# Patient Record
Sex: Male | Born: 1955 | Race: White | Hispanic: No | Marital: Married | Smoking: Former smoker
Health system: Southern US, Community
[De-identification: ages and names within clinical notes are randomized; demographics above are authoritative.]

## PROBLEM LIST (undated history)

## (undated) DIAGNOSIS — I429 Cardiomyopathy, unspecified: Secondary | ICD-10-CM

## (undated) DIAGNOSIS — I480 Paroxysmal atrial fibrillation: Secondary | ICD-10-CM

## (undated) DIAGNOSIS — E785 Hyperlipidemia, unspecified: Secondary | ICD-10-CM

## (undated) DIAGNOSIS — I714 Abdominal aortic aneurysm, without rupture, unspecified: Secondary | ICD-10-CM

## (undated) DIAGNOSIS — C3490 Malignant neoplasm of unspecified part of unspecified bronchus or lung: Secondary | ICD-10-CM

## (undated) DIAGNOSIS — I639 Cerebral infarction, unspecified: Secondary | ICD-10-CM

## (undated) DIAGNOSIS — J449 Chronic obstructive pulmonary disease, unspecified: Secondary | ICD-10-CM

## (undated) HISTORY — DX: Paroxysmal atrial fibrillation: I48.0

## (undated) HISTORY — PX: BRONCHOSCOPY: SUR163

## (undated) HISTORY — DX: Cardiomyopathy, unspecified: I42.9

## (undated) HISTORY — DX: Malignant neoplasm of unspecified part of unspecified bronchus or lung: C34.90

---

## 1977-12-15 HISTORY — PX: FINGER SURGERY: SHX640

## 2010-03-11 ENCOUNTER — Emergency Department (HOSPITAL_COMMUNITY): Admission: EM | Admit: 2010-03-11 | Discharge: 2010-03-11 | Payer: Self-pay | Admitting: Emergency Medicine

## 2011-03-10 LAB — URINALYSIS, ROUTINE W REFLEX MICROSCOPIC
Ketones, ur: NEGATIVE mg/dL
Protein, ur: NEGATIVE mg/dL
Specific Gravity, Urine: 1.005 (ref 1.005–1.030)
Urobilinogen, UA: 0.2 mg/dL (ref 0.0–1.0)

## 2012-08-12 NOTE — H&P (Signed)
  NTS SOAP Note  Vital Signs:  Vitals as of: 08/12/2012: Systolic 131: Diastolic 86: Heart Rate 73: Temp 98.80F: Height 72ft 5in: Weight 143Lbs 0 Ounces: BMI 24  BMI : 23.8 kg/m2  Subjective: This 62 Years 60 Months old Male presents for screening TCS.  Never has had a TCS.  No gi complaints.  No family h/o colon cancer.   Review of Symptoms:  Constitutional:unremarkable   Head:unremarkable    Eyes:unremarkable   Nose/Mouth/Throat:unremarkable Cardiovascular:  unremarkable   Respiratory:unremarkable   Gastrointestinal:  unremarkable   Genitourinary:unremarkable     Musculoskeletal:unremarkable   Skin:unremarkable Hematolgic/Lymphatic:unremarkable     Allergic/Immunologic:unremarkable     Past Medical History:    Reviewed   Past Medical History  Surgical History: unremarkable Medical Problems:  Asthma, CVA, high cholesterol Allergies: nkda Medications: plavix, lipitor, folic acid, spiriva   Social History:Reviewed  Social History  Preferred Language: English ([place]) Race:  White Ethnicity: Not Hispanic / Latino Age: 56 Years 7 Months Marital Status:  M Alcohol: occassional Recreational drug(s):  No   Smoking Status: Current every day smoker reviewed on 08/12/2012 Started Date: 12/15/1976 Packs per day: 1.00   Family History:  Reviewed   Family History  Is there a family history of:No family h/o colon cancer    Objective Information: General:  Well appearing, well nourished in no distress. Throat:  no erythema, exudates or lesions. Heart:  RRR, no murmur Lungs:    CTA bilaterally, no wheezes, rhonchi, rales.  Breathing unlabored. Abdomen:Soft, NT/ND, no HSM, no masses.   deferred to procedure  Assessment:Need for screening TCS  Diagnosis &amp; Procedure: DiagnosisCode: V76.51, ProcedureCode: [address],    Plan:Scheduled for TCS on 08/31/12.   Patient Education:Alternative  treatments to surgery were discussed with patient (and family).  Risks and benefits  of procedure were fully explained to the patient (and family) who gave informed consent. Patient/family questions were addressed.  Follow-up:Pending Surgery

## 2012-08-17 ENCOUNTER — Encounter (HOSPITAL_COMMUNITY): Payer: Self-pay | Admitting: Pharmacy Technician

## 2012-08-25 MED ORDER — LIDOCAINE HCL (PF) 1 % IJ SOLN
INTRAMUSCULAR | Status: AC
  Filled 2012-08-25: qty 5

## 2012-08-25 MED ORDER — PROPOFOL 10 MG/ML IV EMUL
INTRAVENOUS | Status: AC
  Filled 2012-08-25: qty 20

## 2012-08-31 ENCOUNTER — Encounter (HOSPITAL_COMMUNITY): Payer: Self-pay

## 2012-08-31 ENCOUNTER — Encounter (HOSPITAL_COMMUNITY)
Admission: RE | Disposition: A | Discharge status: Home / Self Care | Payer: Self-pay | Source: Ambulatory Visit | Attending: General Surgery

## 2012-08-31 ENCOUNTER — Ambulatory Visit (HOSPITAL_COMMUNITY)
Admission: RE | Admit: 2012-08-31 | Discharge: 2012-08-31 | Disposition: A | Discharge status: Home / Self Care | Payer: Medicare HMO | Source: Ambulatory Visit | Attending: General Surgery | Admitting: General Surgery

## 2012-08-31 DIAGNOSIS — D126 Benign neoplasm of colon, unspecified: Secondary | ICD-10-CM | POA: Insufficient documentation

## 2012-08-31 DIAGNOSIS — D129 Benign neoplasm of anus and anal canal: Secondary | ICD-10-CM | POA: Insufficient documentation

## 2012-08-31 DIAGNOSIS — D128 Benign neoplasm of rectum: Secondary | ICD-10-CM | POA: Insufficient documentation

## 2012-08-31 DIAGNOSIS — E7800 Pure hypercholesterolemia, unspecified: Secondary | ICD-10-CM | POA: Insufficient documentation

## 2012-08-31 DIAGNOSIS — K5730 Diverticulosis of large intestine without perforation or abscess without bleeding: Secondary | ICD-10-CM | POA: Insufficient documentation

## 2012-08-31 DIAGNOSIS — Z1211 Encounter for screening for malignant neoplasm of colon: Secondary | ICD-10-CM | POA: Insufficient documentation

## 2012-08-31 HISTORY — DX: Chronic obstructive pulmonary disease, unspecified: J44.9

## 2012-08-31 HISTORY — DX: Cerebral infarction, unspecified: I63.9

## 2012-08-31 HISTORY — PX: COLONOSCOPY: SHX5424

## 2012-08-31 SURGERY — COLONOSCOPY
Anesthesia: Moderate Sedation

## 2012-08-31 MED ORDER — MIDAZOLAM HCL 5 MG/5ML IJ SOLN
INTRAMUSCULAR | Status: AC
  Filled 2012-08-31: qty 10

## 2012-08-31 MED ORDER — MEPERIDINE HCL 50 MG/ML IJ SOLN
INTRAMUSCULAR | Status: AC
  Filled 2012-08-31: qty 2

## 2012-08-31 MED ORDER — STERILE WATER FOR IRRIGATION IR SOLN
Status: DC | PRN
  Administered 2012-08-31: 08:00:00

## 2012-08-31 MED ORDER — MIDAZOLAM HCL 5 MG/5ML IJ SOLN
INTRAMUSCULAR | Status: DC | PRN
  Administered 2012-08-31 (×2): 1 mg via INTRAVENOUS
  Administered 2012-08-31: 4 mg via INTRAVENOUS

## 2012-08-31 MED ORDER — MEPERIDINE HCL 25 MG/ML IJ SOLN
INTRAMUSCULAR | Status: DC | PRN
  Administered 2012-08-31: 50 mg via INTRAVENOUS

## 2012-08-31 NOTE — Op Note (Signed)
[facility] [address] [place], [address]   COLONOSCOPY PROCEDURE REPORT  PATIENT: Tyler Aguilar, Tyler Aguilar  MR#: [id number] BIRTHDATE: 05-19-1956 , 56  yrs. old GENDER: Male ENDOSCOPIST: Franky Macho, MD REFERRED [id number] PROCEDURE DATE:  08/31/2012 PROCEDURE:   Colonoscopy with snare polypectomy ASA CLASS:   Class II INDICATIONS:average risk patient for colon cancer. MEDICATIONS: Versed 6 mg IV and Demerol 50 mg IV  DESCRIPTION OF PROCEDURE:   After the risks benefits and alternatives of the procedure were thoroughly explained, informed consent was obtained.  A digital rectal exam revealed no abnormalities of the rectum.   The EC-[id number] ([id number])  endoscope was introduced through the anus and advanced to the cecum, which was identified by both the appendix and ileocecal valve. No adverse events experienced.   The quality of the prep was adequate, using MoviPrep  The instrument was then slowly withdrawn as the colon was fully examined.      COLON FINDINGS: Multiple medium sized polypoid shaped semi-pedunculated polyps were found at the cecum, in the sigmoid colon, and rectum.  Polypectomy was performed using hot snare.  All resections were complete and all polyp tissue was completely retrieved.   Diverticulosis  was found in the sigmoid colon.  The openings were medium sized.  Retroflexed views revealed no abnormalities except for rectal polyp. The time to cecum=4 minutes 0 seconds.  Withdrawal time=28 minutes 0 seconds.  The scope was withdrawn and the procedure completed. COMPLICATIONS: There were no complications.  ENDOSCOPIC IMPRESSION: 1.   Multiple medium sized semi-pedunculated polyps were found at the cecum, in the sigmoid colon, and rectum; Polypectomy was performed using hot snare 2.   Diverticulum in the sigmoid colon  RECOMMENDATIONS: 1.  await pathology results 2.  repeat Colonoscopy in 3 years.   eSigned:  Franky Macho, MD 08/31/2012  9:28 AM   cc:   PATIENT NAME:  Tyler Aguilar, Tyler Aguilar MR#: [id number]

## 2012-08-31 NOTE — Interval H&P Note (Signed)
History and Physical Interval Note:  08/31/2012 8:37 AM  Tyler Aguilar  has presented today for surgery, with the diagnosis of Special screening for malignant neoplasms, colon  The various methods of treatment have been discussed with the patient and family. After consideration of risks, benefits and other options for treatment, the patient has consented to  Procedure(s) (LRB) with comments: COLONOSCOPY (N/A) as a surgical intervention .  The patient's history has been reviewed, patient examined, no change in status, stable for surgery.  I have reviewed the patient's chart and labs.  Questions were answered to the patient's satisfaction.     [name]

## 2012-09-03 ENCOUNTER — Encounter (HOSPITAL_COMMUNITY): Payer: Self-pay | Admitting: General Surgery

## 2013-04-20 DIAGNOSIS — I639 Cerebral infarction, unspecified: Secondary | ICD-10-CM | POA: Insufficient documentation

## 2013-04-20 DIAGNOSIS — R252 Cramp and spasm: Secondary | ICD-10-CM | POA: Insufficient documentation

## 2014-04-28 DIAGNOSIS — I70209 Unspecified atherosclerosis of native arteries of extremities, unspecified extremity: Secondary | ICD-10-CM | POA: Insufficient documentation

## 2014-04-28 DIAGNOSIS — Z720 Tobacco use: Secondary | ICD-10-CM | POA: Insufficient documentation

## 2014-04-28 DIAGNOSIS — I714 Abdominal aortic aneurysm, without rupture, unspecified: Secondary | ICD-10-CM | POA: Insufficient documentation

## 2015-05-04 DIAGNOSIS — I6529 Occlusion and stenosis of unspecified carotid artery: Secondary | ICD-10-CM | POA: Insufficient documentation

## 2015-06-05 DIAGNOSIS — R251 Tremor, unspecified: Secondary | ICD-10-CM | POA: Insufficient documentation

## 2016-01-02 ENCOUNTER — Encounter (HOSPITAL_COMMUNITY): Payer: Self-pay | Admitting: *Deleted

## 2016-01-02 ENCOUNTER — Inpatient Hospital Stay (HOSPITAL_COMMUNITY)
Admission: EM | Admit: 2016-01-02 | Discharge: 2016-01-04 | DRG: 190 | Disposition: A | Discharge status: Home / Self Care | Payer: Medicare PPO | Attending: Internal Medicine | Admitting: Internal Medicine

## 2016-01-02 ENCOUNTER — Emergency Department (HOSPITAL_COMMUNITY): Payer: Medicare PPO

## 2016-01-02 DIAGNOSIS — I714 Abdominal aortic aneurysm, without rupture: Secondary | ICD-10-CM | POA: Diagnosis present

## 2016-01-02 DIAGNOSIS — R748 Abnormal levels of other serum enzymes: Secondary | ICD-10-CM | POA: Diagnosis present

## 2016-01-02 DIAGNOSIS — C3490 Malignant neoplasm of unspecified part of unspecified bronchus or lung: Secondary | ICD-10-CM | POA: Diagnosis present

## 2016-01-02 DIAGNOSIS — E785 Hyperlipidemia, unspecified: Secondary | ICD-10-CM | POA: Diagnosis present

## 2016-01-02 DIAGNOSIS — Z7902 Long term (current) use of antithrombotics/antiplatelets: Secondary | ICD-10-CM

## 2016-01-02 DIAGNOSIS — J441 Chronic obstructive pulmonary disease with (acute) exacerbation: Principal | ICD-10-CM | POA: Diagnosis present

## 2016-01-02 DIAGNOSIS — F419 Anxiety disorder, unspecified: Secondary | ICD-10-CM | POA: Diagnosis present

## 2016-01-02 DIAGNOSIS — R7989 Other specified abnormal findings of blood chemistry: Secondary | ICD-10-CM | POA: Diagnosis present

## 2016-01-02 DIAGNOSIS — J9601 Acute respiratory failure with hypoxia: Secondary | ICD-10-CM | POA: Diagnosis present

## 2016-01-02 DIAGNOSIS — J9859 Other diseases of mediastinum, not elsewhere classified: Secondary | ICD-10-CM | POA: Diagnosis present

## 2016-01-02 DIAGNOSIS — R590 Localized enlarged lymph nodes: Secondary | ICD-10-CM | POA: Diagnosis present

## 2016-01-02 DIAGNOSIS — R0602 Shortness of breath: Secondary | ICD-10-CM

## 2016-01-02 DIAGNOSIS — R778 Other specified abnormalities of plasma proteins: Secondary | ICD-10-CM | POA: Diagnosis present

## 2016-01-02 DIAGNOSIS — I739 Peripheral vascular disease, unspecified: Secondary | ICD-10-CM | POA: Diagnosis present

## 2016-01-02 DIAGNOSIS — F17200 Nicotine dependence, unspecified, uncomplicated: Secondary | ICD-10-CM | POA: Diagnosis present

## 2016-01-02 DIAGNOSIS — Z79899 Other long term (current) drug therapy: Secondary | ICD-10-CM

## 2016-01-02 DIAGNOSIS — Z8673 Personal history of transient ischemic attack (TIA), and cerebral infarction without residual deficits: Secondary | ICD-10-CM

## 2016-01-02 DIAGNOSIS — X58XXXA Exposure to other specified factors, initial encounter: Secondary | ICD-10-CM | POA: Diagnosis present

## 2016-01-02 DIAGNOSIS — I4510 Unspecified right bundle-branch block: Secondary | ICD-10-CM | POA: Diagnosis present

## 2016-01-02 DIAGNOSIS — T17990A Other foreign object in respiratory tract, part unspecified in causing asphyxiation, initial encounter: Secondary | ICD-10-CM | POA: Diagnosis present

## 2016-01-02 HISTORY — DX: Abdominal aortic aneurysm, without rupture: I71.4

## 2016-01-02 HISTORY — DX: Abdominal aortic aneurysm, without rupture, unspecified: I71.40

## 2016-01-02 HISTORY — DX: Hyperlipidemia, unspecified: E78.5

## 2016-01-02 LAB — CBC WITH DIFFERENTIAL/PLATELET
BASOS ABS: 0 10*3/uL (ref 0.0–0.1)
Basophils Relative: 0 %
EOS PCT: 3 %
Eosinophils Absolute: 0.3 10*3/uL (ref 0.0–0.7)
HEMATOCRIT: 40.3 % (ref 39.0–52.0)
Hemoglobin: 13.4 g/dL (ref 13.0–17.0)
LYMPHS PCT: 28 %
Lymphs Abs: 2.8 10*3/uL (ref 0.7–4.0)
MCH: 32.2 pg (ref 26.0–34.0)
MCHC: 33.3 g/dL (ref 30.0–36.0)
MCV: 96.9 fL (ref 78.0–100.0)
Monocytes Absolute: 1 10*3/uL (ref 0.1–1.0)
Monocytes Relative: 10 %
NEUTROS ABS: 6.2 10*3/uL (ref 1.7–7.7)
Neutrophils Relative %: 59 %
PLATELETS: 268 10*3/uL (ref 150–400)
RBC: 4.16 MIL/uL — ABNORMAL LOW (ref 4.22–5.81)
RDW: 13 % (ref 11.5–15.5)
WBC: 10.3 10*3/uL (ref 4.0–10.5)

## 2016-01-02 MED ORDER — IPRATROPIUM-ALBUTEROL 0.5-2.5 (3) MG/3ML IN SOLN
3.0000 mL | Freq: Once | RESPIRATORY_TRACT | Status: AC
  Administered 2016-01-03: 3 mL via RESPIRATORY_TRACT
  Filled 2016-01-02: qty 3

## 2016-01-02 MED ORDER — PREDNISONE 50 MG PO TABS
60.0000 mg | ORAL_TABLET | Freq: Once | ORAL | Status: AC
  Administered 2016-01-02: 60 mg via ORAL
  Filled 2016-01-02: qty 1

## 2016-01-02 NOTE — ED Notes (Signed)
Pt c/o sob; pt using accessory muscles;

## 2016-01-02 NOTE — ED Provider Notes (Signed)
CSN: [id number]     Arrival date & time 01/02/16  2139 History  By signing my name below, I, Arianna Nassar, attest that this documentation has been prepared under the direction and in the presence of Merryl Hacker, MD. Electronically Signed: Julien Nordmann, ED Scribe. 01/02/2016. 11:23 PM.    Chief Complaint  Patient presents with  . Shortness of Breath     The history is provided by the patient. No language interpreter was used.   HPI Comments: Tyler Aguilar is a 60 y.o. male who has a hx of COPD and CVA presents to the Emergency Department complaining of sudden onset, intermittent, gradual worsening shortness of breath that started this afternoon. Pt had a bronchoscopy done yesterday and states having multiple episodes of being short of breath that started this afternoon. Pt says he received oxygen from first responders that helped him but he had another episode of being short of breath. He reports using a breathing treatment to alleviate his symptoms with minimal relief. He denies any chest pain. He currently takes Plavix and Lipitor. Pt denies recent fever and chest pain. He has no known drug allergies.  I have reviewed the patient's chart. Had a bronchoscopy yesterday for a mediastinal mass. Followed primarily at [facility].  Past Medical History  Diagnosis Date  . Stroke [facility])     May 02, 2008  . COPD (chronic obstructive pulmonary disease) [facility])    Past Surgical History  Procedure Laterality Date  . Finger surgery  1979    right side  . Colonoscopy  08/31/2012    Procedure: COLONOSCOPY;  Surgeon: Jamesetta So, MD;  Location: [facility];  Service: Gastroenterology;  Laterality: N/A;  . Bronchoscopy     No family history on file. Social History  Substance Use Topics  . Smoking status: Current Every Day Smoker  . Smokeless tobacco: Not on file  . Alcohol Use: Yes    Review of Systems  Constitutional: Negative for fever.  Respiratory: Positive for cough and  shortness of breath.   Cardiovascular: Negative for chest pain and leg swelling.  Gastrointestinal: Negative for abdominal pain.  All other systems reviewed and are negative.     Allergies  Review of patient's allergies indicates no known allergies.  Home Medications   Prior to Admission medications   Medication Sig Start Date End Date Taking? Authorizing Provider  acetaminophen (TYLENOL) 500 MG tablet Take 1,000 mg by mouth every 6 (six) hours as needed for mild pain, moderate pain or headache.   Yes Historical Provider, MD  albuterol (PROVENTIL HFA;VENTOLIN HFA) 108 (90 Base) MCG/ACT inhaler Inhale 2 puffs into the lungs every 4 (four) hours as needed for wheezing or shortness of breath.   Yes Historical Provider, MD  atorvastatin (LIPITOR) 80 MG tablet Take 80 mg by mouth every evening.   Yes Historical Provider, MD  budesonide-formoterol (SYMBICORT) 160-4.5 MCG/ACT inhaler Inhale 2 puffs into the lungs 2 (two) times daily.   Yes Historical Provider, MD  cholecalciferol (VITAMIN D) 1000 units tablet Take 1,000 Units by mouth daily.   Yes Historical Provider, MD  clopidogrel (PLAVIX) 75 MG tablet Take 75 mg by mouth daily.   Yes Historical Provider, MD  cyanocobalamin 500 MCG tablet Take 500 mcg by mouth daily.   Yes Historical Provider, MD  folic acid (FOLVITE) 169 MCG tablet Take 400 mcg by mouth daily.   Yes Historical Provider, MD  propranolol (INDERAL) 20 MG tablet Take 20 mg by mouth 3 (three)  times daily.   Yes Historical Provider, MD  pyridOXINE (VITAMIN B-6) 50 MG tablet Take 50 mg by mouth daily.   Yes Historical Provider, MD  sildenafil (VIAGRA) 25 MG tablet Take 25 mg by mouth daily as needed. For erectile dysfunction 12/03/15 01/02/16 Yes Historical Provider, MD  tiotropium (SPIRIVA) 18 MCG inhalation capsule Place 18 mcg into inhaler and inhale every evening.   Yes Historical Provider, MD   Triage vitals: BP 117/78 mmHg  Pulse 73  Resp 27  SpO2 99% Physical Exam   Constitutional: He is oriented to person, place, and time.  Chronically ill-appearing, no respiratory distress, audible wheezing  HENT:  Head: Normocephalic and atraumatic.  Eyes: Pupils are equal, round, and reactive to light.  Cardiovascular: Normal rate, regular rhythm and normal heart sounds.   No murmur heard. Pulmonary/Chest: Effort normal. No respiratory distress. He has wheezes.  Diffuse expiratory wheezing, good air movement, no acute respiratory distress  Abdominal: Soft. Bowel sounds are normal. There is no tenderness. There is no rebound.  Musculoskeletal: He exhibits no edema.  Neurological: He is alert and oriented to person, place, and time.  Skin: Skin is warm and dry.  Psychiatric: He has a normal mood and affect.  Nursing note and vitals reviewed.   ED Course  Procedures  DIAGNOSTIC STUDIES: Oxygen Saturation is 99% on RA, normal by my interpretation.  COORDINATION OF CARE:  11:21 PM Discussed treatment plan which includes nebulizer treatment and steroids with pt at bedside and pt agreed to plan.  Labs Review Labs Reviewed  CBC WITH DIFFERENTIAL/PLATELET - Abnormal; Notable for the following:    RBC 4.16 (*)    All other components within normal limits  BASIC METABOLIC PANEL - Abnormal; Notable for the following:    Glucose, Bld 112 (*)    All other components within normal limits  TROPONIN I - Abnormal; Notable for the following:    Troponin I 0.04 (*)    All other components within normal limits    Imaging Review Dg Chest 2 View  01/02/2016  CLINICAL DATA:  60 year old male with intermittent severe shortness of breath and wheezing tonight. Recent history of bronchoscopy yesterday. EXAM: CHEST  2 VIEW COMPARISON:  No priors. FINDINGS: Mild diffuse peribronchial cuffing. Lung volumes are normal. No consolidative airspace disease. No pleural effusions. No pneumothorax. No pulmonary nodule or mass noted. Pulmonary vasculature and the cardiomediastinal  silhouette are within normal limits. Atherosclerosis in the thoracic aorta. IMPRESSION: 1. Mild diffuse peribronchial cuffing, suggesting an acute bronchitis. 2. Atherosclerosis. Electronically Signed   By: Vinnie Langton M.D.   On: 01/02/2016 22:13   Ct Angio Chest Pe W/cm &/or Wo Cm  01/03/2016  CLINICAL DATA:  Sudden onset intermittent worsening shortness of breath beginning this afternoon. Status post bronchoscopy yesterday. History of COPD and stroke. EXAM: CT ANGIOGRAPHY CHEST WITH CONTRAST TECHNIQUE: Multidetector CT imaging of the chest was performed using the standard protocol during bolus administration of intravenous contrast. Multiplanar CT image reconstructions and MIPs were obtained to evaluate the vascular anatomy. CONTRAST:  172m OMNIPAQUE IOHEXOL 350 MG/ML SOLN COMPARISON:  Chest radiograph January 02, 2016 FINDINGS: PULMONARY ARTERY: Inadequate pulmonary arterial contrast opacification, 140 Hounsfield units, target is 250 Hounsfield units. Main pulmonary artery is not enlarged. No central pulmonary arterial filling defects. MEDIASTINUM: Hypo enhancing 4.6 x 4.7 x 7.3 cm (AP by transverse by CC) sub carinal mass, moderately narrowing the bilateral mainstem bronchi which remains patent. Soft tissue effaces multiple lower lobe segmental bronchi, however patent subsegmental  bronchi. Heart size is normal. Moderate coronary artery calcifications. Moderate calcific atherosclerosis of the aortic arch. LUNGS: Tracheobronchial tree is patent, no pneumothorax. Moderate centrilobular emphysema. Small faint ground-glass opacity RIGHT upper lobe. No pleural effusions, focal consolidations, parenchymal masses. SOFT TISSUES AND OSSEOUS STRUCTURES: Included view of the abdomen is unremarkable. Visualized soft tissues and included osseous structures appear normal. Review of the MIP images confirms the above findings. IMPRESSION: Suboptimal examination without central pulmonary embolism, on this delayed phase  examination. 4.6 x 4.7 x 7.3 cm sub carinal/mediastinal mass moderately narrowing the bilateral mainstem bronchi which remains patent. Differential diagnosis includes infectious or inflammatory lymphadenopathy, lymphoma, metastasis. Mucoid impaction within multiple lower lobe segmental bronchi. Faint RIGHT upper lobe ground-glass opacity may be infectious or inflammatory. Moderate centrilobular emphysema. Electronically Signed   By: Elon Alas M.D.   On: 01/03/2016 02:04   I have personally reviewed and evaluated these images and lab results as part of my medical decision-making.   EKG Interpretation   Date/Time:  Wednesday January 02 2016 22:26:41 EST Ventricular Rate:  82 PR Interval:  168 QRS Duration: 142 QT Interval:  365 QTC Calculation: 426 R Axis:   87 Text Interpretation:  Sinus rhythm Multiple premature complexes, vent &  supraven Probable left atrial enlargement Right bundle branch block NO  prior for comparison Confirmed by HORTON  MD, COURTNEY ([id number]) on  01/02/2016 11:02:08 PM      MDM   Final diagnoses:  Chronic obstructive pulmonary disease with acute exacerbation (HCC)  Elevated troponin    Patient presents with shortness of breath. History of COPD. Also recent history of procedure. This would put him at risk for pneumothorax. He is in no respiratory distress at this time. EKG shows a right bundle branch block with right atrial enlargement and multiple PVCs. No prior for comparison. EKG from outside hospital in 2002 was normal. Unclear if this is a change from prior. He is currently without chest pain. Chest x-ray shows no evidence of pneumothorax.  Basic labwork is reassuring. Patient was given a duo neb and prednisone for presumed bronchospasm and COPD exacerbation. CT angiogram of the chest was obtained to obtain better visualization of the lungs and to rule out PE. CT angios shows known mediastinal mass. No obvious PE. Of note, troponin is minimally elevated at  0.04. Patient was given a full dose aspirin. He does have risk factors for ACS; however, his presentation is much more consistent with a COPD exacerbation and he is currently chest pain-free.  I feel patient is appropriate for admission at [facility] for serial enzymes and treatment of acute COPD exacerbation.  Discussed with cardiology at [facility], Dr. Philbert Riser, who is in agreement. Patient can be seen by cardiology at [facility] if there are any further concerns or troponins increase. Discussed with Dr. Hal Hope.  I personally performed the services described in this documentation, which was scribed in my presence. The recorded information has been reviewed and is accurate.    Merryl Hacker, MD 01/03/16 [id number]

## 2016-01-03 ENCOUNTER — Emergency Department (HOSPITAL_COMMUNITY): Payer: Medicare PPO

## 2016-01-03 ENCOUNTER — Inpatient Hospital Stay (HOSPITAL_COMMUNITY): Payer: Medicare PPO

## 2016-01-03 ENCOUNTER — Encounter (HOSPITAL_COMMUNITY): Payer: Self-pay | Admitting: *Deleted

## 2016-01-03 DIAGNOSIS — I739 Peripheral vascular disease, unspecified: Secondary | ICD-10-CM | POA: Diagnosis present

## 2016-01-03 DIAGNOSIS — R0600 Dyspnea, unspecified: Secondary | ICD-10-CM | POA: Diagnosis not present

## 2016-01-03 DIAGNOSIS — R748 Abnormal levels of other serum enzymes: Secondary | ICD-10-CM | POA: Diagnosis present

## 2016-01-03 DIAGNOSIS — E785 Hyperlipidemia, unspecified: Secondary | ICD-10-CM | POA: Diagnosis present

## 2016-01-03 DIAGNOSIS — J9601 Acute respiratory failure with hypoxia: Secondary | ICD-10-CM | POA: Diagnosis present

## 2016-01-03 DIAGNOSIS — R7989 Other specified abnormal findings of blood chemistry: Secondary | ICD-10-CM | POA: Diagnosis not present

## 2016-01-03 DIAGNOSIS — I4510 Unspecified right bundle-branch block: Secondary | ICD-10-CM | POA: Diagnosis present

## 2016-01-03 DIAGNOSIS — I714 Abdominal aortic aneurysm, without rupture: Secondary | ICD-10-CM | POA: Diagnosis present

## 2016-01-03 DIAGNOSIS — F17200 Nicotine dependence, unspecified, uncomplicated: Secondary | ICD-10-CM | POA: Diagnosis present

## 2016-01-03 DIAGNOSIS — Z8673 Personal history of transient ischemic attack (TIA), and cerebral infarction without residual deficits: Secondary | ICD-10-CM | POA: Diagnosis not present

## 2016-01-03 DIAGNOSIS — J449 Chronic obstructive pulmonary disease, unspecified: Secondary | ICD-10-CM | POA: Insufficient documentation

## 2016-01-03 DIAGNOSIS — R0602 Shortness of breath: Secondary | ICD-10-CM | POA: Diagnosis present

## 2016-01-03 DIAGNOSIS — F419 Anxiety disorder, unspecified: Secondary | ICD-10-CM | POA: Diagnosis present

## 2016-01-03 DIAGNOSIS — J9600 Acute respiratory failure, unspecified whether with hypoxia or hypercapnia: Secondary | ICD-10-CM | POA: Insufficient documentation

## 2016-01-03 DIAGNOSIS — R590 Localized enlarged lymph nodes: Secondary | ICD-10-CM | POA: Diagnosis present

## 2016-01-03 DIAGNOSIS — C3490 Malignant neoplasm of unspecified part of unspecified bronchus or lung: Secondary | ICD-10-CM | POA: Diagnosis present

## 2016-01-03 DIAGNOSIS — Z79899 Other long term (current) drug therapy: Secondary | ICD-10-CM | POA: Diagnosis not present

## 2016-01-03 DIAGNOSIS — Z7902 Long term (current) use of antithrombotics/antiplatelets: Secondary | ICD-10-CM | POA: Diagnosis not present

## 2016-01-03 DIAGNOSIS — J441 Chronic obstructive pulmonary disease with (acute) exacerbation: Secondary | ICD-10-CM | POA: Diagnosis present

## 2016-01-03 DIAGNOSIS — X58XXXA Exposure to other specified factors, initial encounter: Secondary | ICD-10-CM | POA: Diagnosis present

## 2016-01-03 DIAGNOSIS — R778 Other specified abnormalities of plasma proteins: Secondary | ICD-10-CM | POA: Diagnosis present

## 2016-01-03 DIAGNOSIS — T17990A Other foreign object in respiratory tract, part unspecified in causing asphyxiation, initial encounter: Secondary | ICD-10-CM | POA: Diagnosis present

## 2016-01-03 DIAGNOSIS — J9859 Other diseases of mediastinum, not elsewhere classified: Secondary | ICD-10-CM | POA: Diagnosis present

## 2016-01-03 LAB — TROPONIN I
TROPONIN I: 0.04 ng/mL — AB (ref <0.031)
TROPONIN I: 0.03 ng/mL (ref <0.031)
TROPONIN I: 0.04 ng/mL — AB (ref <0.031)
Troponin I: 0.04 ng/mL — ABNORMAL HIGH (ref <0.031)

## 2016-01-03 LAB — BASIC METABOLIC PANEL
ANION GAP: 8 (ref 5–15)
BUN: 13 mg/dL (ref 6–20)
CO2: 27 mmol/L (ref 22–32)
Calcium: 9.2 mg/dL (ref 8.9–10.3)
Chloride: 101 mmol/L (ref 101–111)
Creatinine, Ser: 0.9 mg/dL (ref 0.61–1.24)
GLUCOSE: 112 mg/dL — AB (ref 65–99)
POTASSIUM: 4.7 mmol/L (ref 3.5–5.1)
Sodium: 136 mmol/L (ref 135–145)

## 2016-01-03 LAB — CBC WITH DIFFERENTIAL/PLATELET
BASOS ABS: 0 10*3/uL (ref 0.0–0.1)
BASOS PCT: 0 %
Eosinophils Absolute: 0 10*3/uL (ref 0.0–0.7)
Eosinophils Relative: 0 %
HEMATOCRIT: 41.5 % (ref 39.0–52.0)
HEMOGLOBIN: 13.9 g/dL (ref 13.0–17.0)
Lymphocytes Relative: 12 %
Lymphs Abs: 1.2 10*3/uL (ref 0.7–4.0)
MCH: 32.3 pg (ref 26.0–34.0)
MCHC: 33.5 g/dL (ref 30.0–36.0)
MCV: 96.3 fL (ref 78.0–100.0)
Monocytes Absolute: 0.3 10*3/uL (ref 0.1–1.0)
Monocytes Relative: 3 %
NEUTROS ABS: 8.5 10*3/uL — AB (ref 1.7–7.7)
NEUTROS PCT: 85 %
Platelets: 289 10*3/uL (ref 150–400)
RBC: 4.31 MIL/uL (ref 4.22–5.81)
RDW: 13 % (ref 11.5–15.5)
WBC: 9.9 10*3/uL (ref 4.0–10.5)

## 2016-01-03 LAB — COMPREHENSIVE METABOLIC PANEL
ALBUMIN: 3.5 g/dL (ref 3.5–5.0)
ALT: 53 U/L (ref 17–63)
AST: 38 U/L (ref 15–41)
Alkaline Phosphatase: 99 U/L (ref 38–126)
Anion gap: 9 (ref 5–15)
BUN: 11 mg/dL (ref 6–20)
CHLORIDE: 103 mmol/L (ref 101–111)
CO2: 26 mmol/L (ref 22–32)
CREATININE: 0.75 mg/dL (ref 0.61–1.24)
Calcium: 9.3 mg/dL (ref 8.9–10.3)
GFR calc Af Amer: >60 mL/min (ref >60)
GLUCOSE: 169 mg/dL — AB (ref 65–99)
POTASSIUM: 4.6 mmol/L (ref 3.5–5.1)
Sodium: 138 mmol/L (ref 135–145)
TOTAL PROTEIN: 6.8 g/dL (ref 6.5–8.1)
Total Bilirubin: 0.7 mg/dL (ref 0.3–1.2)

## 2016-01-03 LAB — GLUCOSE, CAPILLARY: GLUCOSE-CAPILLARY: 176 mg/dL — AB (ref 65–99)

## 2016-01-03 MED ORDER — IPRATROPIUM BROMIDE 0.02 % IN SOLN
RESPIRATORY_TRACT | Status: AC
  Filled 2016-01-03: qty 2.5

## 2016-01-03 MED ORDER — ONDANSETRON HCL 4 MG PO TABS: 4.0000 mg | ORAL_TABLET | Freq: Four times a day (QID) | ORAL | Status: DC | PRN

## 2016-01-03 MED ORDER — LORAZEPAM 1 MG PO TABS
1.0000 mg | ORAL_TABLET | Freq: Three times a day (TID) | ORAL | Status: DC
  Administered 2016-01-03 – 2016-01-04 (×3): 1 mg via ORAL
  Filled 2016-01-03 (×3): qty 1

## 2016-01-03 MED ORDER — IOHEXOL 350 MG/ML SOLN
100.0000 mL | Freq: Once | INTRAVENOUS | Status: AC | PRN
  Administered 2016-01-03: 100 mL via INTRAVENOUS

## 2016-01-03 MED ORDER — RACEPINEPHRINE HCL 2.25 % IN NEBU: 0.5000 mL | INHALATION_SOLUTION | RESPIRATORY_TRACT | Status: DC | PRN

## 2016-01-03 MED ORDER — GUAIFENESIN ER 600 MG PO TB12
600.0000 mg | ORAL_TABLET | Freq: Two times a day (BID) | ORAL | Status: DC
  Administered 2016-01-03 – 2016-01-04 (×3): 600 mg via ORAL
  Filled 2016-01-03 (×4): qty 1

## 2016-01-03 MED ORDER — ENOXAPARIN SODIUM 40 MG/0.4ML ~~LOC~~ SOLN
40.0000 mg | SUBCUTANEOUS | Status: DC
  Administered 2016-01-03 – 2016-01-04 (×2): 40 mg via SUBCUTANEOUS
  Filled 2016-01-03 (×2): qty 0.4

## 2016-01-03 MED ORDER — ALBUTEROL SULFATE (2.5 MG/3ML) 0.083% IN NEBU: 2.5000 mg | INHALATION_SOLUTION | RESPIRATORY_TRACT | Status: DC

## 2016-01-03 MED ORDER — ALBUTEROL SULFATE (2.5 MG/3ML) 0.083% IN NEBU
2.5000 mg | INHALATION_SOLUTION | Freq: Once | RESPIRATORY_TRACT | Status: AC
  Administered 2016-01-03: 2.5 mg via RESPIRATORY_TRACT

## 2016-01-03 MED ORDER — ACETAMINOPHEN 325 MG PO TABS: 650.0000 mg | ORAL_TABLET | Freq: Four times a day (QID) | ORAL | Status: DC | PRN

## 2016-01-03 MED ORDER — METHYLPREDNISOLONE SODIUM SUCC 40 MG IJ SOLR
40.0000 mg | Freq: Four times a day (QID) | INTRAMUSCULAR | Status: DC
  Administered 2016-01-03 – 2016-01-04 (×4): 40 mg via INTRAVENOUS
  Filled 2016-01-03 (×4): qty 1

## 2016-01-03 MED ORDER — VITAMIN D 1000 UNITS PO TABS
1000.0000 [IU] | ORAL_TABLET | Freq: Every day | ORAL | Status: DC
  Administered 2016-01-03 – 2016-01-04 (×2): 1000 [IU] via ORAL
  Filled 2016-01-03 (×2): qty 1

## 2016-01-03 MED ORDER — ASPIRIN 325 MG PO TABS
325.0000 mg | ORAL_TABLET | Freq: Once | ORAL | Status: AC
  Administered 2016-01-03: 325 mg via ORAL
  Filled 2016-01-03: qty 1

## 2016-01-03 MED ORDER — IPRATROPIUM-ALBUTEROL 0.5-2.5 (3) MG/3ML IN SOLN
3.0000 mL | RESPIRATORY_TRACT | Status: DC
  Administered 2016-01-03 (×2): 3 mL via RESPIRATORY_TRACT
  Filled 2016-01-03 (×2): qty 3

## 2016-01-03 MED ORDER — VANCOMYCIN HCL IN DEXTROSE 1-5 GM/200ML-% IV SOLN
1000.0000 mg | Freq: Once | INTRAVENOUS | Status: AC
  Administered 2016-01-03: 1000 mg via INTRAVENOUS
  Filled 2016-01-03: qty 200

## 2016-01-03 MED ORDER — PIPERACILLIN-TAZOBACTAM 3.375 G IVPB
3.3750 g | Freq: Three times a day (TID) | INTRAVENOUS | Status: DC
  Administered 2016-01-03 – 2016-01-04 (×4): 3.375 g via INTRAVENOUS
  Filled 2016-01-03 (×3): qty 50

## 2016-01-03 MED ORDER — IPRATROPIUM-ALBUTEROL 0.5-2.5 (3) MG/3ML IN SOLN: 3.0000 mL | RESPIRATORY_TRACT | Status: DC

## 2016-01-03 MED ORDER — VITAMIN B-12 1000 MCG PO TABS
500.0000 ug | ORAL_TABLET | Freq: Every day | ORAL | Status: DC
  Administered 2016-01-03: 500 ug via ORAL
  Administered 2016-01-04: 11:00:00 via ORAL
  Filled 2016-01-03 (×2): qty 1

## 2016-01-03 MED ORDER — ACETAMINOPHEN 650 MG RE SUPP: 650.0000 mg | Freq: Four times a day (QID) | RECTAL | Status: DC | PRN

## 2016-01-03 MED ORDER — IPRATROPIUM BROMIDE 0.02 % IN SOLN
0.5000 mg | Freq: Once | RESPIRATORY_TRACT | Status: AC
  Administered 2016-01-03: 0.5 mg via RESPIRATORY_TRACT

## 2016-01-03 MED ORDER — VANCOMYCIN HCL IN DEXTROSE 750-5 MG/150ML-% IV SOLN
750.0000 mg | Freq: Two times a day (BID) | INTRAVENOUS | Status: DC
  Administered 2016-01-03 – 2016-01-04 (×2): 750 mg via INTRAVENOUS
  Filled 2016-01-03 (×8): qty 150

## 2016-01-03 MED ORDER — ONDANSETRON HCL 4 MG/2ML IJ SOLN: 4.0000 mg | Freq: Four times a day (QID) | INTRAMUSCULAR | Status: DC | PRN

## 2016-01-03 MED ORDER — CLOPIDOGREL BISULFATE 75 MG PO TABS
75.0000 mg | ORAL_TABLET | Freq: Every day | ORAL | Status: DC
  Administered 2016-01-03 – 2016-01-04 (×2): 75 mg via ORAL
  Filled 2016-01-03 (×2): qty 1

## 2016-01-03 MED ORDER — ALBUTEROL SULFATE (2.5 MG/3ML) 0.083% IN NEBU
2.5000 mg | INHALATION_SOLUTION | RESPIRATORY_TRACT | Status: DC | PRN
  Administered 2016-01-03: 2.5 mg via RESPIRATORY_TRACT
  Filled 2016-01-03: qty 3

## 2016-01-03 MED ORDER — BUDESONIDE 0.25 MG/2ML IN SUSP
0.2500 mg | Freq: Two times a day (BID) | RESPIRATORY_TRACT | Status: DC
  Administered 2016-01-03 – 2016-01-04 (×3): 0.25 mg via RESPIRATORY_TRACT
  Filled 2016-01-03 (×3): qty 2

## 2016-01-03 MED ORDER — PROPRANOLOL HCL 20 MG PO TABS
20.0000 mg | ORAL_TABLET | Freq: Three times a day (TID) | ORAL | Status: DC
  Administered 2016-01-03 (×3): 20 mg via ORAL
  Filled 2016-01-03 (×4): qty 1

## 2016-01-03 MED ORDER — SODIUM CHLORIDE 0.9 % IV SOLN
INTRAVENOUS | Status: AC
  Administered 2016-01-03: 06:00:00 via INTRAVENOUS

## 2016-01-03 MED ORDER — ATORVASTATIN CALCIUM 40 MG PO TABS
80.0000 mg | ORAL_TABLET | Freq: Every evening | ORAL | Status: DC
  Administered 2016-01-03: 80 mg via ORAL
  Filled 2016-01-03 (×2): qty 2

## 2016-01-03 MED ORDER — IPRATROPIUM BROMIDE 0.02 % IN SOLN: 0.5000 mg | RESPIRATORY_TRACT | Status: DC

## 2016-01-03 MED ORDER — HYDROCODONE-HOMATROPINE 5-1.5 MG/5ML PO SYRP
5.0000 mL | ORAL_SOLUTION | Freq: Four times a day (QID) | ORAL | Status: DC
  Administered 2016-01-03 – 2016-01-04 (×3): 5 mL via ORAL
  Filled 2016-01-03 (×4): qty 5

## 2016-01-03 MED ORDER — VITAMIN B-6 50 MG PO TABS
50.0000 mg | ORAL_TABLET | Freq: Every day | ORAL | Status: DC
  Administered 2016-01-03 – 2016-01-04 (×2): 50 mg via ORAL
  Filled 2016-01-03 (×2): qty 1

## 2016-01-03 MED ORDER — FOLIC ACID 1 MG PO TABS
500.0000 ug | ORAL_TABLET | Freq: Every day | ORAL | Status: DC
  Administered 2016-01-03 – 2016-01-04 (×2): 0.5 mg via ORAL
  Filled 2016-01-03 (×4): qty 1

## 2016-01-03 MED ORDER — LORAZEPAM 0.5 MG PO TABS: 0.5000 mg | ORAL_TABLET | Freq: Four times a day (QID) | ORAL | Status: DC | PRN

## 2016-01-03 MED ORDER — ALBUTEROL SULFATE (2.5 MG/3ML) 0.083% IN NEBU
INHALATION_SOLUTION | RESPIRATORY_TRACT | Status: AC
  Filled 2016-01-03: qty 3

## 2016-01-03 NOTE — Consult Note (Signed)
Consult requested by: [facility] Consult requested for respiratory failure:  HPI: This is a 60 year old with a long known history of COPD and who had bronchoscopy done at [facility] 2 days ago. He was told that he would not have results available until about the 20th or the 23rd. He said they did do a biopsy. He had been doing fairly well with some shortness of breath with his known COPD but he continued to have trouble with breathing for the last 24 hours. He's been coughing and congested and short of breath and he's been more anxious. He says he feels a little bit better this morning.  Past Medical History  Diagnosis Date  . Stroke [facility])     May 02, 2008  . COPD (chronic obstructive pulmonary disease) ([place])   . AAA (abdominal aortic aneurysm) (HCC)      Family History  Problem Relation Age of Onset  . Diabetes Mellitus II Mother   . Cancer Mother   . Cancer Father      Social History   Social History  . Marital Status: Married    Spouse Name: N/A  . Number of Children: N/A  . Years of Education: N/A   Social History Main Topics  . Smoking status: Current Every Day Smoker  . Smokeless tobacco: None  . Alcohol Use: Yes  . Drug Use: No  . Sexual Activity: Not Asked   Other Topics Concern  . None   Social History Narrative     ROS: He has not coughed up any blood. He's had minimal chest pain. No cardiac arrhythmias. No leg swelling. He's not really coughing any sputum up. Otherwise per the history and physical    Objective: Vital signs in last 24 hours: Pulse Rate:  [68-100] 94 (01/19 0818) Resp:  [18-27] 18 (01/19 0818) BP: (112-155)/(78-98) 155/97 mmHg (01/19 0442) SpO2:  [90 %-99 %] 96 % (01/19 0818) Weight:  [64.774 kg (142 lb 12.8 oz)] 64.774 kg (142 lb 12.8 oz) (01/19 0442) Weight change:  Last BM Date: 01/02/16  Intake/Output from previous day: 01/18 0701 - 01/19 0700 In: 250 [IV Piggyback:250] Out: -   PHYSICAL EXAM He is awake  and alert. He looks fairly comfortable. His pupils react. His nose and throat are clear. His neck is supple without masses. His chest shows wheezing bilaterally. His heart is regular without gallop. Abdomen is soft without masses. Extremities show no edema. Central nervous system exam shows he does appear to be somewhat anxious otherwise intact  Lab Results: Basic Metabolic Panel:  Recent Labs  01/02/16 2240 01/03/16 0630  NA 136 138  K 4.7 4.6  CL 101 103  CO2 27 26  GLUCOSE 112* 169*  BUN 13 11  CREATININE 0.90 0.75  CALCIUM 9.2 9.3   Liver Function Tests:  Recent Labs  01/03/16 0630  AST 38  ALT 53  ALKPHOS 99  BILITOT 0.7  PROT 6.8  ALBUMIN 3.5   No results for input(s): LIPASE, AMYLASE in the last 72 hours. No results for input(s): AMMONIA in the last 72 hours. CBC:  Recent Labs  01/02/16 2240 01/03/16 0630  WBC 10.3 9.9  NEUTROABS 6.2 8.5*  HGB 13.4 13.9  HCT 40.3 41.5  MCV 96.9 96.3  PLT 268 289   Cardiac Enzymes:  Recent Labs  01/02/16 2240 01/03/16 0627  TROPONINI 0.04* 0.04*   BNP: No results for input(s): PROBNP in the last 72 hours. D-Dimer: No results for input(s): DDIMER in the  last 72 hours. CBG: No results for input(s): GLUCAP in the last 72 hours. Hemoglobin A1C: No results for input(s): HGBA1C in the last 72 hours. Fasting Lipid Panel: No results for input(s): CHOL, HDL, LDLCALC, TRIG, CHOLHDL, LDLDIRECT in the last 72 hours. Thyroid Function Tests: No results for input(s): TSH, T4TOTAL, FREET4, T3FREE, THYROIDAB in the last 72 hours. Anemia Panel: No results for input(s): VITAMINB12, FOLATE, FERRITIN, TIBC, IRON, RETICCTPCT in the last 72 hours. Coagulation: No results for input(s): LABPROT, INR in the last 72 hours. Urine Drug Screen: Drugs of Abuse  No results found for: LABOPIA, COCAINSCRNUR, LABBENZ, AMPHETMU, THCU, LABBARB  Alcohol Level: No results for input(s): ETH in the last 72 hours. Urinalysis: No results for  input(s): COLORURINE, LABSPEC, PHURINE, GLUCOSEU, HGBUR, BILIRUBINUR, KETONESUR, PROTEINUR, UROBILINOGEN, NITRITE, LEUKOCYTESUR in the last 72 hours.  Invalid input(s): APPERANCEUR Misc. Labs:   ABGS: No results for input(s): PHART, PO2ART, TCO2, HCO3 in the last 72 hours.  Invalid input(s): PCO2   MICROBIOLOGY: No results found for this or any previous visit (from the past 240 hour(s)).  Studies/Results: Dg Chest 2 View  01/02/2016  CLINICAL DATA:  60 year old male with intermittent severe shortness of breath and wheezing tonight. Recent history of bronchoscopy yesterday. EXAM: CHEST  2 VIEW COMPARISON:  No priors. FINDINGS: Mild diffuse peribronchial cuffing. Lung volumes are normal. No consolidative airspace disease. No pleural effusions. No pneumothorax. No pulmonary nodule or mass noted. Pulmonary vasculature and the cardiomediastinal silhouette are within normal limits. Atherosclerosis in the thoracic aorta. IMPRESSION: 1. Mild diffuse peribronchial cuffing, suggesting an acute bronchitis. 2. Atherosclerosis. Electronically Signed   By: Vinnie Langton M.D.   On: 01/02/2016 22:13   Ct Angio Chest Pe W/cm &/or Wo Cm  01/03/2016  CLINICAL DATA:  Sudden onset intermittent worsening shortness of breath beginning this afternoon. Status post bronchoscopy yesterday. History of COPD and stroke. EXAM: CT ANGIOGRAPHY CHEST WITH CONTRAST TECHNIQUE: Multidetector CT imaging of the chest was performed using the standard protocol during bolus administration of intravenous contrast. Multiplanar CT image reconstructions and MIPs were obtained to evaluate the vascular anatomy. CONTRAST:  137m OMNIPAQUE IOHEXOL 350 MG/ML SOLN COMPARISON:  Chest radiograph January 02, 2016 FINDINGS: PULMONARY ARTERY: Inadequate pulmonary arterial contrast opacification, 140 Hounsfield units, target is 250 Hounsfield units. Main pulmonary artery is not enlarged. No central pulmonary arterial filling defects. MEDIASTINUM:  Hypo enhancing 4.6 x 4.7 x 7.3 cm (AP by transverse by CC) sub carinal mass, moderately narrowing the bilateral mainstem bronchi which remains patent. Soft tissue effaces multiple lower lobe segmental bronchi, however patent subsegmental bronchi. Heart size is normal. Moderate coronary artery calcifications. Moderate calcific atherosclerosis of the aortic arch. LUNGS: Tracheobronchial tree is patent, no pneumothorax. Moderate centrilobular emphysema. Small faint ground-glass opacity RIGHT upper lobe. No pleural effusions, focal consolidations, parenchymal masses. SOFT TISSUES AND OSSEOUS STRUCTURES: Included view of the abdomen is unremarkable. Visualized soft tissues and included osseous structures appear normal. Review of the MIP images confirms the above findings. IMPRESSION: Suboptimal examination without central pulmonary embolism, on this delayed phase examination. 4.6 x 4.7 x 7.3 cm sub carinal/mediastinal mass moderately narrowing the bilateral mainstem bronchi which remains patent. Differential diagnosis includes infectious or inflammatory lymphadenopathy, lymphoma, metastasis. Mucoid impaction within multiple lower lobe segmental bronchi. Faint RIGHT upper lobe ground-glass opacity may be infectious or inflammatory. Moderate centrilobular emphysema. Electronically Signed   By: CElon AlasM.D.   On: 01/03/2016 02:04    Medications:  Prior to Admission:  Prescriptions prior to admission  Medication Sig Dispense Refill Last Dose  . acetaminophen (TYLENOL) 500 MG tablet Take 1,000 mg by mouth every 6 (six) hours as needed for mild pain, moderate pain or headache.   01/02/2016 at 1800  . albuterol (PROVENTIL HFA;VENTOLIN HFA) 108 (90 Base) MCG/ACT inhaler Inhale 2 puffs into the lungs every 4 (four) hours as needed for wheezing or shortness of breath.   01/01/2016 at Unknown time  . atorvastatin (LIPITOR) 80 MG tablet Take 80 mg by mouth every evening.   01/01/2016 at Unknown time  .  budesonide-formoterol (SYMBICORT) 160-4.5 MCG/ACT inhaler Inhale 2 puffs into the lungs 2 (two) times daily.   01/02/2016 at Unknown time  . cholecalciferol (VITAMIN D) 1000 units tablet Take 1,000 Units by mouth daily.   01/02/2016 at Unknown time  . clopidogrel (PLAVIX) 75 MG tablet Take 75 mg by mouth daily.   01/02/2016 at Unknown time  . cyanocobalamin 500 MCG tablet Take 500 mcg by mouth daily.   01/02/2016 at Unknown time  . folic acid (FOLVITE) 010 MCG tablet Take 400 mcg by mouth daily.   01/02/2016 at Unknown time  . propranolol (INDERAL) 20 MG tablet Take 20 mg by mouth 3 (three) times daily.   01/02/2016 at 800a  . pyridOXINE (VITAMIN B-6) 50 MG tablet Take 50 mg by mouth daily.   01/02/2016 at Unknown time  . sildenafil (VIAGRA) 25 MG tablet Take 25 mg by mouth daily as needed. For erectile dysfunction   unknown  . tiotropium (SPIRIVA) 18 MCG inhalation capsule Place 18 mcg into inhaler and inhale every evening.   01/01/2016 at Unknown time   Scheduled: . albuterol      . atorvastatin  80 mg Oral QPM  . budesonide (PULMICORT) nebulizer solution  0.25 mg Nebulization BID  . cholecalciferol  1,000 Units Oral Daily  . clopidogrel  75 mg Oral Daily  . enoxaparin (LOVENOX) injection  40 mg Subcutaneous Q24H  . folic acid  932 mcg Oral Daily  . guaiFENesin  600 mg Oral BID  . ipratropium-albuterol  3 mL Nebulization Q4H  . piperacillin-tazobactam (ZOSYN)  IV  3.375 g Intravenous Q8H  . propranolol  20 mg Oral TID  . pyridOXINE  50 mg Oral Daily  . vancomycin  750 mg Intravenous Q12H  . cyanocobalamin  500 mcg Oral Daily   Continuous: . sodium chloride 50 mL/hr at 01/03/16 0543   TFT:[id number] **OR** acetaminophen, albuterol, LORazepam, ondansetron **OR** ondansetron (ZOFRAN) IV  Assesment: He has acute hypoxic respiratory failure. He is being treated as healthcare associated pneumonia on the basis of his findings on CT. He has what looks like retained mucus in smaller airways. At  baseline he has COPD which is pretty severe. He had recent bronchoscopy for mediastinal lymphadenopathy which is presumably some sort of malignancy. Principal Problem:   Acute respiratory failure with hypoxia (HCC) Active Problems:   Elevated troponin   Chronic obstructive pulmonary disease with acute exacerbation (HCC)   Mediastinal lymphadenopathy   History of stroke   Acute respiratory failure ([place])    Plan: I agree with current treatment. He is on oral prednisone and I'll see how he does with that he may be switched to IV. I would add incentive spirometry and flutter valve. I gave him something for anxiety  Thanks for allowing me to see him with you    LOS: 0 days   Evva Din L 01/03/2016, 8:29 AM

## 2016-01-03 NOTE — H&P (Addendum)
[facility] History and Physical  Tyler Aguilar ZOX:[id number] DOB: [birth year] DOA: 01/02/2016  Referring physician: Dr. Dina Rich. PCP: Clinton Quant, MD  Specialists: Pulmonologist at [facility].  Chief Complaint: Shortness of breath.  HPI: Tyler Aguilar is a 60 y.o. male with history of COPD and CVA, abdominal artery aneurysm ongoing tobacco abuse who has had bronchoscopy for mediastinal lymphadenopathy 2 days ago started experiencing shortness of breath last afternoon. Patient initially called EMS and was provided oxygen following which patient's shortness of breath improved. Patient's shortness of breath recurred  later in the evening and patient's family provided some nebulizer following which patient's shortness of breath improved. Patient had contacted his pulmonologist and was instructed to come to the nearest ER. CT angiogram of the chest and was negative for PE but does show mucus plugging and possible airspace disease. Patient also was wheezing and was provided nebulizer treatment. Patient has been admitted for further management of his respiratory distress which could be from COPD. Patient's troponin was mildly elevated but patient denies any chest pain. EKG shows sinus rhythm with right bundle branch block. On-call cardiologist was consulted by ER physician. At this time to have recommended to cycle cardiac markers.   Review of Systems: As presented in the history of presenting illness, rest negative.  Past Medical History  Diagnosis Date  . Stroke [facility])     May 02, 2008  . COPD (chronic obstructive pulmonary disease) ([place])   . AAA (abdominal aortic aneurysm) [facility])    Past Surgical History  Procedure Laterality Date  . Finger surgery  1979    right side  . Colonoscopy  08/31/2012    Procedure: COLONOSCOPY;  Surgeon: Jamesetta So, MD;  Location: [facility];  Service: Gastroenterology;  Laterality: N/A;  . Bronchoscopy     Social History:  reports that he  has been smoking.  He does not have any smokeless tobacco history on file. He reports that he drinks alcohol. He reports that he does not use illicit drugs. Where does patient live home. Can patient participate in ADLs? Yes.   No Known Allergies  Family History:  Family History  Problem Relation Age of Onset  . Diabetes Mellitus II Mother   . Cancer Mother   . Cancer Father       Prior to Admission medications   Medication Sig Start Date End Date Taking? Authorizing Provider  acetaminophen (TYLENOL) 500 MG tablet Take 1,000 mg by mouth every 6 (six) hours as needed for mild pain, moderate pain or headache.   Yes Historical Provider, MD  albuterol (PROVENTIL HFA;VENTOLIN HFA) 108 (90 Base) MCG/ACT inhaler Inhale 2 puffs into the lungs every 4 (four) hours as needed for wheezing or shortness of breath.   Yes Historical Provider, MD  atorvastatin (LIPITOR) 80 MG tablet Take 80 mg by mouth every evening.   Yes Historical Provider, MD  budesonide-formoterol (SYMBICORT) 160-4.5 MCG/ACT inhaler Inhale 2 puffs into the lungs 2 (two) times daily.   Yes Historical Provider, MD  cholecalciferol (VITAMIN D) 1000 units tablet Take 1,000 Units by mouth daily.   Yes Historical Provider, MD  clopidogrel (PLAVIX) 75 MG tablet Take 75 mg by mouth daily.   Yes Historical Provider, MD  cyanocobalamin 500 MCG tablet Take 500 mcg by mouth daily.   Yes Historical Provider, MD  folic acid (FOLVITE) 811 MCG tablet Take 400 mcg by mouth daily.   Yes Historical Provider, MD  propranolol (INDERAL) 20 MG tablet Take 20 mg by  mouth 3 (three) times daily.   Yes Historical Provider, MD  pyridOXINE (VITAMIN B-6) 50 MG tablet Take 50 mg by mouth daily.   Yes Historical Provider, MD  sildenafil (VIAGRA) 25 MG tablet Take 25 mg by mouth daily as needed. For erectile dysfunction 12/03/15 01/02/16 Yes Historical Provider, MD  tiotropium (SPIRIVA) 18 MCG inhalation capsule Place 18 mcg into inhaler and inhale every evening.    Yes Historical Provider, MD    Physical Exam: Filed Vitals:   01/03/16 0245 01/03/16 0300 01/03/16 0434 01/03/16 0442  BP:  125/84  155/97  Pulse: 76 79  100  Resp: '22 22  22  '$ Height:    '5\' 5"'$  (1.651 m)  Weight:    64.774 kg (142 lb 12.8 oz)  SpO2: 90% 96% 99% 96%     General:  Moderately built and nourished.  Eyes: Anicteric no pallor.  ENT: No discharge from the ears eyes nose and mouth.  Neck: No mass felt.  Cardiovascular: S1-S2 heard.  Respiratory: Bilateral mild expiratory wheeze heard no crepitations.  Abdomen: Soft nontender bowel sounds present.  Skin: No rash.  Musculoskeletal: No edema.  Psychiatric: Appears normal.  Neurologic: Alert awake oriented to time place and person. Moves all extremities.  Labs on Admission:  Basic Metabolic Panel:  Recent Labs Lab 01/02/16 2240  NA 136  K 4.7  CL 101  CO2 27  GLUCOSE 112*  BUN 13  CREATININE 0.90  CALCIUM 9.2   Liver Function Tests: No results for input(s): AST, ALT, ALKPHOS, BILITOT, PROT, ALBUMIN in the last 168 hours. No results for input(s): LIPASE, AMYLASE in the last 168 hours. No results for input(s): AMMONIA in the last 168 hours. CBC:  Recent Labs Lab 01/02/16 2240  WBC 10.3  NEUTROABS 6.2  HGB 13.4  HCT 40.3  MCV 96.9  PLT 268   Cardiac Enzymes:  Recent Labs Lab 01/02/16 2240  TROPONINI 0.04*    BNP (last 3 results) No results for input(s): BNP in the last 8760 hours.  ProBNP (last 3 results) No results for input(s): PROBNP in the last 8760 hours.  CBG: No results for input(s): GLUCAP in the last 168 hours.  Radiological Exams on Admission: Dg Chest 2 View  01/02/2016  CLINICAL DATA:  60 year old male with intermittent severe shortness of breath and wheezing tonight. Recent history of bronchoscopy yesterday. EXAM: CHEST  2 VIEW COMPARISON:  No priors. FINDINGS: Mild diffuse peribronchial cuffing. Lung volumes are normal. No consolidative airspace disease. No pleural  effusions. No pneumothorax. No pulmonary nodule or mass noted. Pulmonary vasculature and the cardiomediastinal silhouette are within normal limits. Atherosclerosis in the thoracic aorta. IMPRESSION: 1. Mild diffuse peribronchial cuffing, suggesting an acute bronchitis. 2. Atherosclerosis. Electronically Signed   By: Vinnie Langton M.D.   On: 01/02/2016 22:13   Ct Angio Chest Pe W/cm &/or Wo Cm  01/03/2016  CLINICAL DATA:  Sudden onset intermittent worsening shortness of breath beginning this afternoon. Status post bronchoscopy yesterday. History of COPD and stroke. EXAM: CT ANGIOGRAPHY CHEST WITH CONTRAST TECHNIQUE: Multidetector CT imaging of the chest was performed using the standard protocol during bolus administration of intravenous contrast. Multiplanar CT image reconstructions and MIPs were obtained to evaluate the vascular anatomy. CONTRAST:  132m OMNIPAQUE IOHEXOL 350 MG/ML SOLN COMPARISON:  Chest radiograph January 02, 2016 FINDINGS: PULMONARY ARTERY: Inadequate pulmonary arterial contrast opacification, 140 Hounsfield units, target is 250 Hounsfield units. Main pulmonary artery is not enlarged. No central pulmonary arterial filling defects. MEDIASTINUM: Hypo enhancing 4.6  x 4.7 x 7.3 cm (AP by transverse by CC) sub carinal mass, moderately narrowing the bilateral mainstem bronchi which remains patent. Soft tissue effaces multiple lower lobe segmental bronchi, however patent subsegmental bronchi. Heart size is normal. Moderate coronary artery calcifications. Moderate calcific atherosclerosis of the aortic arch. LUNGS: Tracheobronchial tree is patent, no pneumothorax. Moderate centrilobular emphysema. Small faint ground-glass opacity RIGHT upper lobe. No pleural effusions, focal consolidations, parenchymal masses. SOFT TISSUES AND OSSEOUS STRUCTURES: Included view of the abdomen is unremarkable. Visualized soft tissues and included osseous structures appear normal. Review of the MIP images confirms the  above findings. IMPRESSION: Suboptimal examination without central pulmonary embolism, on this delayed phase examination. 4.6 x 4.7 x 7.3 cm sub carinal/mediastinal mass moderately narrowing the bilateral mainstem bronchi which remains patent. Differential diagnosis includes infectious or inflammatory lymphadenopathy, lymphoma, metastasis. Mucoid impaction within multiple lower lobe segmental bronchi. Faint RIGHT upper lobe ground-glass opacity may be infectious or inflammatory. Moderate centrilobular emphysema. Electronically Signed   By: Elon Alas M.D.   On: 01/03/2016 02:04    EKG: Independently reviewed. Normal sinus rhythm with RBBB.  Assessment/Plan Principal Problem:   Acute respiratory failure with hypoxia (HCC) Active Problems:   Elevated troponin   Chronic obstructive pulmonary disease with acute exacerbation (HCC)   Mediastinal lymphadenopathy   History of stroke   Acute respiratory failure ([place])   1. Acute respiratory failure and hypoxia possibly from COPD - CT scan does show some mucous plugging and possible airspace disease. At this time I have placed patient on empiric antibiotics for possible healthcare associated pneumonia with nebulizer Pulmicort. I have also requested pulmonary consult. 2. Elevated troponin - patient is chest pain-free. Patient is on Plavix and statins and propranolol. We will cycle cardiac markers check 2-D echo. Cardiology consult requested. 3. Mediastinal lymphadenopathy has had biopsy done at [facility] 2 days ago. 4. History of stroke on statins and Plavix. Has mild weakness in the left side from previous stroke. 5. Tremors on propranolol. 6. Tobacco abuse - tobacco cessation counseling requested. 7. History of abdominal aortic aneurysm - being followed at [facility]. Denies any abdominal pain.   DVT Prophylaxis Lovenox.  Code Status: Full code.  Family Communication: Discussed with family at the bedside.  Disposition Plan: Admit to  inpatient.    Khristie Sak N. [facility] Pager [phone].  If 7PM-7AM, please contact night-coverage [email or web address] Password [facility] 01/03/2016, 5:31 AM

## 2016-01-03 NOTE — Progress Notes (Addendum)
Patient talking on phone, became SOB, called to room, found patient sitting at bedside with labored respirations at 28/min, respiratory called for prn neb. Will continue to monitor.

## 2016-01-03 NOTE — ED Notes (Signed)
Report given to [facility] RN

## 2016-01-03 NOTE — Progress Notes (Signed)
Resting calmly in bed with no respiratory distress noted after breathing treatment administered, visitors in room.  Will continue to monitor.

## 2016-01-03 NOTE — Progress Notes (Signed)
ANTIBIOTIC CONSULT NOTE  Pharmacy Consult for vancomycin, zosyn Indication: pneumonia  No Known Allergies  Patient Measurements: Height: '5\' 5"'$  (165.1 cm) Weight: 142 lb 12.8 oz (64.774 kg) IBW/kg (Calculated) : 61.5  Vital Signs: Temp: 98 F (36.7 C) (01/19 0800) Temp Source: Oral (01/19 0800) BP: 124/88 mmHg (01/19 0800) Pulse Rate: 94 (01/19 0818)  Labs:  Recent Labs  01/02/16 2240 01/03/16 0630  WBC 10.3 9.9  HGB 13.4 13.9  PLT 268 289  CREATININE 0.90 0.75   Estimated Creatinine Clearance: 85.4 mL/min (by C-G formula based on Cr of 0.75).  No results for input(s): VANCOTROUGH, VANCOPEAK, VANCORANDOM, GENTTROUGH, GENTPEAK, GENTRANDOM, TOBRATROUGH, TOBRAPEAK, TOBRARND, AMIKACINPEAK, AMIKACINTROU, AMIKACIN in the last 72 hours.   Microbiology: No results found for this or any previous visit (from the past 720 hour(s)).  Medical History: Past Medical History  Diagnosis Date  . Stroke [facility])     May 02, 2008  . COPD (chronic obstructive pulmonary disease) ([place])   . AAA (abdominal aortic aneurysm) ([place])   . Dyslipidemia   . Mediastinal mass    Medications:  Scheduled:  . albuterol      . atorvastatin  80 mg Oral QPM  . budesonide (PULMICORT) nebulizer solution  0.25 mg Nebulization BID  . cholecalciferol  1,000 Units Oral Daily  . clopidogrel  75 mg Oral Daily  . enoxaparin (LOVENOX) injection  40 mg Subcutaneous Q24H  . folic acid  155 mcg Oral Daily  . guaiFENesin  600 mg Oral BID  . ipratropium-albuterol  3 mL Nebulization Q4H  . piperacillin-tazobactam (ZOSYN)  IV  3.375 g Intravenous Q8H  . propranolol  20 mg Oral TID  . pyridOXINE  50 mg Oral Daily  . vancomycin  750 mg Intravenous Q12H  . cyanocobalamin  500 mcg Oral Daily   Anti-infectives    Start     Dose/Rate Route Frequency Ordered Stop   01/03/16 1800  vancomycin (VANCOCIN) IVPB 750 mg/150 ml premix     750 mg 150 mL/hr over 60 Minutes Intravenous Every 12 hours 01/03/16 0735     01/03/16  0600  vancomycin (VANCOCIN) IVPB 1000 mg/200 mL premix     1,000 mg 200 mL/hr over 60 Minutes Intravenous  Once 01/03/16 0541 01/03/16 0653   01/03/16 0600  piperacillin-tazobactam (ZOSYN) IVPB 3.375 g     3.375 g 12.5 mL/hr over 240 Minutes Intravenous Every 8 hours 01/03/16 0541       Assessment: 60 yo male with COPD being treated for possible HCAP.  Estimated Creatinine Clearance: 85.4 mL/min (by C-G formula based on Cr of 0.75).  Goal of Therapy:  Vancomycin trough level 15-20 mcg/ml  Plan:  Zosyn 3.375gm IV q8h, EID Vancomycin '750mg'$  IV q12hrs Check trough at steady state Monitor labs, renal fxn, progress and c/s  Hart Robinsons A, RPH 01/03/2016,11:18 AM

## 2016-01-03 NOTE — Progress Notes (Signed)
[facility] PROGRESS NOTE  Tyler Aguilar FUX:[id number] DOB: [birth year]    PCP:   Clinton Quant, MD   HPI: Tyler Aguilar is an 60 y.o. male with hx of recent mediastinal mass, with long standing hx of tobacco use, AAA, COPD, prior CVA, recently had biopsy at [facility] with result pending, admitted for SOB with intermittent stridor, seems to be worse with anxiety, Tx with oral prednisone, antibiotics, and nebs.  CTPA showed no PE, but large mass is seen compressing moderately on main stem bronchi, but they remain patent.  I noted that he has intermittent stridor and SOB, but when he is more relax, he breath quite normally.   Rewiew of Systems:  Constitutional: Negative for malaise, fever and chills. No significant weight loss or weight gain Eyes: Negative for eye pain, redness and discharge, diplopia, visual changes, or flashes of light. ENMT: Negative for ear pain, hoarseness, nasal congestion, sinus pressure and sore throat. No headaches; tinnitus, drooling, or problem swallowing. Cardiovascular: Negative for chest pain, palpitations, diaphoresis, dyspnea and peripheral edema. ; No orthopnea, PND Respiratory: Negative for cough, hemoptysis, wheezing and stridor. No pleuritic chestpain. Gastrointestinal: Negative for nausea, vomiting, diarrhea, constipation, abdominal pain, melena, blood in stool, hematemesis, jaundice and rectal bleeding.    Genitourinary: Negative for frequency, dysuria, incontinence,flank pain and hematuria; Musculoskeletal: Negative for back pain and neck pain. Negative for swelling and trauma.;  Skin: . Negative for pruritus, rash, abrasions, bruising and skin lesion.; ulcerations Neuro: Negative for headache, lightheadedness and neck stiffness. Negative for weakness, altered level of consciousness , altered mental status, extremity weakness, burning feet, involuntary movement, seizure and syncope.  Psych: negative for anxiety, depression, insomnia, tearfulness,  panic attacks, hallucinations, paranoia, suicidal or homicidal ideation    Past Medical History  Diagnosis Date  . Stroke [facility])     May 02, 2008  . COPD (chronic obstructive pulmonary disease) ([place])   . AAA (abdominal aortic aneurysm) ([place])   . Dyslipidemia   . Mediastinal mass     Past Surgical History  Procedure Laterality Date  . Finger surgery  1979    right side  . Colonoscopy  08/31/2012    Procedure: COLONOSCOPY;  Surgeon: Jamesetta So, MD;  [facility];  Service: Gastroenterology;  Laterality: N/A;  . Bronchoscopy      Medications:  HOME MEDS: Prior to Admission medications   Medication Sig Start Date End Date Taking? Authorizing Provider  acetaminophen (TYLENOL) 500 MG tablet Take 1,000 mg by mouth every 6 (six) hours as needed for mild pain, moderate pain or headache.   Yes Historical Provider, MD  albuterol (PROVENTIL HFA;VENTOLIN HFA) 108 (90 Base) MCG/ACT inhaler Inhale 2 puffs into the lungs every 4 (four) hours as needed for wheezing or shortness of breath.   Yes Historical Provider, MD  atorvastatin (LIPITOR) 80 MG tablet Take 80 mg by mouth every evening.   Yes Historical Provider, MD  budesonide-formoterol (SYMBICORT) 160-4.5 MCG/ACT inhaler Inhale 2 puffs into the lungs 2 (two) times daily.   Yes Historical Provider, MD  cholecalciferol (VITAMIN D) 1000 units tablet Take 1,000 Units by mouth daily.   Yes Historical Provider, MD  clopidogrel (PLAVIX) 75 MG tablet Take 75 mg by mouth daily.   Yes Historical Provider, MD  cyanocobalamin 500 MCG tablet Take 500 mcg by mouth daily.   Yes Historical Provider, MD  folic acid (FOLVITE) 025 MCG tablet Take 400 mcg by mouth daily.   Yes Historical Provider, MD  propranolol (INDERAL) 20  MG tablet Take 20 mg by mouth 3 (three) times daily.   Yes Historical Provider, MD  pyridOXINE (VITAMIN B-6) 50 MG tablet Take 50 mg by mouth daily.   Yes Historical Provider, MD  sildenafil (VIAGRA) 25 MG tablet Take 25 mg by  mouth daily as needed. For erectile dysfunction 12/03/15 01/02/16 Yes Historical Provider, MD  tiotropium (SPIRIVA) 18 MCG inhalation capsule Place 18 mcg into inhaler and inhale every evening.   Yes Historical Provider, MD     Allergies:  No Known Allergies  Social History:   reports that he has been smoking.  He does not have any smokeless tobacco history on file. He reports that he drinks alcohol. He reports that he does not use illicit drugs.  Family History: Family History  Problem Relation Age of Onset  . Diabetes Mellitus II Mother   . Cancer Mother   . Cancer Father   . Coronary artery disease Brother     CABG     Physical Exam: Filed Vitals:   01/03/16 0800 01/03/16 0818 01/03/16 1017 01/03/16 1254  BP: 124/88     Pulse: 85 94    Temp: 98 F (36.7 C)     TempSrc: Oral     Resp: 20 18    Height:      Weight:      SpO2: 99% 96% 96% 96%   Blood pressure 124/88, pulse 94, temperature 98 F (36.7 C), temperature source Oral, resp. rate 18, height '5\' 5"'$  (1.651 m), weight 64.774 kg (142 lb 12.8 oz), SpO2 96 %.  GEN:  Pleasant  patient lying in the stretcher in no acute distress; cooperative with exam. PSYCH:  alert and oriented x4; does not appear anxious or depressed; affect is appropriate. HEENT: Mucous membranes pink and anicteric; PERRLA; EOM intact; no cervical lymphadenopathy nor thyromegaly or carotid bruit; no JVD; There were no stridor. Neck is very supple. Breasts:: Not examined CHEST WALL: No tenderness CHEST: Normal respiration, clear with mild wheezing, but intermittently, he has stridor.  HEART: Regular rate and rhythm.  There are no murmur, rub, or gallops.   BACK: No kyphosis or scoliosis; no CVA tenderness ABDOMEN: soft and non-tender; no masses, no organomegaly, normal abdominal bowel sounds; no pannus; no intertriginous candida. There is no rebound and no distention. Rectal Exam: Not done EXTREMITIES: No bone or joint deformity; age-appropriate  arthropathy of the hands and knees; no edema; no ulcerations.  There is no calf tenderness. Genitalia: not examined PULSES: 2+ and symmetric SKIN: Normal hydration no rash or ulceration CNS: Cranial nerves 2-12 grossly intact no focal lateralizing neurologic deficit.  Speech is fluent; uvula elevated with phonation, facial symmetry and tongue midline. DTR are normal bilaterally, cerebella exam is intact, barbinski is negative and strengths are equaled bilaterally.  No sensory loss.   Labs on Admission:  Basic Metabolic Panel:  Recent Labs Lab 01/02/16 2240 01/03/16 0630  NA 136 138  K 4.7 4.6  CL 101 103  CO2 27 26  GLUCOSE 112* 169*  BUN 13 11  CREATININE 0.90 0.75  CALCIUM 9.2 9.3   Liver Function Tests:  Recent Labs Lab 01/03/16 0630  AST 38  ALT 53  ALKPHOS 99  BILITOT 0.7  PROT 6.8  ALBUMIN 3.5   CBC:  Recent Labs Lab 01/02/16 2240 01/03/16 0630  WBC 10.3 9.9  NEUTROABS 6.2 8.5*  HGB 13.4 13.9  HCT 40.3 41.5  MCV 96.9 96.3  PLT 268 289   Cardiac Enzymes:  Recent  Labs Lab 01/02/16 2240 01/03/16 0627 01/03/16 1035  TROPONINI 0.04* 0.04* 0.03    CBG:  Recent Labs Lab 01/03/16 0923  GLUCAP 176*     Radiological Exams on Admission: Dg Chest 2 View  01/02/2016  CLINICAL DATA:  60 year old male with intermittent severe shortness of breath and wheezing tonight. Recent history of bronchoscopy yesterday. EXAM: CHEST  2 VIEW COMPARISON:  No priors. FINDINGS: Mild diffuse peribronchial cuffing. Lung volumes are normal. No consolidative airspace disease. No pleural effusions. No pneumothorax. No pulmonary nodule or mass noted. Pulmonary vasculature and the cardiomediastinal silhouette are within normal limits. Atherosclerosis in the thoracic aorta. IMPRESSION: 1. Mild diffuse peribronchial cuffing, suggesting an acute bronchitis. 2. Atherosclerosis. Electronically Signed   By: Vinnie Langton M.D.   On: 01/02/2016 22:13   Ct Angio Chest Pe W/cm &/or Wo  Cm  01/03/2016  CLINICAL DATA:  Sudden onset intermittent worsening shortness of breath beginning this afternoon. Status post bronchoscopy yesterday. History of COPD and stroke. EXAM: CT ANGIOGRAPHY CHEST WITH CONTRAST TECHNIQUE: Multidetector CT imaging of the chest was performed using the standard protocol during bolus administration of intravenous contrast. Multiplanar CT image reconstructions and MIPs were obtained to evaluate the vascular anatomy. CONTRAST:  137m OMNIPAQUE IOHEXOL 350 MG/ML SOLN COMPARISON:  Chest radiograph January 02, 2016 FINDINGS: PULMONARY ARTERY: Inadequate pulmonary arterial contrast opacification, 140 Hounsfield units, target is 250 Hounsfield units. Main pulmonary artery is not enlarged. No central pulmonary arterial filling defects. MEDIASTINUM: Hypo enhancing 4.6 x 4.7 x 7.3 cm (AP by transverse by CC) sub carinal mass, moderately narrowing the bilateral mainstem bronchi which remains patent. Soft tissue effaces multiple lower lobe segmental bronchi, however patent subsegmental bronchi. Heart size is normal. Moderate coronary artery calcifications. Moderate calcific atherosclerosis of the aortic arch. LUNGS: Tracheobronchial tree is patent, no pneumothorax. Moderate centrilobular emphysema. Small faint ground-glass opacity RIGHT upper lobe. No pleural effusions, focal consolidations, parenchymal masses. SOFT TISSUES AND OSSEOUS STRUCTURES: Included view of the abdomen is unremarkable. Visualized soft tissues and included osseous structures appear normal. Review of the MIP images confirms the above findings. IMPRESSION: Suboptimal examination without central pulmonary embolism, on this delayed phase examination. 4.6 x 4.7 x 7.3 cm sub carinal/mediastinal mass moderately narrowing the bilateral mainstem bronchi which remains patent. Differential diagnosis includes infectious or inflammatory lymphadenopathy, lymphoma, metastasis. Mucoid impaction within multiple lower lobe segmental  bronchi. Faint RIGHT upper lobe ground-glass opacity may be infectious or inflammatory. Moderate centrilobular emphysema. Electronically Signed   By: CElon AlasM.D.   On: 01/03/2016 02:04    EKG: Independently reviewed.    Assessment/Plan Present on Admission:  . Acute respiratory failure with hypoxia ([place] . Elevated troponin . Chronic obstructive pulmonary disease with acute exacerbation ([place] . Mediastinal mass . PVD (peripheral vascular disease) ([place] . Smoker . Dyslipidemia . RBBB  PLAN:  Mediastinal mass:  [facility].  I am concerned about malignancy.  His COPD is being treated, but it seems when he is nervous, he gets strider as well.  Will give racemic epinephrine nebs.  Will suppress his coughs with codeine syrup, and change oral to IV Steroids.  Hold incentive spirometry, but continue with flutter valve. I will give ativan around the clock.  He is a full code.    Other plans as per orders. Code Status: FULL CHaskel Khan MD.  FACP [facility] Pager [phone] to 7am.  01/03/2016, 2:54 PM

## 2016-01-03 NOTE — Consult Note (Signed)
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 He denies any history of CAD or prior cardiac evaluation. He is 1 PPD smoker. He denies any history of HTN, or DM. He is on Lipitor. He developed hemoptysis and evaluation at [facility] revealed a large subcarinal mass. He had bronchoscopy with biopsy this past Tuesday and says he did well until last PM when be came SOB. He was admitted through the ED at [facility] with acute respiratory failure secondary to COPD exacerbation. He denies any chest pain. His Troponin was 0.04-0.04. His EKG shows RBBB with no acute changes (only old EKG I could find in Care Everywhere was from 2002- no RBBB mentioned). Marland Kitchen   PMHx:  Past Medical History  Diagnosis Date  . Stroke [facility])     May 02, 2008  . COPD (chronic obstructive pulmonary disease) ([place])   . AAA (abdominal aortic aneurysm) ([place])   . Dyslipidemia   . Mediastinal mass     Past Surgical History  Procedure Laterality Date  . Finger surgery  1979    right side  . Colonoscopy  08/31/2012    Procedure: COLONOSCOPY;  Surgeon: Jamesetta So, MD;  Location[facility];  Service: Gastroenterology;  Laterality: N/A;  . Bronchoscopy      SOCHx:  reports that he has been smoking.  He does not have any smokeless tobacco history on file. He reports that he drinks alcohol. He reports that he does not use illicit drugs.  FAMHx: Family History  Problem Relation Age of Onset  . Diabetes Mellitus II Mother   . Cancer Mother   . Cancer Father   . Coronary artery disease Brother     CABG    ALLERGIES: No Known Allergies  ROS: Review of Systems: General: negative for chills, fever, night sweats or weight changes.  Cardiovascular: negative for chest pain, edema, orthopnea, palpitations, paroxysmal nocturnal dyspnea  HEENT: negative for any visual  disturbances, blindness, glaucoma Dermatological: negative for rash Respiratory: positive for hemoptysis, wheezing, SOB Urologic: negative for hematuria or dysuria Abdominal: negative for nausea, vomiting, diarrhea, bright red blood per rectum, melena, or hematemesis Neurologic: negative for visual changes, syncope, or dizziness Musculoskeletal: negative for back pain, joint pain, or swelling Psych: cooperative and appropriate All other systems reviewed and are otherwise negative except as noted above.   HOME MEDICATIONS: Prior to Admission medications   Medication Sig Start Date End Date Taking? Authorizing Provider  acetaminophen (TYLENOL) 500 MG tablet Take 1,000 mg by mouth every 6 (six) hours as needed for mild pain, moderate pain or headache.   Yes Historical Provider, MD  albuterol (PROVENTIL HFA;VENTOLIN HFA) 108 (90 Base) MCG/ACT inhaler Inhale 2 puffs into the lungs every 4 (four) hours as needed for wheezing or shortness of breath.   Yes Historical Provider, MD  atorvastatin (LIPITOR) 80 MG tablet Take 80 mg by mouth every evening.   Yes Historical Provider, MD  budesonide-formoterol (SYMBICORT) 160-4.5 MCG/ACT inhaler Inhale 2 puffs into the lungs 2 (two) times daily.   Yes Historical Provider, MD  cholecalciferol (VITAMIN D) 1000 units tablet Take 1,000 Units by mouth daily.   Yes Historical Provider, MD  clopidogrel (PLAVIX) 75 MG tablet Take 75 mg by mouth daily.   Yes Historical Provider, MD  cyanocobalamin 500 MCG tablet Take 500  mcg by mouth daily.   Yes Historical Provider, MD  folic acid (FOLVITE) 630 MCG tablet Take 400 mcg by mouth daily.   Yes Historical Provider, MD  propranolol (INDERAL) 20 MG tablet Take 20 mg by mouth 3 (three) times daily.   Yes Historical Provider, MD  pyridOXINE (VITAMIN B-6) 50 MG tablet Take 50 mg by mouth daily.   Yes Historical Provider, MD  sildenafil (VIAGRA) 25 MG tablet Take 25 mg by mouth daily as needed. For erectile dysfunction  12/03/15 01/02/16 Yes Historical Provider, MD  tiotropium (SPIRIVA) 18 MCG inhalation capsule Place 18 mcg into inhaler and inhale every evening.   Yes Historical Provider, MD    HOSPITAL MEDICATIONS: I have reviewed the patient's current medications.  VITALS: Blood pressure 124/88, pulse 94, temperature 98 F (36.7 C), temperature source Oral, resp. rate 18, height '5\' 5"'$  (1.651 m), weight 142 lb 12.8 oz (64.774 kg), SpO2 96 %.  PHYSICAL EXAM: General appearance: alert, cooperative, appears older than stated age and no distress Neck: no carotid bruit and no JVD Lungs: wheezing and rhonchi Heart: regular rate and rhythm and decreased heart sounds Abdomen: soft, non-tender; bowel sounds normal; no masses,  no organomegaly Extremities: no edema, Lt FA bruit Pulses: diminnished distal pulses Skin: cool dry Neurologic: Grossly normal  LABS: Results for orders placed or performed during the hospital encounter of 01/02/16 (from the past 24 hour(s))  CBC with Differential     Status: Abnormal   Collection Time: 01/02/16 10:40 PM  Result Value Ref Range   WBC 10.3 4.0 - 10.5 K/uL   RBC 4.16 (L) 4.22 - 5.81 MIL/uL   Hemoglobin 13.4 13.0 - 17.0 g/dL   HCT 40.3 39.0 - 52.0 %   MCV 96.9 78.0 - 100.0 fL   MCH 32.2 26.0 - 34.0 pg   MCHC 33.3 30.0 - 36.0 g/dL   RDW 13.0 11.5 - 15.5 %   Platelets 268 150 - 400 K/uL   Neutrophils Relative % 59 %   Neutro Abs 6.2 1.7 - 7.7 K/uL   Lymphocytes Relative 28 %   Lymphs Abs 2.8 0.7 - 4.0 K/uL   Monocytes Relative 10 %   Monocytes Absolute 1.0 0.1 - 1.0 K/uL   Eosinophils Relative 3 %   Eosinophils Absolute 0.3 0.0 - 0.7 K/uL   Basophils Relative 0 %   Basophils Absolute 0.0 0.0 - 0.1 K/uL  Basic metabolic panel     Status: Abnormal   Collection Time: 01/02/16 10:40 PM  Result Value Ref Range   Sodium 136 135 - 145 mmol/L   Potassium 4.7 3.5 - 5.1 mmol/L   Chloride 101 101 - 111 mmol/L   CO2 27 22 - 32 mmol/L   Glucose, Bld 112 (H) 65 - 99  mg/dL   BUN 13 6 - 20 mg/dL   Creatinine, Ser 0.90 0.61 - 1.24 mg/dL   Calcium 9.2 8.9 - 10.3 mg/dL   GFR calc non Af Amer >60 >60 mL/min   GFR calc Af Amer >60 >60 mL/min   Anion gap 8 5 - 15  Troponin I     Status: Abnormal   Collection Time: 01/02/16 10:40 PM  Result Value Ref Range   Troponin I 0.04 (H) <0.031 ng/mL  Troponin I (q 6hr x 3)     Status: Abnormal   Collection Time: 01/03/16  6:27 AM  Result Value Ref Range   Troponin I 0.04 (H) <0.031 ng/mL  Comprehensive metabolic panel  Status: Abnormal   Collection Time: 01/03/16  6:30 AM  Result Value Ref Range   Sodium 138 135 - 145 mmol/L   Potassium 4.6 3.5 - 5.1 mmol/L   Chloride 103 101 - 111 mmol/L   CO2 26 22 - 32 mmol/L   Glucose, Bld 169 (H) 65 - 99 mg/dL   BUN 11 6 - 20 mg/dL   Creatinine, Ser 0.75 0.61 - 1.24 mg/dL   Calcium 9.3 8.9 - 10.3 mg/dL   Total Protein 6.8 6.5 - 8.1 g/dL   Albumin 3.5 3.5 - 5.0 g/dL   AST 38 15 - 41 U/L   ALT 53 17 - 63 U/L   Alkaline Phosphatase 99 38 - 126 U/L   Total Bilirubin 0.7 0.3 - 1.2 mg/dL   GFR calc non Af Amer >60 >60 mL/min   GFR calc Af Amer >60 >60 mL/min   Anion gap 9 5 - 15  CBC WITH DIFFERENTIAL     Status: Abnormal   Collection Time: 01/03/16  6:30 AM  Result Value Ref Range   WBC 9.9 4.0 - 10.5 K/uL   RBC 4.31 4.22 - 5.81 MIL/uL   Hemoglobin 13.9 13.0 - 17.0 g/dL   HCT 41.5 39.0 - 52.0 %   MCV 96.3 78.0 - 100.0 fL   MCH 32.3 26.0 - 34.0 pg   MCHC 33.5 30.0 - 36.0 g/dL   RDW 13.0 11.5 - 15.5 %   Platelets 289 150 - 400 K/uL   Neutrophils Relative % 85 %   Neutro Abs 8.5 (H) 1.7 - 7.7 K/uL   Lymphocytes Relative 12 %   Lymphs Abs 1.2 0.7 - 4.0 K/uL   Monocytes Relative 3 %   Monocytes Absolute 0.3 0.1 - 1.0 K/uL   Eosinophils Relative 0 %   Eosinophils Absolute 0.0 0.0 - 0.7 K/uL   Basophils Relative 0 %   Basophils Absolute 0.0 0.0 - 0.1 K/uL    EKG: NSR RBBB  IMAGING: Dg Chest 2 View  01/02/2016  CLINICAL DATA:  60 year old male with  intermittent severe shortness of breath and wheezing tonight. Recent history of bronchoscopy yesterday. EXAM: CHEST  2 VIEW COMPARISON:  No priors. FINDINGS: Mild diffuse peribronchial cuffing. Lung volumes are normal. No consolidative airspace disease. No pleural effusions. No pneumothorax. No pulmonary nodule or mass noted. Pulmonary vasculature and the cardiomediastinal silhouette are within normal limits. Atherosclerosis in the thoracic aorta. IMPRESSION: 1. Mild diffuse peribronchial cuffing, suggesting an acute bronchitis. 2. Atherosclerosis. Electronically Signed   By: Vinnie Langton M.D.   On: 01/02/2016 22:13   Ct Angio Chest Pe W/cm &/or Wo Cm  01/03/2016  CLINICAL DATA:  Sudden onset intermittent worsening shortness of breath beginning this afternoon. Status post bronchoscopy yesterday. History of COPD and stroke. EXAM: CT ANGIOGRAPHY CHEST WITH CONTRAST TECHNIQUE: Multidetector CT imaging of the chest was performed using the standard protocol during bolus administration of intravenous contrast. Multiplanar CT image reconstructions and MIPs were obtained to evaluate the vascular anatomy. CONTRAST:  157m OMNIPAQUE IOHEXOL 350 MG/ML SOLN COMPARISON:  Chest radiograph January 02, 2016 FINDINGS: PULMONARY ARTERY: Inadequate pulmonary arterial contrast opacification, 140 Hounsfield units, target is 250 Hounsfield units. Main pulmonary artery is not enlarged. No central pulmonary arterial filling defects. MEDIASTINUM: Hypo enhancing 4.6 x 4.7 x 7.3 cm (AP by transverse by CC) sub carinal mass, moderately narrowing the bilateral mainstem bronchi which remains patent. Soft tissue effaces multiple lower lobe segmental bronchi, however patent subsegmental bronchi. Heart size is normal. Moderate  coronary artery calcifications. Moderate calcific atherosclerosis of the aortic arch. LUNGS: Tracheobronchial tree is patent, no pneumothorax. Moderate centrilobular emphysema. Small faint ground-glass opacity RIGHT  upper lobe. No pleural effusions, focal consolidations, parenchymal masses. SOFT TISSUES AND OSSEOUS STRUCTURES: Included view of the abdomen is unremarkable. Visualized soft tissues and included osseous structures appear normal. Review of the MIP images confirms the above findings. IMPRESSION: Suboptimal examination without central pulmonary embolism, on this delayed phase examination. 4.6 x 4.7 x 7.3 cm sub carinal/mediastinal mass moderately narrowing the bilateral mainstem bronchi which remains patent. Differential diagnosis includes infectious or inflammatory lymphadenopathy, lymphoma, metastasis. Mucoid impaction within multiple lower lobe segmental bronchi. Faint RIGHT upper lobe ground-glass opacity may be infectious or inflammatory. Moderate centrilobular emphysema. Electronically Signed   By: Elon Alas M.D.   On: 01/03/2016 02:04    IMPRESSION: Principal Problem:   Acute respiratory failure with hypoxia (HCC) Active Problems:   Chronic obstructive pulmonary disease with acute exacerbation (HCC)   Elevated troponin   Mediastinal mass   History of stroke   Smoker   Dyslipidemia   RBBB   PVD (peripheral vascular disease) (HCC)   RECOMMENDATION: MD to see. Consider echo when stable  Time Spent Directly with Patient: 31 minutes  Kerin Ransom, [place] beeper 01/03/2016, 9:02 AM   Attending Note  Patient seen and discussed with PA Rosalyn Gess, I agree with is documentation above. 60 yo male with history of COPD, CVA, AAA, and mediastinal mass with history of hemoptysis followed at [facility] admitted with SOB, he is being managed for COPD exacerbation.   Hgb 13.9, WBC 9.9, Plt 289, K 4.6, Cr 0.75 Trop 0.04 and flat EKG SR, RBBB.  Mild nonspecific flat troponin elevation in setting of COPD exacerbation, EKG without acute ischemic changes, no symptoms of chest pain. At this time do not suspect acute cardiac pathology, we will f/u echo results but don't anticipate further  testing unless significant finding on echo. Can continue his home medication regimen.  Zandra Abts MD

## 2016-01-03 NOTE — Progress Notes (Signed)
*  PRELIMINARY RESULTS* Echocardiogram 2D Echocardiogram has been performed.  Leavy Cella 01/03/2016, 3:24 PM

## 2016-01-03 NOTE — Progress Notes (Signed)
ANTIBIOTIC CONSULT NOTE-Preliminary  Pharmacy Consult for vancomycin, zosyn Indication: pneumonia  No Known Allergies  Patient Measurements: Height: '5\' 5"'$  (165.1 cm) Weight: 142 lb 12.8 oz (64.774 kg) IBW/kg (Calculated) : 61.5   Vital Signs: BP: 155/97 mmHg (01/19 0442) Pulse Rate: 100 (01/19 0442)  Labs:  Recent Labs  01/02/16 2240  WBC 10.3  HGB 13.4  PLT 268  CREATININE 0.90    Estimated Creatinine Clearance: 75.9 mL/min (by C-G formula based on Cr of 0.9).  No results for input(s): VANCOTROUGH, VANCOPEAK, VANCORANDOM, GENTTROUGH, GENTPEAK, GENTRANDOM, TOBRATROUGH, TOBRAPEAK, TOBRARND, AMIKACINPEAK, AMIKACINTROU, AMIKACIN in the last 72 hours.   Microbiology: No results found for this or any previous visit (from the past 720 hour(s)).  Medical History: Past Medical History  Diagnosis Date  . Stroke [facility])     May 02, 2008  . COPD (chronic obstructive pulmonary disease) ([place])   . AAA (abdominal aortic aneurysm) (HCC)     Medications:  Scheduled:  . albuterol  2.5 mg Nebulization Q4H  . albuterol      . atorvastatin  80 mg Oral QPM  . budesonide (PULMICORT) nebulizer solution  0.25 mg Nebulization BID  . cholecalciferol  1,000 Units Oral Daily  . clopidogrel  75 mg Oral Daily  . enoxaparin (LOVENOX) injection  40 mg Subcutaneous Q24H  . folic acid  144 mcg Oral Daily  . guaiFENesin  600 mg Oral BID  . ipratropium  0.5 mg Nebulization Q4H  . piperacillin-tazobactam (ZOSYN)  IV  3.375 g Intravenous Q8H  . propranolol  20 mg Oral TID  . pyridOXINE  50 mg Oral Daily  . vancomycin  1,000 mg Intravenous Once  . cyanocobalamin  500 mcg Oral Daily    Assessment: 60 yo male with COPD being treated for possible HCAP  Goal of Therapy:  Vancomycin trough level 15-20 mcg/ml  Plan:  Preliminary review of pertinent patient information completed.  Protocol will be initiated with a one-time dose(s) of vancomycin 1000 mg and zosyn 3.375 grams Q 8 hours.  Forestine Na clinical pharmacist will complete review during morning rounds to assess patient and finalize treatment regimen.  Derrell Milanes, Magdalene Molly, RPH 01/03/2016,5:43 AM

## 2016-01-04 DIAGNOSIS — Z8673 Personal history of transient ischemic attack (TIA), and cerebral infarction without residual deficits: Secondary | ICD-10-CM

## 2016-01-04 DIAGNOSIS — E785 Hyperlipidemia, unspecified: Secondary | ICD-10-CM

## 2016-01-04 DIAGNOSIS — J9601 Acute respiratory failure with hypoxia: Secondary | ICD-10-CM

## 2016-01-04 DIAGNOSIS — J441 Chronic obstructive pulmonary disease with (acute) exacerbation: Principal | ICD-10-CM

## 2016-01-04 LAB — CBC
HEMATOCRIT: 41.4 % (ref 39.0–52.0)
Hemoglobin: 13.7 g/dL (ref 13.0–17.0)
MCH: 32.4 pg (ref 26.0–34.0)
MCHC: 33.1 g/dL (ref 30.0–36.0)
MCV: 97.9 fL (ref 78.0–100.0)
PLATELETS: 301 10*3/uL (ref 150–400)
RBC: 4.23 MIL/uL (ref 4.22–5.81)
RDW: 13.2 % (ref 11.5–15.5)
WBC: 9.5 10*3/uL (ref 4.0–10.5)

## 2016-01-04 LAB — BASIC METABOLIC PANEL
Anion gap: 6 (ref 5–15)
BUN: 13 mg/dL (ref 6–20)
CHLORIDE: 103 mmol/L (ref 101–111)
CO2: 29 mmol/L (ref 22–32)
CREATININE: 0.79 mg/dL (ref 0.61–1.24)
Calcium: 9.3 mg/dL (ref 8.9–10.3)
GFR calc Af Amer: >60 mL/min (ref >60)
GFR calc non Af Amer: >60 mL/min (ref >60)
Glucose, Bld: 158 mg/dL — ABNORMAL HIGH (ref 65–99)
POTASSIUM: 4.8 mmol/L (ref 3.5–5.1)
SODIUM: 138 mmol/L (ref 135–145)

## 2016-01-04 MED ORDER — HYDROCODONE-HOMATROPINE 5-1.5 MG/5ML PO SYRP: 5.0000 mL | ORAL_SOLUTION | Freq: Four times a day (QID) | ORAL | Status: DC

## 2016-01-04 MED ORDER — LEVOFLOXACIN 500 MG PO TABS: 500.0000 mg | ORAL_TABLET | Freq: Every day | ORAL | Status: DC

## 2016-01-04 MED ORDER — VANCOMYCIN HCL IN DEXTROSE 750-5 MG/150ML-% IV SOLN
INTRAVENOUS | Status: AC
  Filled 2016-01-04: qty 150

## 2016-01-04 MED ORDER — PREDNISONE 10 MG (21) PO TBPK: ORAL_TABLET | ORAL | Status: DC

## 2016-01-04 MED ORDER — LORAZEPAM 1 MG PO TABS: 1.0000 mg | ORAL_TABLET | Freq: Three times a day (TID) | ORAL | Status: DC

## 2016-01-04 NOTE — Care Management Important Message (Signed)
Important Message  Patient Details  Name: Tyler Aguilar MRN: [id number] Date of Birth: July 22, 1956   Medicare Important Message Given:  Yes    Alvie Heidelberg, RN 01/04/2016, 12:17 PM

## 2016-01-04 NOTE — Discharge Summary (Addendum)
Physician Discharge Summary  Tyler Aguilar:[id number] DOB: [birth year] DOA: 01/02/2016  PCP: Clinton Quant, MD  Admit date: 01/02/2016 Discharge date: 01/04/2016  Time spent: 35 minutes  Recommendations for Outpatient Follow-up:  1. Need to follow up with pulmonologists at [facility] as scheduled.    Discharge Diagnoses:  Principal Problem:   Acute respiratory failure with hypoxia (HCC) Active Problems:   Elevated troponin   Chronic obstructive pulmonary disease with acute exacerbation (HCC)   Mediastinal mass   History of stroke   PVD (peripheral vascular disease) (HCC)   Smoker   Dyslipidemia   RBBB   Discharge Condition: No stridor.  Not significantly SOB.   Diet recommendation: As tolerated.   Filed Weights   01/03/16 0442  Weight: 64.774 kg (142 lb 12.8 oz)    History of present illness: patient was admitted by Dr Hal Hope for SOB on Jan 19, 17.  As per his H and P:  " HEINZ ECKERT is a 60 y.o. male with history of COPD and CVA, abdominal artery aneurysm ongoing tobacco abuse who has had bronchoscopy for mediastinal lymphadenopathy 2 days ago started experiencing shortness of breath last afternoon. Patient initially called EMS and was provided oxygen following which patient's shortness of breath improved. Patient's shortness of breath recurred later in the evening and patient's family provided some nebulizer following which patient's shortness of breath improved. Patient had contacted his pulmonologist and was instructed to come to the nearest ER. CT angiogram of the chest and was negative for PE but does show mucus plugging and possible airspace disease. Patient also was wheezing and was provided nebulizer treatment. Patient has been admitted for further management of his respiratory distress which could be from COPD. Patient's troponin was mildly elevated but patient denies any chest pain. EKG shows sinus rhythm with right bundle branch block. On-call cardiologist was  consulted by ER physician. At this time to have recommended to cycle cardiac markers.    Hospital Course: TARVARES LANT is an 60 y.o. male with hx of recent mediastinal mass, with long standing hx of tobacco use, AAA, COPD, prior CVA, recently had biopsy at [facility] with result pending, admitted for SOB with intermittent stridor, seems to be worse with anxiety, Tx with oral prednisone, antibiotics, and nebs. CTPA showed no PE, but large mass is seen compressing moderately on main stem bronchi, but they remain patent. I noted that he has intermittent stridor and SOB, but when he is more relaxed, he breathed quite normally. With this, he was started on RTC Ativan, and was given Hycodan cough syrup.  He was given IV steroids, and his neb was changed to racemic epinephrines.  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  With this treatments, he improved, and was stable.  He was therefore discharged home to have followed up with his pulmonologist at [facility].  During his stayed, he was also seen by Dr Luan Pulling of pulmonary, who agreed with our managements. He did have a slight elevation of his troponin to 0.04, and had cardiology saw him, recommending no further cardiac work up.  His ECHO was normal.   He will be discharged on Levaquin, along with rapid steroid taper, Ativan RTC, and antitussive RTC.   He will resume his care at [facility].   Thank you for allowing me to participate in his care. Good Day.   Consultations:  Pulmonary.   Discharge Exam: Filed Vitals:   01/04/16 0915 01/04/16 1415  BP:  137/93  Pulse: 92  84  Temp:  98.3 F (36.8 C)  Resp: 20 20    Discharge Instructions   Discharge Instructions    Diet - low sodium heart healthy    Complete by:  As directed      Increase activity slowly    Complete by:  As directed           Discharge Medication List as of 01/04/2016  2:14 PM    START taking these medications   Details  HYDROcodone-homatropine (HYCODAN) 5-1.5  MG/5ML syrup Take 5 mLs by mouth 4 (four) times daily., Starting 01/04/2016, Until Discontinued, Print    levofloxacin (LEVAQUIN) 500 MG tablet Take 1 tablet (500 mg total) by mouth daily., Starting 01/04/2016, Until Discontinued, Normal    LORazepam (ATIVAN) 1 MG tablet Take 1 tablet (1 mg total) by mouth 3 (three) times daily., Starting 01/04/2016, Until Discontinued, Print    predniSONE (STERAPRED UNI-PAK 21 TAB) 10 MG (21) TBPK tablet Use as directed on Package., Print      CONTINUE these medications which have NOT CHANGED   Details  albuterol (PROVENTIL HFA;VENTOLIN HFA) 108 (90 Base) MCG/ACT inhaler Inhale 2 puffs into the lungs every 4 (four) hours as needed for wheezing or shortness of breath., Until Discontinued, Historical Med    atorvastatin (LIPITOR) 80 MG tablet Take 80 mg by mouth every evening., Until Discontinued, Historical Med    budesonide-formoterol (SYMBICORT) 160-4.5 MCG/ACT inhaler Inhale 2 puffs into the lungs 2 (two) times daily., Until Discontinued, Historical Med    propranolol (INDERAL) 20 MG tablet Take 20 mg by mouth 3 (three) times daily., Until Discontinued, Historical Med    pyridOXINE (VITAMIN B-6) 50 MG tablet Take 50 mg by mouth daily., Until Discontinued, Historical Med    tiotropium (SPIRIVA) 18 MCG inhalation capsule Place 18 mcg into inhaler and inhale every evening., Until Discontinued, Historical Med      STOP taking these medications     acetaminophen (TYLENOL) 500 MG tablet      cholecalciferol (VITAMIN D) 1000 units tablet      clopidogrel (PLAVIX) 75 MG tablet      cyanocobalamin 500 MCG tablet      folic acid (FOLVITE) 161 MCG tablet      sildenafil (VIAGRA) 25 MG tablet        No Known Allergies    The results of significant diagnostics from this hospitalization (including imaging, microbiology, ancillary and laboratory) are listed below for reference.    Significant Diagnostic Studies: Dg Chest 2 View  01/02/2016  CLINICAL  DATA:  60 year old male with intermittent severe shortness of breath and wheezing tonight. Recent history of bronchoscopy yesterday. EXAM: CHEST  2 VIEW COMPARISON:  No priors. FINDINGS: Mild diffuse peribronchial cuffing. Lung volumes are normal. No consolidative airspace disease. No pleural effusions. No pneumothorax. No pulmonary nodule or mass noted. Pulmonary vasculature and the cardiomediastinal silhouette are within normal limits. Atherosclerosis in the thoracic aorta. IMPRESSION: 1. Mild diffuse peribronchial cuffing, suggesting an acute bronchitis. 2. Atherosclerosis. Electronically Signed   By: Vinnie Langton M.D.   On: 01/02/2016 22:13   Ct Angio Chest Pe W/cm &/or Wo Cm  01/03/2016  CLINICAL DATA:  Sudden onset intermittent worsening shortness of breath beginning this afternoon. Status post bronchoscopy yesterday. History of COPD and stroke. EXAM: CT ANGIOGRAPHY CHEST WITH CONTRAST TECHNIQUE: Multidetector CT imaging of the chest was performed using the standard protocol during bolus administration of intravenous contrast. Multiplanar CT image reconstructions and MIPs were obtained to evaluate the vascular  anatomy. CONTRAST:  125m OMNIPAQUE IOHEXOL 350 MG/ML SOLN COMPARISON:  Chest radiograph January 02, 2016 FINDINGS: PULMONARY ARTERY: Inadequate pulmonary arterial contrast opacification, 140 Hounsfield units, target is 250 Hounsfield units. Main pulmonary artery is not enlarged. No central pulmonary arterial filling defects. MEDIASTINUM: Hypo enhancing 4.6 x 4.7 x 7.3 cm (AP by transverse by CC) sub carinal mass, moderately narrowing the bilateral mainstem bronchi which remains patent. Soft tissue effaces multiple lower lobe segmental bronchi, however patent subsegmental bronchi. Heart size is normal. Moderate coronary artery calcifications. Moderate calcific atherosclerosis of the aortic arch. LUNGS: Tracheobronchial tree is patent, no pneumothorax. Moderate centrilobular emphysema. Small faint  ground-glass opacity RIGHT upper lobe. No pleural effusions, focal consolidations, parenchymal masses. SOFT TISSUES AND OSSEOUS STRUCTURES: Included view of the abdomen is unremarkable. Visualized soft tissues and included osseous structures appear normal. Review of the MIP images confirms the above findings. IMPRESSION: Suboptimal examination without central pulmonary embolism, on this delayed phase examination. 4.6 x 4.7 x 7.3 cm sub carinal/mediastinal mass moderately narrowing the bilateral mainstem bronchi which remains patent. Differential diagnosis includes infectious or inflammatory lymphadenopathy, lymphoma, metastasis. Mucoid impaction within multiple lower lobe segmental bronchi. Faint RIGHT upper lobe ground-glass opacity may be infectious or inflammatory. Moderate centrilobular emphysema. Electronically Signed   By: CElon AlasM.D.   On: 01/03/2016 02:04    Microbiology: No results found for this or any previous visit (from the past 240 hour(s)).   Labs: Basic Metabolic Panel:  Recent Labs Lab 01/02/16 2240 01/03/16 0630 01/04/16 0615  NA 136 138 138  K 4.7 4.6 4.8  CL 101 103 103  CO2 '27 26 29  '$ GLUCOSE 112* 169* 158*  BUN '13 11 13  '$ CREATININE 0.90 0.75 0.79  CALCIUM 9.2 9.3 9.3   Liver Function Tests:  Recent Labs Lab 01/03/16 0630  AST 38  ALT 53  ALKPHOS 99  BILITOT 0.7  PROT 6.8  ALBUMIN 3.5   CBC:  Recent Labs Lab 01/02/16 2240 01/03/16 0630 01/04/16 0615  WBC 10.3 9.9 9.5  NEUTROABS 6.2 8.5*  --   HGB 13.4 13.9 13.7  HCT 40.3 41.5 41.4  MCV 96.9 96.3 97.9  PLT 268 289 301   Cardiac Enzymes:  Recent Labs Lab 01/02/16 2240 01/03/16 0627 01/03/16 1035 01/03/16 1723  TROPONINI 0.04* 0.04* 0.03 0.04*   CBG:  Recent Labs Lab 01/03/16 0923  GLUCAP 176*    Signed:Orvan FalconerMD.  [facility] 01/04/2016, 6:23 PM

## 2016-01-04 NOTE — Progress Notes (Signed)
SATURATION QUALIFICATIONS: (This note is used to comply with regulatory documentation for home oxygen)  Patient Saturations on Room Air at Rest = 94%  Patient Saturations on Room Air while Ambulating = 92%  Patient Saturations on 0 Liters of oxygen while Ambulating = 92%  Please briefly explain why patient needs home oxygen:Pt. Ambulated 75 feet on room air.

## 2016-01-04 NOTE — Progress Notes (Signed)
Subjective: He had some episodes of severe shortness of breath and stridor-like symptoms yesterday. He says he feels much better today. He is coughing up some milky white sputum. He is still short of breath but says he feels like he is approaching his baseline. 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  Objective: Vital signs in last 24 hours: Temp:  [97.7 F (36.5 C)-98.1 F (36.7 C)] 97.7 F (36.5 C) (01/20 0625) Pulse Rate:  [70-88] 88 (01/20 0625) Resp:  [20-21] 21 (01/20 0625) BP: (118-132)/(76-85) 132/76 mmHg (01/20 0625) SpO2:  [96 %-99 %] 96 % (01/20 0625) Weight change:  Last BM Date: 01/02/16  Intake/Output from previous day: 01/19 0701 - 01/20 0700 In: 958.2 [P.O.:480; I.V.:478.2] Out: -   PHYSICAL EXAM General appearance: alert, cooperative and no distress Resp: wheezes bilaterally Cardio: regular rate and rhythm, S1, S2 normal, no murmur, click, rub or gallop GI: soft, non-tender; bowel sounds normal; no masses,  no organomegaly Extremities: extremities normal, atraumatic, no cyanosis or edema  Lab Results:  Results for orders placed or performed during the hospital encounter of 01/02/16 (from the past 48 hour(s))  CBC with Differential     Status: Abnormal   Collection Time: 01/02/16 10:40 PM  Result Value Ref Range   WBC 10.3 4.0 - 10.5 K/uL   RBC 4.16 (L) 4.22 - 5.81 MIL/uL   Hemoglobin 13.4 13.0 - 17.0 g/dL   HCT 40.3 39.0 - 52.0 %   MCV 96.9 78.0 - 100.0 fL   MCH 32.2 26.0 - 34.0 pg   MCHC 33.3 30.0 - 36.0 g/dL   RDW 13.0 11.5 - 15.5 %   Platelets 268 150 - 400 K/uL   Neutrophils Relative % 59 %   Neutro Abs 6.2 1.7 - 7.7 K/uL   Lymphocytes Relative 28 %   Lymphs Abs 2.8 0.7 - 4.0 K/uL   Monocytes Relative 10 %   Monocytes Absolute 1.0 0.1 - 1.0 K/uL   Eosinophils Relative 3 %   Eosinophils Absolute 0.3 0.0 - 0.7 K/uL   Basophils Relative 0 %   Basophils Absolute 0.0 0.0 - 0.1 K/uL  Basic metabolic panel      Status: Abnormal   Collection Time: 01/02/16 10:40 PM  Result Value Ref Range   Sodium 136 135 - 145 mmol/L   Potassium 4.7 3.5 - 5.1 mmol/L   Chloride 101 101 - 111 mmol/L   CO2 27 22 - 32 mmol/L   Glucose, Bld 112 (H) 65 - 99 mg/dL   BUN 13 6 - 20 mg/dL   Creatinine, Ser 0.90 0.61 - 1.24 mg/dL   Calcium 9.2 8.9 - 10.3 mg/dL   GFR calc non Af Amer >60 >60 mL/min   GFR calc Af Amer >60 >60 mL/min    Comment: (NOTE) The eGFR has been calculated using the CKD EPI equation. This calculation has not been validated in all clinical situations. eGFR's persistently <60 mL/min signify possible Chronic Kidney Disease.    Anion gap 8 5 - 15  Troponin I     Status: Abnormal   Collection Time: 01/02/16 10:40 PM  Result Value Ref Range   Troponin I 0.04 (H) <0.031 ng/mL    Comment:        PERSISTENTLY INCREASED TROPONIN VALUES IN THE RANGE OF 0.04-0.49 ng/mL CAN BE SEEN IN:       -UNSTABLE ANGINA       -CONGESTIVE HEART FAILURE       -  MYOCARDITIS       -CHEST TRAUMA       -ARRYHTHMIAS       -LATE PRESENTING MYOCARDIAL INFARCTION       -COPD   CLINICAL FOLLOW-UP RECOMMENDED.   Troponin I (q 6hr x 3)     Status: Abnormal   Collection Time: 01/03/16  6:27 AM  Result Value Ref Range   Troponin I 0.04 (H) <0.031 ng/mL    Comment:        PERSISTENTLY INCREASED TROPONIN VALUES IN THE RANGE OF 0.04-0.49 ng/mL CAN BE SEEN IN:       -UNSTABLE ANGINA       -CONGESTIVE HEART FAILURE       -MYOCARDITIS       -CHEST TRAUMA       -ARRYHTHMIAS       -LATE PRESENTING MYOCARDIAL INFARCTION       -COPD   CLINICAL FOLLOW-UP RECOMMENDED.   Comprehensive metabolic panel     Status: Abnormal   Collection Time: 01/03/16  6:30 AM  Result Value Ref Range   Sodium 138 135 - 145 mmol/L   Potassium 4.6 3.5 - 5.1 mmol/L   Chloride 103 101 - 111 mmol/L   CO2 26 22 - 32 mmol/L   Glucose, Bld 169 (H) 65 - 99 mg/dL   BUN 11 6 - 20 mg/dL   Creatinine, Ser 0.75 0.61 - 1.24 mg/dL   Calcium 9.3 8.9 -  10.3 mg/dL   Total Protein 6.8 6.5 - 8.1 g/dL   Albumin 3.5 3.5 - 5.0 g/dL   AST 38 15 - 41 U/L   ALT 53 17 - 63 U/L   Alkaline Phosphatase 99 38 - 126 U/L   Total Bilirubin 0.7 0.3 - 1.2 mg/dL   GFR calc non Af Amer >60 >60 mL/min   GFR calc Af Amer >60 >60 mL/min    Comment: (NOTE) The eGFR has been calculated using the CKD EPI equation. This calculation has not been validated in all clinical situations. eGFR's persistently <60 mL/min signify possible Chronic Kidney Disease.    Anion gap 9 5 - 15  CBC WITH DIFFERENTIAL     Status: Abnormal   Collection Time: 01/03/16  6:30 AM  Result Value Ref Range   WBC 9.9 4.0 - 10.5 K/uL   RBC 4.31 4.22 - 5.81 MIL/uL   Hemoglobin 13.9 13.0 - 17.0 g/dL   HCT 41.5 39.0 - 52.0 %   MCV 96.3 78.0 - 100.0 fL   MCH 32.3 26.0 - 34.0 pg   MCHC 33.5 30.0 - 36.0 g/dL   RDW 13.0 11.5 - 15.5 %   Platelets 289 150 - 400 K/uL   Neutrophils Relative % 85 %   Neutro Abs 8.5 (H) 1.7 - 7.7 K/uL   Lymphocytes Relative 12 %   Lymphs Abs 1.2 0.7 - 4.0 K/uL   Monocytes Relative 3 %   Monocytes Absolute 0.3 0.1 - 1.0 K/uL   Eosinophils Relative 0 %   Eosinophils Absolute 0.0 0.0 - 0.7 K/uL   Basophils Relative 0 %   Basophils Absolute 0.0 0.0 - 0.1 K/uL  Glucose, capillary     Status: Abnormal   Collection Time: 01/03/16  9:23 AM  Result Value Ref Range   Glucose-Capillary 176 (H) 65 - 99 mg/dL  Troponin I (q 6hr x 3)     Status: None   Collection Time: 01/03/16 10:35 AM  Result Value Ref Range   Troponin I 0.03 <0.031 ng/mL  Comment:        NO INDICATION OF MYOCARDIAL INJURY.   Troponin I (q 6hr x 3)     Status: Abnormal   Collection Time: 01/03/16  5:23 PM  Result Value Ref Range   Troponin I 0.04 (H) <0.031 ng/mL    Comment:        PERSISTENTLY INCREASED TROPONIN VALUES IN THE RANGE OF 0.04-0.49 ng/mL CAN BE SEEN IN:       -UNSTABLE ANGINA       -CONGESTIVE HEART FAILURE       -MYOCARDITIS       -CHEST TRAUMA       -ARRYHTHMIAS        -LATE PRESENTING MYOCARDIAL INFARCTION       -COPD   CLINICAL FOLLOW-UP RECOMMENDED.   Basic metabolic panel     Status: Abnormal   Collection Time: 01/04/16  6:15 AM  Result Value Ref Range   Sodium 138 135 - 145 mmol/L   Potassium 4.8 3.5 - 5.1 mmol/L   Chloride 103 101 - 111 mmol/L   CO2 29 22 - 32 mmol/L   Glucose, Bld 158 (H) 65 - 99 mg/dL   BUN 13 6 - 20 mg/dL   Creatinine, Ser 0.79 0.61 - 1.24 mg/dL   Calcium 9.3 8.9 - 10.3 mg/dL   GFR calc non Af Amer >60 >60 mL/min   GFR calc Af Amer >60 >60 mL/min    Comment: (NOTE) The eGFR has been calculated using the CKD EPI equation. This calculation has not been validated in all clinical situations. eGFR's persistently <60 mL/min signify possible Chronic Kidney Disease.    Anion gap 6 5 - 15  CBC     Status: None   Collection Time: 01/04/16  6:15 AM  Result Value Ref Range   WBC 9.5 4.0 - 10.5 K/uL   RBC 4.23 4.22 - 5.81 MIL/uL   Hemoglobin 13.7 13.0 - 17.0 g/dL   HCT 41.4 39.0 - 52.0 %   MCV 97.9 78.0 - 100.0 fL   MCH 32.4 26.0 - 34.0 pg   MCHC 33.1 30.0 - 36.0 g/dL   RDW 13.2 11.5 - 15.5 %   Platelets 301 150 - 400 K/uL    ABGS No results for input(s): PHART, PO2ART, TCO2, HCO3 in the last 72 hours.  Invalid input(s): PCO2 CULTURES No results found for this or any previous visit (from the past 240 hour(s)). Studies/Results: Dg Chest 2 View  01/02/2016  CLINICAL DATA:  59 year old male with intermittent severe shortness of breath and wheezing tonight. Recent history of bronchoscopy yesterday. EXAM: CHEST  2 VIEW COMPARISON:  No priors. FINDINGS: Mild diffuse peribronchial cuffing. Lung volumes are normal. No consolidative airspace disease. No pleural effusions. No pneumothorax. No pulmonary nodule or mass noted. Pulmonary vasculature and the cardiomediastinal silhouette are within normal limits. Atherosclerosis in the thoracic aorta. IMPRESSION: 1. Mild diffuse peribronchial cuffing, suggesting an acute bronchitis. 2.  Atherosclerosis. Electronically Signed   By: Vinnie Langton M.D.   On: 01/02/2016 22:13   Ct Angio Chest Pe W/cm &/or Wo Cm  01/03/2016  CLINICAL DATA:  Sudden onset intermittent worsening shortness of breath beginning this afternoon. Status post bronchoscopy yesterday. History of COPD and stroke. EXAM: CT ANGIOGRAPHY CHEST WITH CONTRAST TECHNIQUE: Multidetector CT imaging of the chest was performed using the standard protocol during bolus administration of intravenous contrast. Multiplanar CT image reconstructions and MIPs were obtained to evaluate the vascular anatomy. CONTRAST:  174m OMNIPAQUE IOHEXOL 350 MG/ML SOLN  COMPARISON:  Chest radiograph January 02, 2016 FINDINGS: PULMONARY ARTERY: Inadequate pulmonary arterial contrast opacification, 140 Hounsfield units, target is 250 Hounsfield units. Main pulmonary artery is not enlarged. No central pulmonary arterial filling defects. MEDIASTINUM: Hypo enhancing 4.6 x 4.7 x 7.3 cm (AP by transverse by CC) sub carinal mass, moderately narrowing the bilateral mainstem bronchi which remains patent. Soft tissue effaces multiple lower lobe segmental bronchi, however patent subsegmental bronchi. Heart size is normal. Moderate coronary artery calcifications. Moderate calcific atherosclerosis of the aortic arch. LUNGS: Tracheobronchial tree is patent, no pneumothorax. Moderate centrilobular emphysema. Small faint ground-glass opacity RIGHT upper lobe. No pleural effusions, focal consolidations, parenchymal masses. SOFT TISSUES AND OSSEOUS STRUCTURES: Included view of the abdomen is unremarkable. Visualized soft tissues and included osseous structures appear normal. Review of the MIP images confirms the above findings. IMPRESSION: Suboptimal examination without central pulmonary embolism, on this delayed phase examination. 4.6 x 4.7 x 7.3 cm sub carinal/mediastinal mass moderately narrowing the bilateral mainstem bronchi which remains patent. Differential diagnosis  includes infectious or inflammatory lymphadenopathy, lymphoma, metastasis. Mucoid impaction within multiple lower lobe segmental bronchi. Faint RIGHT upper lobe ground-glass opacity may be infectious or inflammatory. Moderate centrilobular emphysema. Electronically Signed   By: Elon Alas M.D.   On: 01/03/2016 02:04    Medications:  Prior to Admission:  Prescriptions prior to admission  Medication Sig Dispense Refill Last Dose  . acetaminophen (TYLENOL) 500 MG tablet Take 1,000 mg by mouth every 6 (six) hours as needed for mild pain, moderate pain or headache.   01/02/2016 at 1800  . albuterol (PROVENTIL HFA;VENTOLIN HFA) 108 (90 Base) MCG/ACT inhaler Inhale 2 puffs into the lungs every 4 (four) hours as needed for wheezing or shortness of breath.   01/01/2016 at Unknown time  . atorvastatin (LIPITOR) 80 MG tablet Take 80 mg by mouth every evening.   01/01/2016 at Unknown time  . budesonide-formoterol (SYMBICORT) 160-4.5 MCG/ACT inhaler Inhale 2 puffs into the lungs 2 (two) times daily.   01/02/2016 at Unknown time  . cholecalciferol (VITAMIN D) 1000 units tablet Take 1,000 Units by mouth daily.   01/02/2016 at Unknown time  . clopidogrel (PLAVIX) 75 MG tablet Take 75 mg by mouth daily.   01/02/2016 at Unknown time  . cyanocobalamin 500 MCG tablet Take 500 mcg by mouth daily.   01/02/2016 at Unknown time  . folic acid (FOLVITE) 893 MCG tablet Take 400 mcg by mouth daily.   01/02/2016 at Unknown time  . propranolol (INDERAL) 20 MG tablet Take 20 mg by mouth 3 (three) times daily.   01/02/2016 at 800a  . pyridOXINE (VITAMIN B-6) 50 MG tablet Take 50 mg by mouth daily.   01/02/2016 at Unknown time  . sildenafil (VIAGRA) 25 MG tablet Take 25 mg by mouth daily as needed. For erectile dysfunction   unknown  . tiotropium (SPIRIVA) 18 MCG inhalation capsule Place 18 mcg into inhaler and inhale every evening.   01/01/2016 at Unknown time   Scheduled: . atorvastatin  80 mg Oral QPM  . budesonide (PULMICORT)  nebulizer solution  0.25 mg Nebulization BID  . cholecalciferol  1,000 Units Oral Daily  . clopidogrel  75 mg Oral Daily  . enoxaparin (LOVENOX) injection  40 mg Subcutaneous Q24H  . folic acid  734 mcg Oral Daily  . guaiFENesin  600 mg Oral BID  . HYDROcodone-homatropine  5 mL Oral QID  . LORazepam  1 mg Oral TID  . methylPREDNISolone (SOLU-MEDROL) injection  40 mg Intravenous Q6H  .  piperacillin-tazobactam (ZOSYN)  IV  3.375 g Intravenous Q8H  . propranolol  20 mg Oral TID  . pyridOXINE  50 mg Oral Daily  . vancomycin  750 mg Intravenous Q12H  . cyanocobalamin  500 mcg Oral Daily   Continuous:  CZY:[id number] **OR** acetaminophen, ondansetron **OR** ondansetron (ZOFRAN) IV, Racepinephrine HCl  Assesment: He was admitted with acute hypoxic respiratory failure. He has COPD exacerbation. He had a difficult day yesterday but seems to be improving now. He now has a diagnosis of squamous cell cancer. Principal Problem:   Acute respiratory failure with hypoxia (HCC) Active Problems:   Elevated troponin   Chronic obstructive pulmonary disease with acute exacerbation (HCC)   Mediastinal mass   History of stroke   PVD (peripheral vascular disease) ([place])   Smoker   Dyslipidemia   RBBB    Plan: Continue current treatments    LOS: 1 day   Declin Rajan L 01/04/2016, 8:35 AM

## 2016-01-04 NOTE — Progress Notes (Signed)
Fairly normal echo,no further cardiac testing planned at this time. We will sign off of inpatient care.   Zandra Abts MD

## 2016-01-04 NOTE — Care Management Note (Signed)
Case Management Note  Patient Details  Name: Tyler Aguilar MRN: [id number] Date of Birth: Apr 17, 1956  Subjective/Objective:        Spoke with patient and spouse for discharge planning. Patient stated that he is "ready to go home".  Ambulated well in passageway with nurse tech. O2 sats as documented will not qualify for Home O2.    No Cm needs identified other than possible home O2.       Action/Plan: Home with Self care.   Expected Discharge Date:                  Expected Discharge Plan:  Home/Self Care  In-House Referral:     Discharge planning Services  CM Consult  Post Acute Care Choice:    Choice offered to:     DME Arranged:    DME Agency:  [place]:    [place]:  NA  Status of Service:  Completed, signed off  Medicare Important Message Given:  Yes Date Medicare IM Given:    Medicare IM give by:    Date Additional Medicare IM Given:    Additional Medicare Important Message give by:     If discussed at [place] of Stay Meetings, dates discussed:    Additional Comments:  Alvie Heidelberg, RN 01/04/2016, 12:21 PM

## 2016-01-09 DIAGNOSIS — C3490 Malignant neoplasm of unspecified part of unspecified bronchus or lung: Secondary | ICD-10-CM | POA: Insufficient documentation

## 2016-01-10 ENCOUNTER — Emergency Department (HOSPITAL_COMMUNITY): Payer: Medicare PPO

## 2016-01-10 ENCOUNTER — Emergency Department (HOSPITAL_COMMUNITY)
Admission: EM | Admit: 2016-01-10 | Discharge: 2016-01-11 | Disposition: A | Discharge status: Other Acute Inpatient Hospital | Payer: Medicare PPO | Attending: Emergency Medicine | Admitting: Emergency Medicine

## 2016-01-10 ENCOUNTER — Encounter (HOSPITAL_COMMUNITY): Payer: Self-pay

## 2016-01-10 DIAGNOSIS — C3490 Malignant neoplasm of unspecified part of unspecified bronchus or lung: Secondary | ICD-10-CM | POA: Diagnosis not present

## 2016-01-10 DIAGNOSIS — E785 Hyperlipidemia, unspecified: Secondary | ICD-10-CM | POA: Diagnosis not present

## 2016-01-10 DIAGNOSIS — Z8673 Personal history of transient ischemic attack (TIA), and cerebral infarction without residual deficits: Secondary | ICD-10-CM | POA: Diagnosis not present

## 2016-01-10 DIAGNOSIS — J441 Chronic obstructive pulmonary disease with (acute) exacerbation: Secondary | ICD-10-CM | POA: Insufficient documentation

## 2016-01-10 DIAGNOSIS — F17200 Nicotine dependence, unspecified, uncomplicated: Secondary | ICD-10-CM | POA: Insufficient documentation

## 2016-01-10 DIAGNOSIS — R0602 Shortness of breath: Secondary | ICD-10-CM | POA: Diagnosis present

## 2016-01-10 DIAGNOSIS — Z79899 Other long term (current) drug therapy: Secondary | ICD-10-CM | POA: Insufficient documentation

## 2016-01-10 LAB — CBC WITH DIFFERENTIAL/PLATELET
BASOS ABS: 0 10*3/uL (ref 0.0–0.1)
Basophils Relative: 0 %
EOS ABS: 0.2 10*3/uL (ref 0.0–0.7)
EOS PCT: 1 %
HCT: 44.4 % (ref 39.0–52.0)
Hemoglobin: 14.9 g/dL (ref 13.0–17.0)
LYMPHS PCT: 21 %
Lymphs Abs: 3.6 10*3/uL (ref 0.7–4.0)
MCH: 32.3 pg (ref 26.0–34.0)
MCHC: 33.6 g/dL (ref 30.0–36.0)
MCV: 96.3 fL (ref 78.0–100.0)
MONO ABS: 1.9 10*3/uL — AB (ref 0.1–1.0)
Monocytes Relative: 11 %
Neutro Abs: 11.9 10*3/uL — ABNORMAL HIGH (ref 1.7–7.7)
Neutrophils Relative %: 67 %
PLATELETS: 294 10*3/uL (ref 150–400)
RBC: 4.61 MIL/uL (ref 4.22–5.81)
RDW: 13.4 % (ref 11.5–15.5)
WBC: 17.7 10*3/uL — AB (ref 4.0–10.5)

## 2016-01-10 LAB — BASIC METABOLIC PANEL
Anion gap: 9 (ref 5–15)
BUN: 16 mg/dL (ref 6–20)
CALCIUM: 9.4 mg/dL (ref 8.9–10.3)
CHLORIDE: 98 mmol/L — AB (ref 101–111)
CO2: 30 mmol/L (ref 22–32)
CREATININE: 0.9 mg/dL (ref 0.61–1.24)
GFR calc non Af Amer: >60 mL/min (ref >60)
Glucose, Bld: 129 mg/dL — ABNORMAL HIGH (ref 65–99)
Potassium: 4.4 mmol/L (ref 3.5–5.1)
SODIUM: 137 mmol/L (ref 135–145)

## 2016-01-10 LAB — TROPONIN I: TROPONIN I: 0.05 ng/mL — AB (ref <0.031)

## 2016-01-10 MED ORDER — ALBUTEROL (5 MG/ML) CONTINUOUS INHALATION SOLN
10.0000 mg/h | INHALATION_SOLUTION | Freq: Once | RESPIRATORY_TRACT | Status: AC
  Administered 2016-01-10: 10 mg/h via RESPIRATORY_TRACT
  Filled 2016-01-10: qty 20

## 2016-01-10 MED ORDER — LORAZEPAM 1 MG PO TABS: 1.0000 mg | ORAL_TABLET | Freq: Once | ORAL | Status: DC

## 2016-01-10 MED ORDER — ALBUTEROL SULFATE (2.5 MG/3ML) 0.083% IN NEBU
INHALATION_SOLUTION | RESPIRATORY_TRACT | Status: AC
  Filled 2016-01-10: qty 6

## 2016-01-10 MED ORDER — ALBUTEROL SULFATE (2.5 MG/3ML) 0.083% IN NEBU
5.0000 mg | INHALATION_SOLUTION | Freq: Once | RESPIRATORY_TRACT | Status: AC
Start: 2016-01-10 — End: 2016-01-10
  Administered 2016-01-10: 5 mg via RESPIRATORY_TRACT

## 2016-01-10 NOTE — ED Provider Notes (Signed)
Patient is a 60 year old male with COPD and known squamous cell carcinoma of the lung who presents with worsening shortness of breath. Family was trying to bring him to [facility] because that's where he is receiving his pulmonary and oncology care however he was too short of breath.  They called EMS and the patient was given Solu-Medrol and albuterol. He continues to feel short of breath Physical Exam  BP 106/76 mmHg  Pulse 81  Temp(Src) 98.8 F (37.1 C) (Oral)  Resp 26  Ht '5\' 5"'$  (1.651 m)  Wt 67.132 kg  BMI 24.63 kg/m2  SpO2 97%  Physical Exam  Constitutional: He appears well-developed and well-nourished. No distress.  HENT:  Head: Normocephalic and atraumatic.  Right Ear: External ear normal.  Left Ear: External ear normal.  Eyes: Conjunctivae are normal. Right eye exhibits no discharge. Left eye exhibits no discharge. No scleral icterus.  Neck: Neck supple. No tracheal deviation present.  Cardiovascular: Normal rate.   Pulmonary/Chest: Accessory muscle usage present. No stridor. Tachypnea noted. He is in respiratory distress. He has wheezes.  Course wheezes diffusely on exam  Musculoskeletal: He exhibits no edema.  Neurological: He is alert. Cranial nerve deficit: no gross deficits.  Skin: Skin is warm and dry. No rash noted.  Psychiatric: He has a normal mood and affect.  Nursing note and vitals reviewed.   ED Course  Procedures  MDM Patient was treated steroids and albuterol nebulizer treatments. He continues to wheeze and feel short of breath. I think the patient would benefit from admission for a COPD exacerbation.  Patient family would prefer to be admitted at [facility] because that's where he is going to coordinate his care for his lung cancer.  We will consult with [facility] to see if they have bed availability   Medical screening examination/treatment/procedure(s) were conducted as a shared visit with non-physician practitioner(s) and myself.  I personally  evaluated the patient during the encounter.       Dorie Rank, MD 01/10/16 [id number]

## 2016-01-10 NOTE — ED Notes (Signed)
Pt from home, in by [place] for sob, states it started suddenly, denies cp.  Pt receiving albuterol duo neb by ems and has received 125 mg solumedrol iv en route.

## 2016-01-11 DIAGNOSIS — E785 Hyperlipidemia, unspecified: Secondary | ICD-10-CM | POA: Diagnosis not present

## 2016-01-11 DIAGNOSIS — J441 Chronic obstructive pulmonary disease with (acute) exacerbation: Secondary | ICD-10-CM | POA: Diagnosis not present

## 2016-01-11 DIAGNOSIS — Z8673 Personal history of transient ischemic attack (TIA), and cerebral infarction without residual deficits: Secondary | ICD-10-CM | POA: Diagnosis not present

## 2016-01-11 DIAGNOSIS — C3490 Malignant neoplasm of unspecified part of unspecified bronchus or lung: Secondary | ICD-10-CM | POA: Diagnosis not present

## 2016-01-11 DIAGNOSIS — R0602 Shortness of breath: Secondary | ICD-10-CM | POA: Diagnosis present

## 2016-01-11 DIAGNOSIS — Z79899 Other long term (current) drug therapy: Secondary | ICD-10-CM | POA: Diagnosis not present

## 2016-01-11 DIAGNOSIS — F17200 Nicotine dependence, unspecified, uncomplicated: Secondary | ICD-10-CM | POA: Diagnosis not present

## 2016-01-11 LAB — BLOOD GAS, ARTERIAL
Acid-Base Excess: 1.4 mmol/L (ref 0.0–2.0)
Bicarbonate: 25.4 mEq/L — ABNORMAL HIGH (ref 20.0–24.0)
DRAWN BY: 21310
O2 Content: 4 L/min
O2 Saturation: 93.3 %
PH ART: 7.405 (ref 7.350–7.450)
Patient temperature: 37.1
TCO2: 18.6 mmol/L (ref 0–100)
pCO2 arterial: 41.7 mmHg (ref 35.0–45.0)
pO2, Arterial: 70.4 mmHg — ABNORMAL LOW (ref 80.0–100.0)

## 2016-01-11 LAB — TROPONIN I: Troponin I: 0.1 ng/mL — ABNORMAL HIGH (ref <0.031)

## 2016-01-11 MED ORDER — SODIUM CHLORIDE 0.9 % IV BOLUS (SEPSIS)
500.0000 mL | Freq: Once | INTRAVENOUS | Status: AC
  Administered 2016-01-11: 500 mL via INTRAVENOUS

## 2016-01-11 MED ORDER — LORAZEPAM 1 MG PO TABS
1.0000 mg | ORAL_TABLET | Freq: Once | ORAL | Status: AC
  Administered 2016-01-11: 1 mg via ORAL

## 2016-01-11 MED ORDER — LORAZEPAM 1 MG PO TABS
ORAL_TABLET | ORAL | Status: AC
  Filled 2016-01-11: qty 1

## 2016-01-11 NOTE — ED Notes (Signed)
Pt transferred to [place] via [place]

## 2016-01-11 NOTE — ED Provider Notes (Signed)
CSN: [id number]     Arrival date & time 01/10/16  2108 History   First MD Initiated Contact with Patient 01/10/16 2221     Chief Complaint  Patient presents with  . Shortness of Breath     (Consider location/radiation/quality/duration/timing/severity/associated sxs/prior Treatment) The history is provided by the patient.   Tyler Aguilar is a 60 y.o. male with a recent diagnosis of a squamous cell lung cancer, mediastinal mass causing cough, wheezing and increasing shortness of breath for which he is scheduled to see his pulmonologist and oncologist tomorrow at [facility] tomorrow to plan his treatment.  In the interim, he and family endorse increased episodes of shortness of breath with wheezing and coughing which has been uncontrolled causing discomfort and anxiety.  Family reports he has near panic episodes when it gets severe.  He has had numerous albuterol nebulizer treatments today, in addition he received solumedrol prior to arrival by ems. Other home medicines include symbicort and spiriva which he endorses compliance. He is also given ativan for anxiety, most recent dose given at 8 pm. He denies fevers or chills.  He denies chest pain, palpitations, peripheral edema, nausea, vomiting or abdominal pain.  His cough has been occasionally productive of blood streaked sputum.     Past Medical History  Diagnosis Date  . Stroke [facility])     May 02, 2008  . COPD (chronic obstructive pulmonary disease) ()   . AAA (abdominal aortic aneurysm) ([place])   . Dyslipidemia   . Mediastinal mass    Past Surgical History  Procedure Laterality Date  . Finger surgery  1979    right side  . Colonoscopy  08/31/2012    Procedure: COLONOSCOPY;  Surgeon: Jamesetta So, MD;  Location: [facility];  Service: Gastroenterology;  Laterality: N/A;  . Bronchoscopy     Family History  Problem Relation Age of Onset  . Diabetes Mellitus II Mother   . Cancer Mother   . Cancer Father   . Coronary artery disease  Brother     CABG   Social History  Substance Use Topics  . Smoking status: Current Every Day Smoker  . Smokeless tobacco: None  . Alcohol Use: Yes    Review of Systems  Constitutional: Negative for fever.  HENT: Negative for congestion and sore throat.   Eyes: Negative.   Respiratory: Positive for cough, chest tightness, shortness of breath and wheezing.   Cardiovascular: Negative for chest pain, palpitations and leg swelling.  Gastrointestinal: Negative for nausea and abdominal pain.  Genitourinary: Negative.   Musculoskeletal: Negative for joint swelling, arthralgias and neck pain.  Skin: Negative.  Negative for rash and wound.  Neurological: Negative for dizziness, weakness, light-headedness, numbness and headaches.  Psychiatric/Behavioral: Negative.       Allergies  Review of patient's allergies indicates no known allergies.  Home Medications   Prior to Admission medications   Medication Sig Start Date End Date Taking? Authorizing Provider  albuterol (PROVENTIL HFA;VENTOLIN HFA) 108 (90 Base) MCG/ACT inhaler Inhale 2 puffs into the lungs every 4 (four) hours as needed for wheezing or shortness of breath.   Yes Historical Provider, MD  atorvastatin (LIPITOR) 80 MG tablet Take 80 mg by mouth every evening.   Yes Historical Provider, MD  budesonide-formoterol (SYMBICORT) 160-4.5 MCG/ACT inhaler Inhale 2 puffs into the lungs 2 (two) times daily.   Yes Historical Provider, MD  HYDROcodone-homatropine (HYCODAN) 5-1.5 MG/5ML syrup Take 5 mLs by mouth 4 (four) times daily. 01/04/16  Yes Orvan Falconer,  MD  LORazepam (ATIVAN) 1 MG tablet Take 1 tablet (1 mg total) by mouth 3 (three) times daily. 01/04/16  Yes Orvan Falconer, MD  propranolol (INDERAL) 20 MG tablet Take 20 mg by mouth 3 (three) times daily.   Yes Historical Provider, MD  pyridOXINE (VITAMIN B-6) 50 MG tablet Take 50 mg by mouth daily.   Yes Historical Provider, MD  tiotropium (SPIRIVA) 18 MCG inhalation capsule Place 18 mcg into  inhaler and inhale every evening.   Yes Historical Provider, MD  levofloxacin (LEVAQUIN) 500 MG tablet Take 1 tablet (500 mg total) by mouth daily. Patient not taking: Reported on 01/10/2016 01/04/16   Orvan Falconer, MD  predniSONE (STERAPRED UNI-PAK 21 TAB) 10 MG (21) TBPK tablet Use as directed on Package. Patient not taking: Reported on 01/10/2016 01/04/16   Orvan Falconer, MD   BP 105/65 mmHg  Pulse 91  Temp(Src) 98.8 F (37.1 C) (Oral)  Resp 24  Ht '5\' 5"'$  (1.651 m)  Wt 67.132 kg  BMI 24.63 kg/m2  SpO2 96% Physical Exam  Constitutional: He appears well-developed and well-nourished. He appears distressed.  Pt appears uncomfortable.   HENT:  Head: Normocephalic and atraumatic.  Eyes: Conjunctivae are normal.  Neck: Normal range of motion.  Cardiovascular: Normal rate, regular rhythm, normal heart sounds and intact distal pulses.   Pulmonary/Chest: He is in respiratory distress. He has no wheezes. He has rhonchi. He exhibits no tenderness.  Coarse inspiratory, expiratory rhonchi/ wheeze. Prolonged respiration.  No stridor. Breath sounds throughout all lung fields.  Abdominal: Soft. Bowel sounds are normal. There is no tenderness.  Musculoskeletal: Normal range of motion.  Neurological: He is alert.  Skin: Skin is warm and dry.  Psychiatric: He has a normal mood and affect.  Nursing note and vitals reviewed.   ED Course  Procedures (including critical care time) Labs Review Labs Reviewed  BASIC METABOLIC PANEL - Abnormal; Notable for the following:    Chloride 98 (*)    Glucose, Bld 129 (*)    All other components within normal limits  TROPONIN I - Abnormal; Notable for the following:    Troponin I 0.05 (*)    All other components within normal limits  CBC WITH DIFFERENTIAL/PLATELET - Abnormal; Notable for the following:    WBC 17.7 (*)    Neutro Abs 11.9 (*)    Monocytes Absolute 1.9 (*)    All other components within normal limits  TROPONIN I    Imaging Review Dg Chest Port 1  View  01/10/2016  CLINICAL DATA:  Shortness of breath and wheezing for 1 week. Productive cough with bloody sputum for 1 month. Biopsy of the mediastinal mass last week. EXAM: PORTABLE CHEST 1 VIEW COMPARISON:  CT chest 01/03/2016.  Chest radiograph 01/02/2016. FINDINGS: Normal heart size and pulmonary vascularity. No focal airspace disease or consolidation in the lungs. No blunting of costophrenic angles. No pneumothorax. Mediastinal contours appear intact. Calcified and tortuous aorta. IMPRESSION: No active disease. Electronically Signed   By: Lucienne Capers M.D.   On: 01/10/2016 22:13   I have personally reviewed and evaluated these images and lab results as part of my medical decision-making.   EKG Interpretation   Date/Time:  Thursday January 10 2016 21:17:13 EST Ventricular Rate:  87 PR Interval:  157 QRS Duration: 143 QT Interval:  329 QTC Calculation: 396 R Axis:   105 Text Interpretation:  Sinus rhythm Atrial premature complexes Probable  left atrial enlargement RBBB and LPFB Probable inferior infarct, old No  significant change since last tracing Confirmed by KNAPP  MD-J, JON  ([id number]) on 01/11/2016 12:26:07 AM      MDM   Final diagnoses:  COPD exacerbation ([place])  Malignant neoplasm of lung, unspecified laterality, unspecified part of lung ([place])    Prior visits here, [facility] notes reviewed.  Pt with new lung cancer diagnosis with mediastinal mass and increased sob and wheezing today.  Pt with suspected increased COPD, but with mediastinal mass, could also be affecting breathing.  CXR without airspace disease, consolidation or collapse.  Discussed with Dr. Tomi Bamberger who agrees with need to admission.  Pt and family prefer admission to [facility] as this is where his oncology care is established.  Call to Dr. Kelli Churn with [facility] who accepts pt for transfer.     Evalee Jefferson, PA-C 01/11/16 0349  Dorie Rank, MD 01/12/16 [phone]

## 2016-01-30 ENCOUNTER — Encounter (HOSPITAL_COMMUNITY): Payer: Self-pay | Admitting: Hematology & Oncology

## 2016-01-30 ENCOUNTER — Encounter (HOSPITAL_COMMUNITY): Payer: Medicare PPO | Attending: Hematology & Oncology | Admitting: Hematology & Oncology

## 2016-01-30 VITALS — BP 116/87 | HR 86 | Temp 98.5°F | Resp 20 | Ht 65.0 in | Wt 127.0 lb

## 2016-01-30 DIAGNOSIS — R066 Hiccough: Secondary | ICD-10-CM

## 2016-01-30 DIAGNOSIS — C3491 Malignant neoplasm of unspecified part of right bronchus or lung: Secondary | ICD-10-CM

## 2016-01-30 DIAGNOSIS — E871 Hypo-osmolality and hyponatremia: Secondary | ICD-10-CM

## 2016-01-30 DIAGNOSIS — D638 Anemia in other chronic diseases classified elsewhere: Secondary | ICD-10-CM | POA: Diagnosis not present

## 2016-01-30 DIAGNOSIS — J449 Chronic obstructive pulmonary disease, unspecified: Secondary | ICD-10-CM

## 2016-01-30 DIAGNOSIS — I4819 Other persistent atrial fibrillation: Secondary | ICD-10-CM

## 2016-01-30 DIAGNOSIS — J9600 Acute respiratory failure, unspecified whether with hypoxia or hypercapnia: Secondary | ICD-10-CM

## 2016-01-30 DIAGNOSIS — Z8673 Personal history of transient ischemic attack (TIA), and cerebral infarction without residual deficits: Secondary | ICD-10-CM

## 2016-01-30 DIAGNOSIS — R1313 Dysphagia, pharyngeal phase: Secondary | ICD-10-CM

## 2016-01-30 DIAGNOSIS — I480 Paroxysmal atrial fibrillation: Secondary | ICD-10-CM | POA: Diagnosis not present

## 2016-01-30 DIAGNOSIS — R5381 Other malaise: Secondary | ICD-10-CM

## 2016-01-30 DIAGNOSIS — C3411 Malignant neoplasm of upper lobe, right bronchus or lung: Secondary | ICD-10-CM

## 2016-01-30 NOTE — Progress Notes (Signed)
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 male is here because of clinically staged III (T1N2M0) Squamous cell carcinoma of the R lung.  Tyler Aguilar is accompanied by his sister and brother-in-law. He lives with his girlfriend and his sister "lives within throwing distance". He has a history of Left-sided stroke.  Initially thought he had a sinus infection and began taking Allegra, as well as Mucinex. A couple months in, he began coughing up blood. He coughed up large amounts of blood for one day in November of last year. He experienced no more hemoptysis after that episode and did not think much of it afterwards.  He was discharged yesterday from [facility]. He has 14 more radiation treatments planned at [facility]. His breathing has improved greatly with radiation treatment. He met with radiation and medical oncology at [facility], specifically Dr. Mindi Junker and Dr. Maylon Peppers.  The pressure wounds on his skin have improved. He does experience swelling in his feet, he believes from not using his legs. He often crosses his legs while sitting. States "I'm not doing too good" in relation to standing and walking around. He has a stationary bicycle at home and plans to exercise on it  while watching television. His left leg has been weak since his hospitalization and he has been unable to begin exercise yet. Home health has not yet contacted him to set up therapy sessions. Occupational, physical, and speech therapy are supposed to be coming to help him. He notes that in regards to his CVA he was functioning quite independently prior to his hospitalization.  The patient would be interested in a chemotherapy regimen that does not result in hair loss if at all possible. He notes that chemotherapy was recommended after he completed XRT and has had some rehabilitation.  He no longer smokes and does not plan on restarting, stating "I don't want to pour gas on the fire".  He does not remember if he spoke with a Education officer, museum at [facility].   He no longer has trouble swallowing, however he has intermittent hiccupping that makes it feel as though there is something hung up in his throat. He was given baclofen to manage these hiccups. He also eats crushed ice to alleviate hiccupping. "Feels like a ball in my chest". This is the only pain complaint he has.   He takes propranolol for tremors in his left hand from his stroke, given to him by his neurologist. He was also told that it addresses his blood pressure.  He is back at home and reports he has gained weight since returning home. He is eating well and "eats what I want to eat". He does not have Ensure or Boost. He gets around at home in an office chair currently and will be getting a wheelchair later today. He requires assistance to walk around  the house.   Reports he is too weak to get on the exam table today.  His left heel is healing and swollen. He believes while he had a breathing tube in, he must have kicked the bed. When he woke up, his foot was very red and sore.   He takes Ativan to sleep for anxiety related to previously not being able to breath.  Denies any diarrhea. He presents today to establish at [facility].  He would  like to do chemotherapy closer to home.   [facility] was admitted to [facility] after his [facility]oscopy at [facility]:  History of present illness: patient was admitted by Dr Hal Hope for SOB on Jan 19, 17. As per his H and P: " DAL BLEW is a 60 y.o. male with history of COPD and CVA, abdominal artery aneurysm ongoing tobacco abuse who has had bronchoscopy for mediastinal lymphadenopathy 2 days ago started experiencing shortness of breath last afternoon. Patient initially called EMS and was provided oxygen following which patient's shortness of breath improved. Patient's shortness of breath recurred later in the evening and patient's family provided some nebulizer following which patient's shortness of breath improved. Patient had contacted his pulmonologist and was instructed to come to the nearest ER. CT angiogram of the chest and was negative for PE but does show mucus plugging and possible airspace disease. Patient also was wheezing and was provided nebulizer treatment. Patient has been admitted for further management of his respiratory distress which could be from COPD. Patient's troponin was mildly elevated but patient denies any chest pain. EKG shows sinus rhythm with right bundle branch block. On-call cardiologist was consulted by ER physician. At this time to have recommended to cycle cardiac markers.    Hospital Course: Tyler Aguilar is an 60 y.o. male with hx of recent mediastinal mass, with long standing hx of tobacco use, AAA, COPD, prior CVA, recently had biopsy at [facility] with result pending, admitted for SOB with intermittent stridor, seems to be worse with anxiety, Tx with oral prednisone, antibiotics, and nebs. CTPA showed no PE, but large mass is seen compressing moderately on main stem bronchi, but they remain patent. I noted that he has intermittent stridor and SOB, but when he is more relaxed, he breathed quite normally. With this, he was started on RTC Ativan, and was given Hycodan cough syrup.  He was given IV steroids, and his neb was changed to racemic epinephrines. 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 With this treatments, he improved, and was stable. He was therefore discharged home to have followed up with his pulmonologist at [facility]. During his stayed, he was also seen by Dr Luan Pulling of pulmonary, who agreed with our managements. He did have a slight elevation of his troponin to 0.04, and had cardiology saw him, recommending no further cardiac work up. His ECHO was normal. He will be discharged on Levaquin, along with rapid steroid taper, Ativan RTC, and antitussive RTC. He will resume his care at [facility]. Thank you for allowing me to participate in his care.   He represented to [facility] ED on 01/10/2016 with worsening SOB and was transferred to [facility]   MEDICAL HISTORY:  Past Medical History  Diagnosis Date  . Stroke [facility])     May 02, 2008  . COPD (chronic obstructive pulmonary disease) ([place])   . AAA (abdominal aortic aneurysm) ([place])   . Dyslipidemia   . Mediastinal mass     SURGICAL HISTORY: Past Surgical History  Procedure Laterality Date  . Finger surgery  1979    right side  . Colonoscopy  08/31/2012    Procedure: COLONOSCOPY;  Surgeon: Jamesetta So, MD;  Location: [facility] ENDO SUITE;  Service: Gastroenterology;  Laterality: N/A;  . Bronchoscopy      SOCIAL HISTORY: Social History   Social History  . Marital Status: Married    Spouse Name: N/A  . Number of Children: N/A  . Years of Education: N/A   Occupational History  . Not on file.   Social History Main Topics  . Smoking status: Current Every Day Smoker  . Smokeless tobacco: Not on file  . Alcohol Use: Yes  . Drug Use: No  . Sexual Activity: Not on file   Other Topics Concern  . Not on file   Social History Narrative  Currently going through a divorce 0 children Ex smoker. Previously worked as a Dealer.  FAMILY HISTORY: Family History  Problem Relation Age of Onset   . Diabetes Mellitus II Mother   . Cancer Mother   . Cancer Father   . Coronary artery disease Brother     CABG   has no family status information on file.   Father died of a hematoma of the liver at 74 yo Mother died of heart failure at 26 yo  ALLERGIES:  has No Known Allergies.  MEDICATIONS:  Current Outpatient Prescriptions  Medication Sig Dispense Refill  . albuterol (PROVENTIL HFA;VENTOLIN HFA) 108 (90 Base) MCG/ACT inhaler Inhale 2 puffs into the lungs every 4 (four) hours as needed for wheezing or shortness of breath.    Marland Kitchen aspirin 325 MG tablet Take 325 mg by mouth.    Marland Kitchen atorvastatin (LIPITOR) 80 MG tablet Take 80 mg by mouth every evening.    . baclofen (LIORESAL) 10 MG tablet Take 10 mg by mouth.    . budesonide-formoterol (SYMBICORT) 160-4.5 MCG/ACT inhaler Inhale 2 puffs into the lungs 2 (two) times daily.    Marland Kitchen dexamethasone (DECADRON) 2 MG tablet     . HYDROcodone-homatropine (HYCODAN) 5-1.5 MG/5ML syrup Take 5 mLs by mouth 4 (four) times daily. 120 mL 0  . levofloxacin (LEVAQUIN) 500 MG tablet Take 1 tablet (500 mg total) by mouth daily. 5 tablet 0  . LORazepam (ATIVAN) 1 MG tablet Take 1 tablet (1 mg total) by mouth 3 (three) times daily. 30 tablet 0  . magic mouthwash w/lidocaine SOLN Take 30 mLs by mouth.    . ondansetron (ZOFRAN) 8 MG tablet Take 8 mg by mouth.    . oxyCODONE (ROXICODONE) 5 MG immediate release tablet Take 5 mg by mouth.    . pantoprazole (PROTONIX) 40 MG tablet Take 40 mg by mouth.    . predniSONE (STERAPRED UNI-PAK 21 TAB) 10 MG (21) TBPK tablet Use as directed on Package. 21 tablet 0  . propranolol (INDERAL) 20 MG tablet Take 20 mg by mouth 3 (three) times daily.    Marland Kitchen pyridOXINE (VITAMIN B-6) 50 MG tablet Take 50 mg by mouth daily.    . ranitidine (ZANTAC) 150 MG tablet Take 150 mg by mouth daily.    . rivaroxaban (XARELTO) 20 MG TABS tablet Take 20 mg by mouth daily.    . sodium chloride (OCEAN) 0.65 % nasal spray Place into the nose.    .  sucralfate (CARAFATE) 1 GM/10ML suspension Take 1 g by mouth 4 (four) times daily -  before meals and at bedtime.    Marland Kitchen tiotropium (SPIRIVA) 18 MCG inhalation capsule Place  18 mcg into inhaler and inhale every evening.     No current facility-administered medications for this visit.    Review of Systems  Constitutional: Negative for weight loss.  HENT: Negative.        Intermittent hiccupping / feeling of a ball in his throat.  Eyes: Negative.   Respiratory: Negative.   Cardiovascular: Negative.   Gastrointestinal: Negative.  Negative for diarrhea.  Genitourinary: Negative.   Musculoskeletal: Positive for falls.       Falls secondary to leg weakness.  Skin: Negative.   Neurological: Positive for tremors and weakness.       Tremor managed with propranolol. Left leg weakness.  Endo/Heme/Allergies: Negative.   Psychiatric/Behavioral: The patient is nervous/anxious.        Anxiety related to previous breathing issues, managed with Ativan.  All other systems reviewed and are negative.  14 point ROS was done and is otherwise as detailed above or in HPI   PHYSICAL EXAMINATION: ECOG PERFORMANCE STATUS: 2 - Symptomatic, <50% confined to bed  Filed Vitals:   01/30/16 1330  BP: 116/87  Pulse: 86  Temp: 98.5 F (36.9 C)  Resp: 20   Filed Weights   01/30/16 1330  Weight: 127 lb (57.607 kg)    Physical Exam  Constitutional: He is oriented to person, place, and time and well-developed, well-nourished, and in no distress.  In wheelchair. Exam performed in wheelchair. Fatigued. Patient feels unable to get onto exam table today  HENT:  Head: Normocephalic and atraumatic.  Nose: Nose normal.  Mouth/Throat: Oropharynx is clear and moist. No oropharyngeal exudate.  Eyes: Conjunctivae and EOM are normal. Pupils are equal, round, and reactive to light. Right eye exhibits no discharge. Left eye exhibits no discharge. No scleral icterus.  Neck: Normal range of motion. Neck supple. No  tracheal deviation present. No thyromegaly present.  Cardiovascular: Normal rate, regular rhythm and normal heart sounds.  Exam reveals no gallop and no friction rub.   No murmur heard. Pulmonary/Chest: Effort normal. He has no wheezes. He has no rales.  Decreased throughout  Abdominal: Soft. Bowel sounds are normal. He exhibits no distension and no mass. There is no tenderness. There is no rebound and no guarding.  Musculoskeletal: Normal range of motion. He exhibits edema.  Left foot edema noted.  Lymphadenopathy:    He has no cervical adenopathy.  Neurological: He is alert and oriented to person, place, and time. No cranial nerve deficit.  Skin: Skin is warm and dry. No rash noted.  Psychiatric: Mood, memory, affect and judgment normal.  Nursing note and vitals reviewed.   LABORATORY DATA:  I have reviewed the data as listed Lab Results  Component Value Date   WBC 17.7* 01/10/2016   HGB 14.9 01/10/2016   HCT 44.4 01/10/2016   MCV 96.3 01/10/2016   PLT 294 01/10/2016   CMP     Component Value Date/Time   NA 137 01/10/2016 2145   K 4.4 01/10/2016 2145   CL 98* 01/10/2016 2145   CO2 30 01/10/2016 2145   GLUCOSE 129* 01/10/2016 2145   BUN 16 01/10/2016 2145   CREATININE 0.90 01/10/2016 2145   CALCIUM 9.4 01/10/2016 2145   PROT 6.8 01/03/2016 0630   ALBUMIN 3.5 01/03/2016 0630   AST 38 01/03/2016 0630   ALT 53 01/03/2016 0630   ALKPHOS 99 01/03/2016 0630   BILITOT 0.7 01/03/2016 0630   GFRNONAA >60 01/10/2016 2145   GFRAA >60 01/10/2016 2145     RADIOGRAPHIC STUDIES: I  have personally reviewed the radiological images as listed and agreed with the findings in the report.   Study Result     CLINICAL DATA: Sudden onset intermittent worsening shortness of breath beginning this afternoon. Status post bronchoscopy yesterday. History of COPD and stroke.  EXAM: CT ANGIOGRAPHY CHEST WITH CONTRAST  TECHNIQUE: Multidetector CT imaging of the chest was performed  using the standard protocol during bolus administration of intravenous contrast. Multiplanar CT image reconstructions and MIPs were obtained to evaluate the vascular anatomy.  CONTRAST: 153m OMNIPAQUE IOHEXOL 350 MG/ML SOLN  COMPARISON: Chest radiograph January 02, 2016  FINDINGS: PULMONARY ARTERY: Inadequate pulmonary arterial contrast opacification, 140 Hounsfield units, target is 250 Hounsfield units. Main pulmonary artery is not enlarged. No central pulmonary arterial filling defects.  MEDIASTINUM: Hypo enhancing 4.6 x 4.7 x 7.3 cm (AP by transverse by CC) sub carinal mass, moderately narrowing the bilateral mainstem bronchi which remains patent. Soft tissue effaces multiple lower lobe segmental bronchi, however patent subsegmental bronchi. Heart size is normal. Moderate coronary artery calcifications. Moderate calcific atherosclerosis of the aortic arch.  LUNGS: Tracheobronchial tree is patent, no pneumothorax. Moderate centrilobular emphysema. Small faint ground-glass opacity RIGHT upper lobe. No pleural effusions, focal consolidations, parenchymal masses.  SOFT TISSUES AND OSSEOUS STRUCTURES: Included view of the abdomen is unremarkable. Visualized soft tissues and included osseous structures appear normal.  Review of the MIP images confirms the above findings.  IMPRESSION: Suboptimal examination without central pulmonary embolism, on this delayed phase examination.  4.6 x 4.7 x 7.3 cm sub carinal/mediastinal mass moderately narrowing the bilateral mainstem bronchi which remains patent. Differential diagnosis includes infectious or inflammatory lymphadenopathy, lymphoma, metastasis. Mucoid impaction within multiple lower lobe segmental bronchi.  Faint RIGHT upper lobe ground-glass opacity may be infectious or inflammatory. Moderate centrilobular emphysema.   Electronically Signed  By: CElon AlasM.D.  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 (Incompletely staged) EBUS FNA subcarinal mass, squamous cell carcinoma 01/01/2016 [facility] palliation of [facility] lung [facility] Dysphagis, mild oral and mild moderate pharyngeal dysphagia Paroxysmal afib with EF 35% on ECHO 01/22/2016 Intractable hiccups on Baclofen 10 mg po tid Anemia, chronic disease Hyponatremia Acute hypoxic respiratory failure, COPD History of CVA with residual L sided weakness Urgent XRT secondary to airway compromise from NSCLC  [facility]  He will then follow-up with medical oncology there. We discussed potential chemotherapy regimens. He would like to do treatment here and we are certainly glad to accommodate him locally.   He has been referred to therapy at home with home health. I advised the patient and his family that if they have difficulty with this referral to let uKoreaknow and we can help.  I will refer the patient to our social worker, GAbby Potash We discussed nutrition today.   His ejection fraction needs to be checked again after radiation treatment is completed. I will order an echocardiogram at that time. If he needs [facility] referral [facility]  The patient met with HHildred Alamin our patient navigator, today.  He does not need any refills today.  He will return in 2 weeks. At this next visit, we will discuss plans for chemotherapy and having a port placed. In addition we will of course reassess his PS and wait [facility]  All questions were answered. The patient knows to  call the clinic with any problems, questions or concerns.  This document serves as a record of services personally performed by Ancil Linsey, MD. It was created on her behalf by Arlyce Harman, a trained medical scribe. The creation of this record is based on the scribe's personal observations  and the provider's statements to them. This document has been checked and approved by the attending provider.  I have reviewed the above documentation for accuracy and completeness, and I agree with the above.  This note was electronically signed.    Molli Hazard, MD  01/30/2016 2:29 PM

## 2016-01-30 NOTE — Patient Instructions (Addendum)
[place] at [facility] Discharge Instructions  RECOMMENDATIONS MADE BY THE CONSULTANT AND ANY TEST RESULTS WILL BE SENT TO YOUR REFERRING PHYSICIAN.   Exam and discussion by Dr Whitney Muse today We can get back together in about a week, and talk about how to treat you with chemotherapy after radiation is completed. Scan of your brain was good Stage 3 Lung Cancer-treatment is usually radiation and chemotherapy usually together.  But you are going to do better if you complete radiation first. Loren Racer is our Education officer, museum, she is here on Tuesdays, but if you ever need her she can get in touch with you. Return to see the doctor in 2 week Dr Whitney Muse will talk with [facility] about what type of treatment should be the best option.   We need to get you access (port-a-cath) scheduled to be placed  Lupita Raider is our nurse navigator, she is a good resource when you first get started here, if you have any questions please call her.  [phone] Please call the clinic if you have any questions or concerns     Thank you for choosing [place] at [facility] to provide your oncology and hematology care.  To afford each patient quality time with our provider, please arrive at least 15 minutes before your scheduled appointment time.   Beginning January 23rd 2017 lab work for the [facility] will be done in the  Main lab at [facility] on 1st floor. If you have a lab appointment with the [place] please come in thru the  Main Entrance and check in at the main information desk  You need to re-schedule your appointment should you arrive 10 or more minutes late.  We strive to give you quality time with our providers, and arriving late affects you and other patients whose appointments are after yours.  Also, if you no show three or more times for appointments you may be dismissed from the clinic at the providers discretion.     Again, thank you for  choosing [facility].  Our hope is that these requests will decrease the amount of time that you wait before being seen by our physicians.       _____________________________________________________________  Should you have questions after your visit to [facility], please contact our office at [phone] between the hours of 8:30 a.m. and 4:30 p.m.  Voicemails left after 4:30 p.m. will not be returned until the following business day.  For prescription refill requests, have your pharmacy contact our office.

## 2016-02-04 ENCOUNTER — Other Ambulatory Visit (HOSPITAL_COMMUNITY): Payer: Self-pay | Admitting: *Deleted

## 2016-02-12 DIAGNOSIS — C3490 Malignant neoplasm of unspecified part of unspecified bronchus or lung: Secondary | ICD-10-CM | POA: Insufficient documentation

## 2016-02-12 DIAGNOSIS — I4819 Other persistent atrial fibrillation: Secondary | ICD-10-CM | POA: Insufficient documentation

## 2016-02-12 DIAGNOSIS — R5381 Other malaise: Secondary | ICD-10-CM | POA: Insufficient documentation

## 2016-02-14 ENCOUNTER — Encounter (HOSPITAL_COMMUNITY): Payer: Self-pay | Admitting: Lab

## 2016-02-14 ENCOUNTER — Encounter (HOSPITAL_COMMUNITY): Payer: Self-pay | Admitting: Hematology & Oncology

## 2016-02-14 ENCOUNTER — Encounter (HOSPITAL_COMMUNITY): Payer: Medicare PPO | Attending: Hematology & Oncology | Admitting: Hematology & Oncology

## 2016-02-14 VITALS — BP 101/52 | HR 85 | Temp 98.2°F | Resp 18 | Wt 130.0 lb

## 2016-02-14 DIAGNOSIS — C3491 Malignant neoplasm of unspecified part of right bronchus or lung: Secondary | ICD-10-CM | POA: Insufficient documentation

## 2016-02-14 DIAGNOSIS — J449 Chronic obstructive pulmonary disease, unspecified: Secondary | ICD-10-CM

## 2016-02-14 DIAGNOSIS — R058 Other specified cough: Secondary | ICD-10-CM

## 2016-02-14 DIAGNOSIS — I480 Paroxysmal atrial fibrillation: Secondary | ICD-10-CM

## 2016-02-14 DIAGNOSIS — R1313 Dysphagia, pharyngeal phase: Secondary | ICD-10-CM

## 2016-02-14 DIAGNOSIS — R5381 Other malaise: Secondary | ICD-10-CM

## 2016-02-14 DIAGNOSIS — C3411 Malignant neoplasm of upper lobe, right bronchus or lung: Secondary | ICD-10-CM | POA: Diagnosis not present

## 2016-02-14 DIAGNOSIS — D638 Anemia in other chronic diseases classified elsewhere: Secondary | ICD-10-CM | POA: Diagnosis not present

## 2016-02-14 DIAGNOSIS — Z8673 Personal history of transient ischemic attack (TIA), and cerebral infarction without residual deficits: Secondary | ICD-10-CM

## 2016-02-14 DIAGNOSIS — R05 Cough: Secondary | ICD-10-CM

## 2016-02-14 DIAGNOSIS — R938 Abnormal findings on diagnostic imaging of other specified body structures: Secondary | ICD-10-CM | POA: Insufficient documentation

## 2016-02-14 DIAGNOSIS — R066 Hiccough: Secondary | ICD-10-CM

## 2016-02-14 DIAGNOSIS — R042 Hemoptysis: Secondary | ICD-10-CM | POA: Insufficient documentation

## 2016-02-14 DIAGNOSIS — J9601 Acute respiratory failure with hypoxia: Secondary | ICD-10-CM | POA: Diagnosis not present

## 2016-02-14 DIAGNOSIS — J9600 Acute respiratory failure, unspecified whether with hypoxia or hypercapnia: Secondary | ICD-10-CM

## 2016-02-14 LAB — CBC WITH DIFFERENTIAL/PLATELET
BASOS ABS: 0 10*3/uL (ref 0.0–0.1)
BASOS PCT: 0 %
EOS ABS: 0 10*3/uL (ref 0.0–0.7)
EOS PCT: 0 %
HEMATOCRIT: 35.3 % — AB (ref 39.0–52.0)
Hemoglobin: 11.5 g/dL — ABNORMAL LOW (ref 13.0–17.0)
Lymphocytes Relative: 4 %
Lymphs Abs: 0.3 10*3/uL — ABNORMAL LOW (ref 0.7–4.0)
MCH: 31.9 pg (ref 26.0–34.0)
MCHC: 32.6 g/dL (ref 30.0–36.0)
MCV: 97.8 fL (ref 78.0–100.0)
MONO ABS: 0.4 10*3/uL (ref 0.1–1.0)
Monocytes Relative: 5 %
NEUTROS ABS: 8.1 10*3/uL — AB (ref 1.7–7.7)
Neutrophils Relative %: 91 %
PLATELETS: 203 10*3/uL (ref 150–400)
RBC: 3.61 MIL/uL — ABNORMAL LOW (ref 4.22–5.81)
RDW: 16.2 % — AB (ref 11.5–15.5)
WBC: 8.9 10*3/uL (ref 4.0–10.5)

## 2016-02-14 LAB — COMPREHENSIVE METABOLIC PANEL
ALK PHOS: 61 U/L (ref 38–126)
ALT: 37 U/L (ref 17–63)
ANION GAP: 7 (ref 5–15)
AST: 23 U/L (ref 15–41)
Albumin: 2.5 g/dL — ABNORMAL LOW (ref 3.5–5.0)
BILIRUBIN TOTAL: 0.6 mg/dL (ref 0.3–1.2)
BUN: 11 mg/dL (ref 6–20)
CALCIUM: 8.5 mg/dL — AB (ref 8.9–10.3)
CO2: 28 mmol/L (ref 22–32)
Chloride: 100 mmol/L — ABNORMAL LOW (ref 101–111)
Creatinine, Ser: 0.49 mg/dL — ABNORMAL LOW (ref 0.61–1.24)
GLUCOSE: 152 mg/dL — AB (ref 65–99)
Potassium: 3.9 mmol/L (ref 3.5–5.1)
Sodium: 135 mmol/L (ref 135–145)
Total Protein: 6.1 g/dL — ABNORMAL LOW (ref 6.5–8.1)

## 2016-02-14 LAB — VITAMIN B12: VITAMIN B 12: 1161 pg/mL — AB (ref 180–914)

## 2016-02-14 LAB — FERRITIN: Ferritin: 748 ng/mL — ABNORMAL HIGH (ref 24–336)

## 2016-02-14 LAB — FOLATE: Folate: 74.1 ng/mL (ref >5.9)

## 2016-02-14 MED ORDER — LEVOFLOXACIN 500 MG PO TABS: 500.0000 mg | ORAL_TABLET | Freq: Every day | ORAL | Status: DC

## 2016-02-14 NOTE — Progress Notes (Signed)
Tyler Aguilar's reason for visit today are for labs as scheduled per MD orders.  Venipuncture performed with Tyler 23 gauge butterfly needle to L Antecubital.  Tyler Aguilar Tyler Aguilar tolerated venipuncture well and without incident; questions were answered and patient was discharged.

## 2016-02-14 NOTE — Patient Instructions (Addendum)
[place] at [facility] Discharge Instructions  RECOMMENDATIONS MADE BY THE CONSULTANT AND ANY TEST RESULTS WILL BE SENT TO YOUR REFERRING PHYSICIAN.    Exam and discussion by Dr Whitney Muse today  Lab work today, we will call you with those results  Antibiotic (levaquin for 14 days) called in to your pharmacy([facility] in [place]), please call us in the next few days if you are not getting any better.   Go to your cardiology appt and if they just want to follow up then we can get you to see [facility]  Cardiology here at [facility].  We are going to get the records for XRT from [facility] placement in about 2 weeks with Dr Arnoldo Morale  Return to see the doctor after you have had your port-a-cath placement Please call the clinic if you have any questions or concerns    Thank you for choosing [place] at [facility] to provide your oncology and hematology care.  To afford each patient quality time with our provider, please arrive at least 15 minutes before your scheduled appointment time.   Beginning January 23rd 2017 lab work for the [facility] will be done in the  Main lab at [facility] on 1st floor. If you have a lab appointment with the [place] please come in thru the  Main Entrance and check in at the main information desk  You need to re-schedule your appointment should you arrive 10 or more minutes late.  We strive to give you quality time with our providers, and arriving late affects you and other patients whose appointments are after yours.  Also, if you no show three or more times for appointments you may be dismissed from the clinic at the providers discretion.     Again, thank you for choosing [facility].  Our hope is that these requests will decrease the amount of time that you wait before being seen by our physicians.       _____________________________________________________________  Should you  have questions after your visit to [facility], please contact our office at [phone] between the hours of 8:30 a.m. and 4:30 p.m.  Voicemails left after 4:30 p.m. will not be returned until the following business day.  For prescription refill requests, have your pharmacy contact our office.         Resources For Cancer Patients and their Caregivers ? American Cancer Society: Can assist with transportation, wigs, general needs, runs Look Good Feel Better.        715-123-3806 ? Cancer Care: Provides financial assistance, online support groups, medication/co-pay assistance.  1-800-813-HOPE 727-171-5586) ? [place] Assists [place] cancer patients and their families through emotional , educational and financial support.  (331) 860-3600 ? [place] DSS Where to apply for food stamps, Medicaid and utility assistance. 667-076-5376 ? [employer]: Transportation to medical appointments. 806-861-8571 ? Social Security Administration: May apply for disability if have a Stage IV cancer. 706-073-8465 502-602-3480 ? [employer], Disability and Transit Services: Assists with nutrition, care and transit needs. [phone]

## 2016-02-14 NOTE — Progress Notes (Signed)
Referral to Dr Arnoldo Morale for port.  Appt 3/7.  Records faxed on 3/2

## 2016-02-14 NOTE — Progress Notes (Signed)
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 male is here because of clinically staged III (T1N2M0) Squamous cell carcinoma of the R lung.  Tyler Aguilar was here with his girlfriend and a male friend.  He stated that he has shortness of breath and that he is sometimes coughing up congestion. He says that at night the congestion is worse and that he can hear rattling when he coughs. He says that he gets up several times during the night to cough something up and then lays back down. His sister notes that his respiratory symptoms started when he was tapering off his steroids.   He had a physical therapy evaluation and he states that he feels okay about this. He walks for 6 mins around the gym and he did not get out of breath during this and did not have to stop. He said that he wants to get back to where he was before his stroke and that he has been working on this. He continues to have left leg weakness.   He is currently on several inhalers and has been using breathing treatments.  His appetite has gotten better and he states that he has been "eating like a horse"  He was scheduled for a cardiac appointment by his other physician at [facility].  Patient states that he has  4 radiation treatments to go and finishing up on Tuesday. He is in his 3rd stage of tapering off steroids. He stopped taking Levaquin last week.    He was admitted to [facility] after his bronchoscopy at [facility]:  History of present illness: patient was admitted by Dr Hal Hope for SOB on Jan 19, 17. As per his H and P: " Tyler Aguilar is a 60 y.o. male with history of COPD and CVA, abdominal artery aneurysm ongoing tobacco abuse who has had bronchoscopy for mediastinal lymphadenopathy 2 days ago started experiencing shortness of breath last afternoon. Patient initially called EMS and was provided oxygen following which patient's shortness of breath improved. Patient's shortness of breath recurred later in the evening and patient's family provided some nebulizer following which patient's shortness of breath improved. Patient had contacted his pulmonologist and was instructed to come to the nearest ER. CT angiogram of the chest and was negative for PE but does show mucus plugging and possible airspace disease. Patient also was wheezing and was provided nebulizer treatment. Patient has been admitted for further management of his respiratory distress which could be from COPD. Patient's troponin was mildly elevated but patient denies any chest pain. EKG shows sinus rhythm with right bundle branch block. On-call cardiologist was consulted by ER physician. At this time to have recommended to cycle cardiac markers.    Hospital Course: Tyler Aguilar is an 60 y.o. male with hx of recent mediastinal mass, with long standing hx of tobacco use,  AAA, COPD, prior CVA, recently had biopsy at [facility] with result pending, admitted for SOB with intermittent stridor, seems to be worse with anxiety, Tx with oral prednisone, antibiotics, and nebs. CTPA showed no PE, but large mass is seen compressing moderately on main stem bronchi, but they remain patent. I noted that he has intermittent stridor and SOB, but when he is more relaxed,  he breathed quite normally. With this, he was started on RTC Ativan, and was given Hycodan cough syrup. He was given IV steroids, and his neb was changed to racemic epinephrines. 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 With this treatments, he improved, and was stable. He was therefore discharged home to have followed up with his pulmonologist at [facility]. During his stayed, he was also seen by Dr Luan Pulling of pulmonary, who agreed with our managements. He did have a slight elevation of his troponin to 0.04, and had cardiology saw him, recommending no further cardiac work up. His ECHO was normal. He will be discharged on Levaquin, along with rapid steroid taper, Ativan RTC, and antitussive RTC. He will resume his care at [facility]. Thank you for allowing me to participate in his care.    MEDICAL HISTORY:  Past Medical History  Diagnosis Date  . Stroke Tyler Aguilar)     May 02, 2008  . COPD (chronic obstructive pulmonary disease) ([place])   . AAA (abdominal aortic aneurysm) ([place])   . Dyslipidemia   . Mediastinal mass     SURGICAL HISTORY: Past Surgical History  Procedure Laterality Date  . Finger surgery  1979    right side  . Colonoscopy  08/31/2012    Procedure: COLONOSCOPY;  Surgeon: Jamesetta So, MD;  Location: [facility] ENDO SUITE;  Service: Gastroenterology;  Laterality: N/A;  . Bronchoscopy      SOCIAL HISTORY: Social History   Social History  . Marital Status: Married    Spouse Name: N/A  . Number of Children: N/A  . Years of Education: N/A   Occupational History  . Not on file.   Social History Main Topics  . Smoking status: Current Every Day Smoker  . Smokeless tobacco: Not on file  . Alcohol Use: Yes  . Drug Use: No  . Sexual Activity: Not on file   Other Topics Concern  . Not on file   Social History Narrative  Currently going through a divorce 0 children Ex smoker. Previously worked as a Dealer.  FAMILY HISTORY: Family History  Problem  Relation Age of Onset  . Diabetes Mellitus II Mother   . Cancer Mother   . Cancer Father   . Coronary artery disease Brother     CABG   has no family status information on file.   Father died of a hematoma of the liver at 92 yo Mother died of heart failure at 51 yo  ALLERGIES:  has No Known Allergies.  MEDICATIONS:  Current Outpatient Prescriptions  Medication Sig Dispense Refill  . albuterol (PROVENTIL HFA;VENTOLIN HFA) 108 (90 Base) MCG/ACT inhaler Inhale 2 puffs into the lungs every 4 (four) hours as needed for wheezing or shortness of breath.    Marland Kitchen aspirin 325 MG tablet Take 325 mg by mouth.    Marland Kitchen atorvastatin (LIPITOR) 80 MG tablet Take 80 mg by mouth every evening.    . baclofen (LIORESAL) 10 MG tablet Take 10 mg by mouth.    . budesonide-formoterol (SYMBICORT) 160-4.5 MCG/ACT inhaler Inhale 2 puffs into the lungs 2 (two) times daily.    Marland Kitchen  dexamethasone (DECADRON) 2 MG tablet     . HYDROcodone-homatropine (HYCODAN) 5-1.5 MG/5ML syrup Take 5 mLs by mouth 4 (four) times daily. 120 mL 0  . levofloxacin (LEVAQUIN) 500 MG tablet Take 1 tablet (500 mg total) by mouth daily. 5 tablet 0  . LORazepam (ATIVAN) 1 MG tablet Take 1 tablet (1 mg total) by mouth 3 (three) times daily. 30 tablet 0  . magic mouthwash w/lidocaine SOLN Take 30 mLs by mouth.    . ondansetron (ZOFRAN) 8 MG tablet Take 8 mg by mouth.    . oxyCODONE (ROXICODONE) 5 MG immediate release tablet Take 5 mg by mouth.    . pantoprazole (PROTONIX) 40 MG tablet Take 40 mg by mouth.    . predniSONE (STERAPRED UNI-PAK 21 TAB) 10 MG (21) TBPK tablet Use as directed on Package. 21 tablet 0  . propranolol (INDERAL) 20 MG tablet Take 20 mg by mouth 3 (three) times daily.    Marland Kitchen pyridOXINE (VITAMIN B-6) 50 MG tablet Take 50 mg by mouth daily.    . ranitidine (ZANTAC) 150 MG tablet Take 150 mg by mouth daily.    . rivaroxaban (XARELTO) 20 MG TABS tablet Take 20 mg by mouth daily.    . sodium chloride (OCEAN) 0.65 % nasal spray Place  into the nose.    . sucralfate (CARAFATE) 1 GM/10ML suspension Take 1 g by mouth 4 (four) times daily -  before meals and at bedtime.    Marland Kitchen tiotropium (SPIRIVA) 18 MCG inhalation capsule Place 18 mcg into inhaler and inhale every evening.     No current facility-administered medications for this visit.    Review of Systems  Constitutional: Negative for weight loss.  HENT: Positive for congestion.        Congestion worse at night  Eyes: Negative.   Respiratory: Positive for cough, sputum production and shortness of breath.   Cardiovascular: Negative.   Gastrointestinal: Negative.  Negative for diarrhea.  Genitourinary: Negative.   Musculoskeletal: Positive for falls.       Falls secondary to leg weakness.  Skin: Negative.   Neurological: Positive for tremors and weakness.       Tremor managed with propranolol. Left leg weakness.  Endo/Heme/Allergies: Negative.   Psychiatric/Behavioral: The patient is nervous/anxious.        Anxiety related to previous breathing issues, managed with Ativan.  All other systems reviewed and are negative.  14 point ROS was done and is otherwise as detailed above or in HPI   PHYSICAL EXAMINATION: ECOG PERFORMANCE STATUS: 2 - Symptomatic, <50% confined to bed  Filed Vitals:   02/14/16 0818  BP: 101/52  Pulse: 85  Temp: 98.2 F (36.8 C)  Resp: 18   Filed Weights   02/14/16 0818  Weight: 130 lb (58.968 kg)    Physical Exam  Constitutional: He is oriented to person, place, and time and well-developed, well-nourished, and in no distress.  In wheelchair. Gets onto the exam table today with assistance  HENT:  Head: Normocephalic and atraumatic.  Nose: Nose normal.  Mouth/Throat: Oropharynx is clear and moist. No oropharyngeal exudate.  Eyes: Conjunctivae and EOM are normal. Pupils are equal, round, and reactive to light. Right eye exhibits no discharge. Left eye exhibits no discharge. No scleral icterus.  Neck: Normal range of motion. Neck  supple. No tracheal deviation present. No thyromegaly present.  Cardiovascular: Normal rate, regular rhythm and normal heart sounds.  Exam reveals no gallop and no friction rub.   No murmur heard. Pulmonary/Chest:  Effort normal. He has no wheezes. He has no rales.  Coarse breath sounds worse on left than right and  decreased breath sounds throughout.   Abdominal: Soft. Bowel sounds are normal. He exhibits no distension and no mass. There is no tenderness. There is no rebound and no guarding.  Musculoskeletal: Normal range of motion. He exhibits no edema.  Lymphadenopathy:    He has no cervical adenopathy.  Neurological: He is alert and oriented to person, place, and time. No cranial nerve deficit.  Skin: Skin is warm and dry. No rash noted.  Psychiatric: Mood, memory, affect and judgment normal.  Nursing note and vitals reviewed.  LABORATORY DATA:  I have reviewed the data as listed  Results for Tyler Aguilar, Tyler Aguilar (MRN [id number]) as of 02/14/2016 09:40  Ref. Range 02/14/2016 08:54  WBC Latest Ref Range: 4.0-10.5 K/uL 8.9  RBC Latest Ref Range: 4.22-5.81 MIL/uL 3.61 (L)  Hemoglobin Latest Ref Range: 13.0-17.0 g/dL 11.5 (L)  HCT Latest Ref Range: 39.0-52.0 % 35.3 (L)  MCV Latest Ref Range: 78.0-100.0 fL 97.8  MCH Latest Ref Range: 26.0-34.0 pg 31.9  MCHC Latest Ref Range: 30.0-36.0 g/dL 32.6  RDW Latest Ref Range: 11.5-15.5 % 16.2 (H)  Platelets Latest Ref Range: 150-400 K/uL 203  Neutrophils Latest Units: % 91  Lymphocytes Latest Units: % 4  Monocytes Relative Latest Units: % 5  Eosinophil Latest Units: % 0  Basophil Latest Units: % 0  NEUT# Latest Ref Range: 1.7-7.7 K/uL 8.1 (H)  Lymphocyte # Latest Ref Range: 0.7-4.0 K/uL 0.3 (L)  Monocyte # Latest Ref Range: 0.1-1.0 K/uL 0.4  Eosinophils Absolute Latest Ref Range: 0.0-0.7 K/uL 0.0  Basophils Absolute Latest Ref Range: 0.0-0.1 K/uL 0.0    RADIOGRAPHIC STUDIES: I have personally reviewed the radiological images as listed and agreed  with the findings in the report.  Result Impression   1.  Interval increase in interstitial and airspace opacities in the left lung base concerning for pneumonitis/pneumonia.  2. Subcarinal and hilar lymphadenopathy.   Result Narrative  XR CHEST PA AND LATERAL, 02/13/2016 12:42 PM  INDICATION: cough/dyspneaR05 Cough  COMPARISON: Multiple priors, including most recent chest radiograph dated 01/20/2016  FINDINGS:   Cardiovascular: Similar cardiopericardial silhouette. Tortuous and atherosclerotic thoracic aorta.     Mediastinum: Subcarinal and hilar lymphadenopathy.     Lungs/pleura: Interval increase in interstitial and airspace opacities in the left lung base.     Upper abdomen: Visualized portions are unremarkable.  Chest wall/osseous structures: Unremarkable.     Study Result     CLINICAL DATA: Sudden onset intermittent worsening shortness of breath beginning this afternoon. Status post bronchoscopy yesterday. History of COPD and stroke.  EXAM: CT ANGIOGRAPHY CHEST WITH CONTRAST  TECHNIQUE: Multidetector CT imaging of the chest was performed using the standard protocol during bolus administration of intravenous contrast. Multiplanar CT image reconstructions and MIPs were obtained to evaluate the vascular anatomy.  CONTRAST: 137m OMNIPAQUE IOHEXOL 350 MG/ML SOLN  COMPARISON: Chest radiograph January 02, 2016  FINDINGS: PULMONARY ARTERY: Inadequate pulmonary arterial contrast opacification, 140 Hounsfield units, target is 250 Hounsfield units. Main pulmonary artery is not enlarged. No central pulmonary arterial filling defects.  MEDIASTINUM: Hypo enhancing 4.6 x 4.7 x 7.3 cm (AP by transverse by CC) sub carinal mass, moderately narrowing the bilateral mainstem bronchi which remains patent. Soft tissue effaces multiple lower lobe segmental bronchi, however patent subsegmental bronchi. Heart size is normal. Moderate coronary artery calcifications.  Moderate calcific atherosclerosis of the aortic arch.  LUNGS: Tracheobronchial tree is  patent, no pneumothorax. Moderate centrilobular emphysema. Small faint ground-glass opacity RIGHT upper lobe. No pleural effusions, focal consolidations, parenchymal masses.  SOFT TISSUES AND OSSEOUS STRUCTURES: Included view of the abdomen is unremarkable. Visualized soft tissues and included osseous structures appear normal.  Review of the MIP images confirms the above findings.  IMPRESSION: Suboptimal examination without central pulmonary embolism, on this delayed phase examination.  4.6 x 4.7 x 7.3 cm sub carinal/mediastinal mass moderately narrowing the bilateral mainstem bronchi which remains patent. Differential diagnosis includes infectious or inflammatory lymphadenopathy, lymphoma, metastasis. Mucoid impaction within multiple lower lobe segmental bronchi.  Faint RIGHT upper lobe ground-glass opacity may be infectious or inflammatory. Moderate centrilobular emphysema.   Electronically Signed  By: Tyler Aguilar M.D.  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  He will then follow-up with medical oncology there. We discussed potential chemotherapy regimens. He would like to do treatment here and we are certainly glad to accommodate him locally.   Patient will go to his cardiac appointment at [facility] and  then will be referred to [facility] for future cardiac followup.   I will prescribe 14 day course of Levaquin based on respiratory symptoms. He was advised to continue his prednisone taper. Xray report from [facility] was read. He may need a longer course of prednisone but would extend his antibiotic and re-evaluate.  I will obtain recent chest x-ray from [facility] on disk. We will repeat chest x-ray here in 2 weeks.   I will request to radonc notes to see specific radiation fields.   Patient will contact if he is not referred to [facility].   Port placement in with Dr. Arnoldo Morale in 2 weeks. He will have a follow up after that.  All questions were answered. The patient knows to call the clinic with any problems, questions or concerns.  This document serves as a record of services personally performed by Ancil Linsey, MD. It was created on her behalf by Kandace Blitz, a trained medical scribe. The creation of this record is based on the scribe's personal observations and the provider's statements to them. This document has been checked and approved by the attending provider.  I have reviewed the above documentation for accuracy and completeness, and I agree with the above.  This note was electronically signed.    Molli Hazard, MD  02/14/2016 8:30 AM

## 2016-02-18 ENCOUNTER — Other Ambulatory Visit (HOSPITAL_COMMUNITY): Payer: Self-pay | Admitting: *Deleted

## 2016-02-18 DIAGNOSIS — C3491 Malignant neoplasm of unspecified part of right bronchus or lung: Secondary | ICD-10-CM

## 2016-02-18 DIAGNOSIS — J441 Chronic obstructive pulmonary disease with (acute) exacerbation: Secondary | ICD-10-CM

## 2016-02-18 MED ORDER — ACETAMINOPHEN 325 MG PO TABS: 650.0000 mg | ORAL_TABLET | Freq: Once | ORAL | Status: DC

## 2016-02-18 MED ORDER — DIPHENHYDRAMINE HCL 25 MG PO CAPS: 25.0000 mg | ORAL_CAPSULE | Freq: Once | ORAL | Status: DC

## 2016-02-18 NOTE — Progress Notes (Signed)
I accidentally entered blood orders on this patient. Patient isn't here. Wrong chart opened. Lab notified to cancel the orders that I placed. Will enter orders on correct patient.

## 2016-02-22 NOTE — H&P (Signed)
  NTS SOAP Note  Vital Signs:  Vitals as of: 07/21/7543: Systolic 920: Diastolic 76: Heart Rate 91: Temp 99.64F (Temporal): Height 67f 5in: Weight 134Lbs 0 Ounces: BMI 22.3   BMI : 22.3 kg/m2  Subjective: This 60year old male presents for of need for portacath insertion.  Is about to undergo chemotherapy for right lung carcinoma.  Just finished radiation therapy.  Review of Symptoms: noncontributory   Past Medical History:  Reviewed  Past Medical History  Surgical History: lung biopsy Medical Problems:  Asthma, CVA, high cholesterol, right lung carcinoma, HTN Allergies: nkda Medications: ventoin, baby asa, lipitor, symbicort, hydrocodone, ativan, zofran, protonix, inderal, xeralto,spiriva   Social History:Reviewed  Social History  Preferred Language: English ([place]) Race:  White Ethnicity: Not Hispanic / Latino Age: [age]Years 7 Months Marital Status:  M Alcohol: occassional Recreational drug(s):  No   Smoking Status: Current every day smoker reviewed on 02/21/2016 Started Date: 12/15/1976 Packs per day: 1.00 Functional Status reviewed on 02/21/2016 ------------------------------------------------ Bathing: Normal Cooking: Normal Dressing: Normal Driving: Normal Eating: Normal Managing Meds: Normal Oral Care: Normal Shopping: Normal Toileting: Normal Transferring: Normal Walking: Disablilty - uses cane Cognitive Status reviewed on 02/21/2016 ------------------------------------------------ Attention: Normal Decision Making: Normal Language: Normal Memory: Normal Motor: Normal Perception: Normal Problem Solving: Normal Visual and Spatial: Normal   Family History:Reviewed  Family Health History Family History is Unknown    Objective Information: General:Well appearing, well nourished in no distress. Heart:RRR, no murmur Minimal expiratory wheezing noted bilaterally.  Assessment:Right lung carcinoma, need for central venous  access  Diagnoses: 162.9  C34.91 Primary malignant neoplasm of right lung (Malignant neoplasm of unspecified part of right bronchus or lung)  Procedures: 910071- OFFICE OUTPATIENT NEW 20 MINUTES    Plan:  Scheduled for portacath insertion on 02/27/16.  Will hold xeralto two days prior to procedure.   Patient Education:Alternative treatments to surgery were discussed with patient (and family).  Risks and benefits  of procedure including bleeding, infection, and pneumothroax were fully explained to the patient (and family) who gave informed consent. Patient/family questions were addressed.  Follow-up:Pending Surgery

## 2016-02-25 ENCOUNTER — Encounter (HOSPITAL_COMMUNITY): Payer: Self-pay

## 2016-02-25 ENCOUNTER — Encounter (HOSPITAL_COMMUNITY)
Admission: RE | Admit: 2016-02-25 | Discharge: 2016-02-25 | Disposition: A | Discharge status: Home / Self Care | Payer: Medicare PPO | Source: Ambulatory Visit | Attending: General Surgery | Admitting: General Surgery

## 2016-02-26 ENCOUNTER — Encounter (HOSPITAL_COMMUNITY): Payer: Self-pay | Admitting: Hematology & Oncology

## 2016-02-26 ENCOUNTER — Encounter (HOSPITAL_BASED_OUTPATIENT_CLINIC_OR_DEPARTMENT_OTHER): Payer: Medicare PPO | Admitting: Hematology & Oncology

## 2016-02-26 VITALS — BP 121/75 | HR 98 | Temp 98.5°F | Resp 18 | Wt 134.8 lb

## 2016-02-26 DIAGNOSIS — C3411 Malignant neoplasm of upper lobe, right bronchus or lung: Secondary | ICD-10-CM | POA: Diagnosis not present

## 2016-02-26 DIAGNOSIS — I480 Paroxysmal atrial fibrillation: Secondary | ICD-10-CM | POA: Diagnosis not present

## 2016-02-26 DIAGNOSIS — J449 Chronic obstructive pulmonary disease, unspecified: Secondary | ICD-10-CM

## 2016-02-26 DIAGNOSIS — C3491 Malignant neoplasm of unspecified part of right bronchus or lung: Secondary | ICD-10-CM

## 2016-02-26 DIAGNOSIS — R058 Other specified cough: Secondary | ICD-10-CM

## 2016-02-26 DIAGNOSIS — R05 Cough: Secondary | ICD-10-CM

## 2016-02-26 DIAGNOSIS — D638 Anemia in other chronic diseases classified elsewhere: Secondary | ICD-10-CM

## 2016-02-26 DIAGNOSIS — J9600 Acute respiratory failure, unspecified whether with hypoxia or hypercapnia: Secondary | ICD-10-CM

## 2016-02-26 MED ORDER — ASPIRIN 325 MG PO TABS: 325.0000 mg | ORAL_TABLET | Freq: Every day | ORAL | Status: DC

## 2016-02-26 MED ORDER — PANTOPRAZOLE SODIUM 40 MG PO TBEC: 40.0000 mg | DELAYED_RELEASE_TABLET | Freq: Every day | ORAL | Status: DC

## 2016-02-26 MED ORDER — RANITIDINE HCL 150 MG PO TABS: 150.0000 mg | ORAL_TABLET | Freq: Two times a day (BID) | ORAL | Status: DC

## 2016-02-26 MED ORDER — RIVAROXABAN 20 MG PO TABS: 20.0000 mg | ORAL_TABLET | Freq: Every day | ORAL | Status: AC

## 2016-02-26 NOTE — Progress Notes (Signed)
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 male is here because of clinically staged III (T1N2M0) Squamous cell carcinoma of the R lung.  He was admitted to [facility] after his bronchoscopy at [facility]:  History of present illness: patient was admitted by Tyler Aguilar for SOB on Jan 19, 17. As per his H and P: " Tyler Aguilar is a 60 y.o. male with history of COPD and CVA, abdominal artery aneurysm ongoing tobacco abuse who has had bronchoscopy for mediastinal lymphadenopathy 2 days ago started experiencing shortness of breath last afternoon. Patient initially called EMS and was provided oxygen following which patient's shortness of breath improved. Patient's shortness of breath recurred later in the evening and patient's family provided some nebulizer following which patient's shortness of breath improved. Patient had contacted his pulmonologist and was instructed to come to the nearest ER. CT angiogram of the chest and was negative for PE but does show mucus plugging and possible airspace disease. Patient also was wheezing and was provided nebulizer treatment. Patient has been admitted for further management of his respiratory distress which could be from COPD. Patient's  troponin was mildly elevated but patient denies any chest pain. EKG shows sinus rhythm with right bundle branch block. On-call cardiologist was consulted by ER physician. At this time to have recommended to cycle cardiac markers.    Hospital Course: Tyler Aguilar is an 60 y.o. male with hx of recent mediastinal mass, with long standing hx of tobacco use, AAA, COPD, prior CVA, recently had biopsy at [facility] with result pending, admitted for SOB with intermittent stridor, seems to be worse with anxiety, Tx with oral prednisone, antibiotics, and nebs. CTPA showed no PE, but large mass is seen compressing moderately on main stem bronchi, but they remain patent. I noted that he has intermittent stridor and SOB, but when he is more relaxed, he breathed quite normally. With this, he was started on RTC Ativan, and was given Hycodan cough syrup. He was given IV steroids, and his neb was changed to racemic epinephrines. 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 With this treatments, he improved, and was stable. He was therefore discharged home to have followed up with his pulmonologist at [facility]. During his stayed, he was also seen by Tyler Tyler Aguilar of pulmonary, who agreed with our managements. He did have a slight elevation of his troponin to 0.04, and had cardiology saw him, recommending no further cardiac work up. His ECHO was normal. He will be discharged on Levaquin, along with rapid steroid taper, Ativan RTC, and antitussive RTC. He will resume his care at [facility]. Thank you for allowing me to participate in his care.   Tyler Aguilar returns to the [facility] today accompanied by his sister. His weight is up today and he notes that he is doing well. Both he  and his sister remark that he doesn't look like the same person that came into the hospital initially.  He says his breathing feels good, and that he can breathe; "not being sarcastic or smart, I can breathe." He says he's still  coughing up congestion, but that sometimes it's clear, and sometimes it's not quite green, but has a faint discolor to it. His sister notes that he still has some antibiotics to complete.  During the physical exam, he notes that his appetite is good, "at least I think so." He confirms eating a variety of foods and having a taste for foods.  Strength-wise with therapy, Tyler Aguilar says he walked all the way from the front door into the [place] today, and that he isn't using his cane. His sister notes that he's "not touching it." Tyler Aguilar comments that they went out last night and she said "where's your cane?" but he realized he'd left it at home, and then didn't even go back to get it because he felt that would be "more trouble" than it was worth.  Tyler Aguilar notes that he finished up radiation last Tuesday. He will be receiving surgery with Tyler. Arnoldo Aguilar soon, scheduled in the morning. (port a cath placement)  He remarks that he takes Ativan at night to help him sleep.  His sister says that they want the slam dunk when it comes to treatment, in spite of the fact that it may affect his hair. She notes that, after chemo, her hair came back thicker and with better texture than it was before, possibly trying to encourage Tyler Aguilar to accept whatever treatment options are presented. He notes he has a hard time accepting that he may loose his hair.  During his appiontment today, we discussed the fact that he's done so well mostly due to his positive attitude and determination. He was advised that, physically, he is fit enough to start chemo treatment. As long as he keeps his weight up and continues to do well, we can get started his chemo as soon as possible.  MEDICAL HISTORY:  Past Medical History  Diagnosis Date  . Stroke [facility])     May 02, 2008  . COPD (chronic obstructive pulmonary disease) ([place])   . AAA (abdominal aortic aneurysm) ([place])   . Dyslipidemia   . Mediastinal mass     SURGICAL  HISTORY: Past Surgical History  Procedure Laterality Date  . Finger surgery  1979    right side  . Colonoscopy  08/31/2012    Procedure: COLONOSCOPY;  Surgeon: Jamesetta So, MD;  Location: [facility] ENDO SUITE;  Service: Gastroenterology;  Laterality: N/A;  . Bronchoscopy      SOCIAL HISTORY: Social History   Social History  . Marital Status: Married    Spouse Name: N/A  . Number of Children: N/A  . Years of Education: N/A   Occupational History  . Not on file.   Social History Main Topics  . Smoking status: Current Every Day Smoker  . Smokeless tobacco: Not on file  . Alcohol Use: Yes  . Drug Use: No  . Sexual Activity: Not on file   Other Topics Concern  . Not on file   Social History Narrative  Currently going through a divorce 0 children Ex smoker. Previously worked as a Dealer.  FAMILY HISTORY: Family History  Problem Relation Age of Onset  . Diabetes Mellitus II Mother   . Cancer Mother   . Cancer Father   . Coronary  artery disease Brother     CABG   has no family status information on file.   Father died of a hematoma of the liver at 80 yo Mother died of heart failure at 22 yo  ALLERGIES:  has No Known Allergies.  MEDICATIONS:  Current Outpatient Prescriptions  Medication Sig Dispense Refill  . albuterol (PROVENTIL HFA;VENTOLIN HFA) 108 (90 Base) MCG/ACT inhaler Inhale 2 puffs into the lungs every 4 (four) hours as needed for wheezing or shortness of breath.    Marland Kitchen aspirin 325 MG tablet Take 1 tablet (325 mg total) by mouth daily. 90 tablet 2  . atorvastatin (LIPITOR) 80 MG tablet Take 80 mg by mouth every evening.    . baclofen (LIORESAL) 10 MG tablet Take 10 mg by mouth.    . budesonide-formoterol (SYMBICORT) 160-4.5 MCG/ACT inhaler Inhale 2 puffs into the lungs 2 (two) times daily.    Marland Kitchen dexamethasone (DECADRON) 2 MG tablet     . HYDROcodone-homatropine (HYCODAN) 5-1.5 MG/5ML syrup Take 5 mLs by mouth 4 (four) times daily. 120 mL 0  . levofloxacin  (LEVAQUIN) 500 MG tablet Take 1 tablet (500 mg total) by mouth daily. 14 tablet 0  . LORazepam (ATIVAN) 1 MG tablet Take 1 tablet (1 mg total) by mouth 3 (three) times daily. 30 tablet 0  . magic mouthwash w/lidocaine SOLN Take 30 mLs by mouth.    . ondansetron (ZOFRAN) 8 MG tablet Take 8 mg by mouth.    . oxyCODONE (ROXICODONE) 5 MG immediate release tablet Take 5 mg by mouth.    . pantoprazole (PROTONIX) 40 MG tablet Take 1 tablet (40 mg total) by mouth daily. 90 tablet 2  . predniSONE (STERAPRED UNI-PAK 21 TAB) 10 MG (21) TBPK tablet Use as directed on Package. 21 tablet 0  . propranolol (INDERAL) 20 MG tablet Take 20 mg by mouth 3 (three) times daily.    Marland Kitchen pyridOXINE (VITAMIN B-6) 50 MG tablet Take 50 mg by mouth daily.    . ranitidine (ZANTAC) 150 MG tablet Take 1 tablet (150 mg total) by mouth 2 (two) times daily. 180 tablet 2  . rivaroxaban (XARELTO) 20 MG TABS tablet Take 1 tablet (20 mg total) by mouth daily. 90 tablet 2  . sodium chloride (OCEAN) 0.65 % nasal spray Place into the nose.    . sucralfate (CARAFATE) 1 GM/10ML suspension Take 1 g by mouth 4 (four) times daily -  before meals and at bedtime.    Marland Kitchen tiotropium (SPIRIVA) 18 MCG inhalation capsule Place 18 mcg into inhaler and inhale every evening.     No current facility-administered medications for this visit.    Review of Systems  Constitutional: Negative for weight loss.  Doing much better overall. HENT: Positive for congestion.   Eyes: Negative.   Respiratory: Positive for cough, sputum production, improved   Cardiovascular: Negative.   Gastrointestinal: Negative.  Negative for diarrhea.  Genitourinary: Negative.   Musculoskeletal: Negative for falls.      History of falls secondary to leg weakness.  Skin: Negative.   Neurological: Positive for tremors and weakness.       Tremor managed with propranolol. Left leg weakness.  Endo/Heme/Allergies: Negative.   Psychiatric/Behavioral: Currently denies All other  systems reviewed and are negative.  14 point ROS was done and is otherwise as detailed above or in HPI   PHYSICAL EXAMINATION: ECOG PERFORMANCE STATUS: 2 - Symptomatic, <50% confined to bed  Filed Vitals:   02/26/16 0823  BP: 121/75  Pulse: 98  Temp: 98.5 F (36.9 C)  Resp: 18   Filed Weights   02/26/16 0823  Weight: 134 lb 12.8 oz (61.145 kg)    Physical Exam  Constitutional: He is oriented to person, place, and time and well-developed, well-nourished, and in no distress. Well groomed Ambulated to the clinic today, without a cane. HENT:  Head: Normocephalic and atraumatic.  Nose: Nose normal.  Mouth/Throat: Oropharynx is clear and moist. No oropharyngeal exudate.  Eyes: Conjunctivae and EOM are normal. Pupils are equal, round, and reactive to light. Right eye exhibits no discharge. Left eye exhibits no discharge. No scleral icterus.  Neck: Normal range of motion. Neck supple. No tracheal deviation present. No thyromegaly present.  Cardiovascular: Normal rate, regular rhythm and normal heart sounds.  Exam reveals no gallop and no friction rub.   No murmur heard. Pulmonary/Chest: Effort normal. He has no wheezes. He has no rales.  Clear with good breath sounds.  Abdominal: Soft. Bowel sounds are normal. He exhibits no distension and no mass. There is no tenderness. There is no rebound and no guarding.  Musculoskeletal: Normal range of motion. He exhibits no edema.  Lymphadenopathy:    He has no cervical adenopathy.  Neurological: He is alert and oriented to person, place, and time. No cranial nerve deficit.  Skin: Skin is warm and dry. No rash noted.  Psychiatric: Mood, memory, affect and judgment normal.  Nursing note and vitals reviewed.  LABORATORY DATA:  I have reviewed the data as listed  CBC    Component Value Date/Time   WBC 8.9 02/14/2016 0854   RBC 3.61* 02/14/2016 0854   HGB 11.5* 02/14/2016 0854   HCT 35.3* 02/14/2016 0854   PLT 203 02/14/2016 0854    MCV 97.8 02/14/2016 0854   MCH 31.9 02/14/2016 0854   MCHC 32.6 02/14/2016 0854   RDW 16.2* 02/14/2016 0854   LYMPHSABS 0.3* 02/14/2016 0854   MONOABS 0.4 02/14/2016 0854   EOSABS 0.0 02/14/2016 0854   BASOSABS 0.0 02/14/2016 0854   CMP     Component Value Date/Time   NA 135 02/14/2016 0854   K 3.9 02/14/2016 0854   CL 100* 02/14/2016 0854   CO2 28 02/14/2016 0854   GLUCOSE 152* 02/14/2016 0854   BUN 11 02/14/2016 0854   CREATININE 0.49* 02/14/2016 0854   CALCIUM 8.5* 02/14/2016 0854   PROT 6.1* 02/14/2016 0854   ALBUMIN 2.5* 02/14/2016 0854   AST 23 02/14/2016 0854   ALT 37 02/14/2016 0854   ALKPHOS 61 02/14/2016 0854   BILITOT 0.6 02/14/2016 0854   GFRNONAA >60 02/14/2016 0854   GFRAA >60 02/14/2016 0854    RADIOGRAPHIC STUDIES: I have personally reviewed the radiological images as listed and agreed with the findings in the report.  Result Impression   1.  Interval increase in interstitial and airspace opacities in the left lung base concerning for pneumonitis/pneumonia.  2. Subcarinal and hilar lymphadenopathy.   Result Narrative  XR CHEST PA AND LATERAL, 02/13/2016 12:42 PM  INDICATION: cough/[id number] Cough  COMPARISON: Multiple priors, including most recent chest radiograph dated 01/20/2016  FINDINGS:   Cardiovascular: Similar cardiopericardial silhouette. Tortuous and atherosclerotic thoracic aorta.     Mediastinum: Subcarinal and hilar lymphadenopathy.     Lungs/pleura: Interval increase in interstitial and airspace opacities in the left lung base.     Upper abdomen: Visualized portions are unremarkable.  Chest wall/osseous structures: Unremarkable.     Study Result     CLINICAL DATA: Sudden onset intermittent worsening shortness of breath  beginning this afternoon. Status post bronchoscopy yesterday. History of COPD and stroke.  EXAM: CT ANGIOGRAPHY CHEST WITH CONTRAST  TECHNIQUE: Multidetector CT imaging of the chest was performed using  the standard protocol during bolus administration of intravenous contrast. Multiplanar CT image reconstructions and MIPs were obtained to evaluate the vascular anatomy.  CONTRAST: 139m OMNIPAQUE IOHEXOL 350 MG/ML SOLN  COMPARISON: Chest radiograph January 02, 2016  FINDINGS: PULMONARY ARTERY: Inadequate pulmonary arterial contrast opacification, 140 Hounsfield units, target is 250 Hounsfield units. Main pulmonary artery is not enlarged. No central pulmonary arterial filling defects.  MEDIASTINUM: Hypo enhancing 4.6 x 4.7 x 7.3 cm (AP by transverse by CC) sub carinal mass, moderately narrowing the bilateral mainstem bronchi which remains patent. Soft tissue effaces multiple lower lobe segmental bronchi, however patent subsegmental bronchi. Heart size is normal. Moderate coronary artery calcifications. Moderate calcific atherosclerosis of the aortic arch.  LUNGS: Tracheobronchial tree is patent, no pneumothorax. Moderate centrilobular emphysema. Small faint ground-glass opacity RIGHT upper lobe. No pleural effusions, focal consolidations, parenchymal masses.  SOFT TISSUES AND OSSEOUS STRUCTURES: Included view of the abdomen is unremarkable. Visualized soft tissues and included osseous structures appear normal.  Review of the MIP images confirms the above findings.  IMPRESSION: Suboptimal examination without central pulmonary embolism, on this delayed phase examination.  4.6 x 4.7 x 7.3 cm sub carinal/mediastinal mass moderately narrowing the bilateral mainstem bronchi which remains patent. Differential diagnosis includes infectious or inflammatory lymphadenopathy, lymphoma, metastasis. Mucoid impaction within multiple lower lobe segmental bronchi.  Faint RIGHT upper lobe ground-glass opacity may be infectious or inflammatory. Moderate centrilobular emphysema.   Electronically Signed  By: CElon AlasM.D.  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 We discussed potential chemotherapy regimens. He would like to do treatment here and we are certainly glad to accommodate him locally.   Today we need him to sign a release so we can get his radiation notes.   [facility] until March 29th. I will try to fit him in downstairs sooner than that, with [place] or MDomenic Polite so we can obtain a cardiovascular evaluation for chemotherapy. He is also interested in staying local if at all possible as traveling back and forth is becoming difficult.  I will discuss his options for chemo with HHildred Alamin and she will see him for teaching.  He notes that though he doesn't need it right now, he will need his Ativan refilled soon.  He looks excellent. Weight is up, he is walking independently. Cough is improved. He is certainly a good candidate for chemotherapy. We will see him back next week.   All questions were answered. The patient knows to call the clinic with any problems, questions or concerns.  This document serves as a record of services personally performed by SAncil Linsey MD. It was created on her behalf by KToni Amend a trained medical scribe.  The creation of this record is based on the scribe's personal observations and the provider's statements to them. This  document has been checked and approved by the attending provider.  I have reviewed the above documentation for accuracy and completeness, and I agree with the above.  This note was electronically signed.    Molli Hazard, MD  02/26/2016 9:11 AM

## 2016-02-26 NOTE — Patient Instructions (Addendum)
[place] at [facility] Discharge Instructions  RECOMMENDATIONS MADE BY THE CONSULTANT AND ANY TEST RESULTS WILL BE SENT TO YOUR REFERRING PHYSICIAN.   Exam and discussion by Dr Whitney Muse today You gained 4.8 pounds since the last time you were here. Refills on Xarelto, aspirin, protonix, and Zantac  Referral to cardiology here at [facility], they will call you with this appt Hopefully we can get you started on chemotherapy next week  Sign a release so we can get your radiation notes from [facility]  Return to see the doctor after haley meets with you to do teaching and will give you a calender Please call the clinic if you have any questions or concerns    Thank you for choosing [place] at [facility] to provide your oncology and hematology care.  To afford each patient quality time with our provider, please arrive at least 15 minutes before your scheduled appointment time.   Beginning January 23rd 2017 lab work for the [facility] will be done in the  Main lab at [facility] on 1st floor. If you have a lab appointment with the [facility] please come in thru the  Main Entrance and check in at the main information desk  You need to re-schedule your appointment should you arrive 10 or more minutes late.  We strive to give you quality time with our providers, and arriving late affects you and other patients whose appointments are after yours.  Also, if you no show three or more times for appointments you may be dismissed from the clinic at the providers discretion.     Again, thank you for choosing [facility].  Our hope is that these requests will decrease the amount of time that you wait before being seen by our physicians.       _____________________________________________________________  Should you have questions after your visit to [facility], please contact our office at [phone] between the hours of  8:30 a.m. and 4:30 p.m.  Voicemails left after 4:30 p.m. will not be returned until the following business day.  For prescription refill requests, have your pharmacy contact our office.         Resources For Cancer Patients and their Caregivers ? American Cancer Society: Can assist with transportation, wigs, general needs, runs Look Good Feel Better.        (864) 288-1799 ? Cancer Care: Provides financial assistance, online support groups, medication/co-pay assistance.  1-800-813-HOPE 775 058 7733) ? [place] Assists [place] cancer patients and their families through emotional , educational and financial support.  (518)645-9950 ? [place] DSS Where to apply for food stamps, Medicaid and utility assistance. 9405368671 ? [facility]: Transportation to medical appointments. 4500564131 ? Social Security Administration: May apply for disability if have a Stage IV cancer. 5405343886 910-087-5384 ? [employer], Disability and Transit Services: Assists with nutrition, care and transit needs. 782-016-1588

## 2016-02-27 ENCOUNTER — Encounter (HOSPITAL_COMMUNITY): Payer: Self-pay | Admitting: *Deleted

## 2016-02-27 ENCOUNTER — Ambulatory Visit (HOSPITAL_COMMUNITY): Payer: Medicare PPO

## 2016-02-27 ENCOUNTER — Encounter (HOSPITAL_COMMUNITY)
Admission: RE | Disposition: A | Discharge status: Home / Self Care | Payer: Self-pay | Source: Ambulatory Visit | Attending: General Surgery

## 2016-02-27 ENCOUNTER — Ambulatory Visit (HOSPITAL_COMMUNITY): Payer: Medicare PPO | Admitting: Anesthesiology

## 2016-02-27 ENCOUNTER — Ambulatory Visit (HOSPITAL_COMMUNITY)
Admission: RE | Admit: 2016-02-27 | Discharge: 2016-02-27 | Disposition: A | Discharge status: Home / Self Care | Payer: Medicare PPO | Source: Ambulatory Visit | Attending: General Surgery | Admitting: General Surgery

## 2016-02-27 DIAGNOSIS — Z95828 Presence of other vascular implants and grafts: Secondary | ICD-10-CM

## 2016-02-27 DIAGNOSIS — K219 Gastro-esophageal reflux disease without esophagitis: Secondary | ICD-10-CM | POA: Insufficient documentation

## 2016-02-27 DIAGNOSIS — F17200 Nicotine dependence, unspecified, uncomplicated: Secondary | ICD-10-CM | POA: Insufficient documentation

## 2016-02-27 DIAGNOSIS — Z923 Personal history of irradiation: Secondary | ICD-10-CM | POA: Insufficient documentation

## 2016-02-27 DIAGNOSIS — Z7901 Long term (current) use of anticoagulants: Secondary | ICD-10-CM | POA: Diagnosis not present

## 2016-02-27 DIAGNOSIS — J449 Chronic obstructive pulmonary disease, unspecified: Secondary | ICD-10-CM | POA: Insufficient documentation

## 2016-02-27 DIAGNOSIS — Z791 Long term (current) use of non-steroidal anti-inflammatories (NSAID): Secondary | ICD-10-CM | POA: Diagnosis not present

## 2016-02-27 DIAGNOSIS — I6990 Unspecified sequelae of unspecified cerebrovascular disease: Secondary | ICD-10-CM | POA: Insufficient documentation

## 2016-02-27 DIAGNOSIS — E7800 Pure hypercholesterolemia, unspecified: Secondary | ICD-10-CM | POA: Diagnosis not present

## 2016-02-27 DIAGNOSIS — C3491 Malignant neoplasm of unspecified part of right bronchus or lung: Secondary | ICD-10-CM | POA: Insufficient documentation

## 2016-02-27 DIAGNOSIS — Z7982 Long term (current) use of aspirin: Secondary | ICD-10-CM | POA: Diagnosis not present

## 2016-02-27 DIAGNOSIS — Z7951 Long term (current) use of inhaled steroids: Secondary | ICD-10-CM | POA: Diagnosis not present

## 2016-02-27 HISTORY — PX: PORTACATH PLACEMENT: SHX2246

## 2016-02-27 SURGERY — INSERTION, TUNNELED CENTRAL VENOUS DEVICE, WITH PORT
Anesthesia: Monitor Anesthesia Care | Site: Chest | Laterality: Left

## 2016-02-27 MED ORDER — ONDANSETRON HCL 4 MG/2ML IJ SOLN: 4.0000 mg | Freq: Once | INTRAMUSCULAR | Status: DC | PRN

## 2016-02-27 MED ORDER — MIDAZOLAM HCL 2 MG/2ML IJ SOLN
1.0000 mg | INTRAMUSCULAR | Status: DC | PRN
  Administered 2016-02-27: 2 mg via INTRAVENOUS

## 2016-02-27 MED ORDER — FENTANYL CITRATE (PF) 100 MCG/2ML IJ SOLN: 25.0000 ug | INTRAMUSCULAR | Status: DC | PRN

## 2016-02-27 MED ORDER — LACTATED RINGERS IV SOLN
INTRAVENOUS | Status: DC
  Administered 2016-02-27: 07:00:00 via INTRAVENOUS

## 2016-02-27 MED ORDER — LIDOCAINE HCL (PF) 1 % IJ SOLN
INTRAMUSCULAR | Status: AC
  Filled 2016-02-27: qty 30

## 2016-02-27 MED ORDER — MIDAZOLAM HCL 2 MG/2ML IJ SOLN
INTRAMUSCULAR | Status: AC
  Filled 2016-02-27: qty 2

## 2016-02-27 MED ORDER — KETOROLAC TROMETHAMINE 30 MG/ML IJ SOLN
INTRAMUSCULAR | Status: AC
  Filled 2016-02-27: qty 1

## 2016-02-27 MED ORDER — PROPOFOL 10 MG/ML IV BOLUS
INTRAVENOUS | Status: AC
  Filled 2016-02-27: qty 20

## 2016-02-27 MED ORDER — PROPOFOL 500 MG/50ML IV EMUL
INTRAVENOUS | Status: DC | PRN
  Administered 2016-02-27: 40 ug/kg/min via INTRAVENOUS

## 2016-02-27 MED ORDER — IPRATROPIUM-ALBUTEROL 0.5-2.5 (3) MG/3ML IN SOLN: 3.0000 mL | Freq: Four times a day (QID) | RESPIRATORY_TRACT | Status: DC

## 2016-02-27 MED ORDER — GLYCOPYRROLATE 0.2 MG/ML IJ SOLN
INTRAMUSCULAR | Status: AC
  Filled 2016-02-27: qty 1

## 2016-02-27 MED ORDER — IPRATROPIUM-ALBUTEROL 0.5-2.5 (3) MG/3ML IN SOLN: 3.0000 mL | Freq: Once | RESPIRATORY_TRACT | Status: DC

## 2016-02-27 MED ORDER — IPRATROPIUM-ALBUTEROL 0.5-2.5 (3) MG/3ML IN SOLN
3.0000 mL | Freq: Once | RESPIRATORY_TRACT | Status: AC
  Administered 2016-02-27: 3 mL via RESPIRATORY_TRACT

## 2016-02-27 MED ORDER — ONDANSETRON HCL 4 MG/2ML IJ SOLN
INTRAMUSCULAR | Status: AC
  Filled 2016-02-27: qty 2

## 2016-02-27 MED ORDER — KETOROLAC TROMETHAMINE 30 MG/ML IJ SOLN
30.0000 mg | Freq: Once | INTRAMUSCULAR | Status: AC
  Administered 2016-02-27: 30 mg via INTRAVENOUS

## 2016-02-27 MED ORDER — ALBUTEROL SULFATE (2.5 MG/3ML) 0.083% IN NEBU
INHALATION_SOLUTION | RESPIRATORY_TRACT | Status: AC
  Filled 2016-02-27: qty 3

## 2016-02-27 MED ORDER — LIDOCAINE HCL (PF) 1 % IJ SOLN
INTRAMUSCULAR | Status: DC | PRN
  Administered 2016-02-27: 7 mL

## 2016-02-27 MED ORDER — CEFAZOLIN SODIUM-DEXTROSE 2-3 GM-% IV SOLR
2.0000 g | INTRAVENOUS | Status: AC
  Administered 2016-02-27: 2 g via INTRAVENOUS

## 2016-02-27 MED ORDER — FENTANYL CITRATE (PF) 100 MCG/2ML IJ SOLN
INTRAMUSCULAR | Status: AC
  Filled 2016-02-27: qty 2

## 2016-02-27 MED ORDER — CEFAZOLIN SODIUM-DEXTROSE 2-3 GM-% IV SOLR
INTRAVENOUS | Status: AC
  Filled 2016-02-27: qty 50

## 2016-02-27 MED ORDER — ONDANSETRON HCL 4 MG/2ML IJ SOLN
4.0000 mg | Freq: Once | INTRAMUSCULAR | Status: AC
  Administered 2016-02-27: 4 mg via INTRAVENOUS

## 2016-02-27 MED ORDER — CHLORHEXIDINE GLUCONATE 4 % EX LIQD: 1.0000 "application " | Freq: Once | CUTANEOUS | Status: DC

## 2016-02-27 MED ORDER — FENTANYL CITRATE (PF) 100 MCG/2ML IJ SOLN
25.0000 ug | INTRAMUSCULAR | Status: AC
  Administered 2016-02-27: 25 ug via INTRAVENOUS

## 2016-02-27 MED ORDER — IPRATROPIUM-ALBUTEROL 0.5-2.5 (3) MG/3ML IN SOLN
RESPIRATORY_TRACT | Status: AC
  Filled 2016-02-27: qty 3

## 2016-02-27 MED ORDER — GLYCOPYRROLATE 0.2 MG/ML IJ SOLN
0.2000 mg | Freq: Once | INTRAMUSCULAR | Status: AC
  Administered 2016-02-27: 0.2 mg via INTRAVENOUS

## 2016-02-27 MED ORDER — MIDAZOLAM HCL 5 MG/5ML IJ SOLN
INTRAMUSCULAR | Status: DC | PRN
  Administered 2016-02-27: 1 mg via INTRAVENOUS

## 2016-02-27 MED ORDER — FENTANYL CITRATE (PF) 100 MCG/2ML IJ SOLN
INTRAMUSCULAR | Status: DC | PRN
  Administered 2016-02-27: 25 ug via INTRAVENOUS

## 2016-02-27 MED ORDER — HEPARIN SOD (PORK) LOCK FLUSH 100 UNIT/ML IV SOLN
INTRAVENOUS | Status: AC
  Filled 2016-02-27: qty 5

## 2016-02-27 MED ORDER — HEPARIN SOD (PORK) LOCK FLUSH 100 UNIT/ML IV SOLN
INTRAVENOUS | Status: DC | PRN
  Administered 2016-02-27: 500 [IU]

## 2016-02-27 MED ORDER — PROPOFOL 500 MG/50ML IV EMUL: INTRAVENOUS | Status: DC | PRN

## 2016-02-27 SURGICAL SUPPLY — 30 items
BAG DECANTER FOR FLEXI CONT (MISCELLANEOUS) ×3 IMPLANT
BAG HAMPER (MISCELLANEOUS) ×3 IMPLANT
CHLORAPREP W/TINT 10.5 ML (MISCELLANEOUS) ×3 IMPLANT
CLOTH BEACON ORANGE TIMEOUT ST (SAFETY) ×3 IMPLANT
COVER LIGHT HANDLE STERIS (MISCELLANEOUS) ×6 IMPLANT
DECANTER SPIKE VIAL GLASS SM (MISCELLANEOUS) ×3 IMPLANT
DERMABOND ADVANCED (GAUZE/BANDAGES/DRESSINGS) ×2
DERMABOND ADVANCED .7 DNX12 (GAUZE/BANDAGES/DRESSINGS) ×1 IMPLANT
DRAPE C-ARM FOLDED MOBILE STRL (DRAPES) ×3 IMPLANT
ELECT REM PT RETURN 9FT ADLT (ELECTROSURGICAL) ×3
ELECTRODE REM PT RTRN 9FT ADLT (ELECTROSURGICAL) ×1 IMPLANT
GLOVE BIOGEL PI IND STRL 7.0 (GLOVE) ×2 IMPLANT
GLOVE BIOGEL PI INDICATOR 7.0 (GLOVE) ×4
GLOVE ECLIPSE 7.0 STRL STRAW (GLOVE) ×3 IMPLANT
GLOVE SURG SS PI 7.5 STRL IVOR (GLOVE) ×3 IMPLANT
GOWN STRL REUS W/TWL LRG LVL3 (GOWN DISPOSABLE) ×6 IMPLANT
IV NS 500ML (IV SOLUTION) ×2
IV NS 500ML BAXH (IV SOLUTION) ×1 IMPLANT
KIT PORT POWER 8FR ISP MRI (Port) ×3 IMPLANT
KIT ROOM TURNOVER APOR (KITS) ×3 IMPLANT
MANIFOLD NEPTUNE II (INSTRUMENTS) ×3 IMPLANT
NEEDLE HYPO 25X1 1.5 SAFETY (NEEDLE) ×3 IMPLANT
PACK MINOR (CUSTOM PROCEDURE TRAY) ×3 IMPLANT
PAD ARMBOARD 7.5X6 YLW CONV (MISCELLANEOUS) ×3 IMPLANT
SET BASIN LINEN APH (SET/KITS/TRAYS/PACK) ×3 IMPLANT
SUT VIC AB 3-0 SH 27 (SUTURE) ×2
SUT VIC AB 3-0 SH 27X BRD (SUTURE) ×1 IMPLANT
SUT VIC AB 4-0 PS2 27 (SUTURE) ×3 IMPLANT
SYR 20CC LL (SYRINGE) ×3 IMPLANT
SYR CONTROL 10ML LL (SYRINGE) ×3 IMPLANT

## 2016-02-27 NOTE — Discharge Instructions (Signed)
Implanted Port Insertion, Care After °Refer to this sheet in the next few weeks. These instructions provide you with information on caring for yourself after your procedure. Your health care provider may also give you more specific instructions. Your treatment has been planned according to current medical practices, but problems sometimes occur. Call your health care provider if you have any problems or questions after your procedure. °WHAT TO EXPECT AFTER THE PROCEDURE °After your procedure, it is typical to have the following:  °· Discomfort at the port insertion site. Ice packs to the area will help. °· Bruising on the skin over the port. This will subside in 3-4 days. °HOME CARE INSTRUCTIONS °· After your port is placed, you will get a manufacturer's information card. The card has information about your port. Keep this card with you at all times.   °· Know what kind of port you have. There are many types of ports available.   °· Wear a medical alert bracelet in case of an emergency. This can help alert health care workers that you have a port.   °· The port can stay in for as long as your health care provider believes it is necessary.   °· A home health care nurse may give medicines and take care of the port.   °· You or a family member can get special training and directions for giving medicine and taking care of the port at home.   °SEEK MEDICAL CARE IF:  °· Your port does not flush or you are unable to get a blood return.   °· You have a fever or chills. °SEEK IMMEDIATE MEDICAL CARE IF: °· You have new fluid or pus coming from your incision.   °· You notice a bad smell coming from your incision site.   °· You have swelling, pain, or more redness at the incision or port site.   °· You have chest pain or shortness of breath. °  °This information is not intended to replace advice given to you by your health care provider. Make sure you discuss any questions you have with your health care provider. °  °Document  Released: 09/21/2013 Document Revised: 12/06/2013 Document Reviewed: 09/21/2013 °Elsevier Interactive Patient Education ©2016 [employer] °Implanted Port Home Guide °An implanted port is a type of central line that is placed under the skin. Central lines are used to provide IV access when treatment or nutrition needs to be given through a person's veins. Implanted ports are used for long-term IV access. An implanted port may be placed because:  °· You need IV medicine that would be irritating to the small veins in your hands or arms.   °· You need long-term IV medicines, such as antibiotics.   °· You need IV nutrition for a long period.   °· You need frequent blood draws for lab tests.   °· You need dialysis.   °Implanted ports are usually placed in the chest area, but they can also be placed in the upper arm, the abdomen, or the leg. An implanted port has two main parts:  °· Reservoir. The reservoir is round and will appear as a small, raised area under your skin. The reservoir is the part where a needle is inserted to give medicines or draw blood.   °· Catheter. The catheter is a thin, flexible tube that extends from the reservoir. The catheter is placed into a large vein. Medicine that is inserted into the reservoir goes into the catheter and then into the vein.   °HOW WILL I CARE FOR MY INCISION SITE? °Do not get the   incision site wet. Bathe or shower as directed by your health care provider.  °HOW IS MY PORT ACCESSED? °Special steps must be taken to access the port:  °· Before the port is accessed, a numbing cream can be placed on the skin. This helps numb the skin over the port site.   °· Your health care provider uses a sterile technique to access the port. °· Your health care provider must put on a mask and sterile gloves. °· The skin over your port is cleaned carefully with an antiseptic and allowed to dry. °· The port is gently pinched between sterile gloves, and a needle is inserted into the  port. °· Only "non-coring" port needles should be used to access the port. Once the port is accessed, a blood return should be checked. This helps ensure that the port is in the vein and is not clogged.   °· If your port needs to remain accessed for a constant infusion, a clear (transparent) bandage will be placed over the needle site. The bandage and needle will need to be changed every week, or as directed by your health care provider.   °· Keep the bandage covering the needle clean and dry. Do not get it wet. Follow your health care provider's instructions on how to take a shower or bath while the port is accessed.   °· If your port does not need to stay accessed, no bandage is needed over the port.   °WHAT IS FLUSHING? °Flushing helps keep the port from getting clogged. Follow your health care provider's instructions on how and when to flush the port. Ports are usually flushed with saline solution or a medicine called heparin. The need for flushing will depend on how the port is used.  °· If the port is used for intermittent medicines or blood draws, the port will need to be flushed:   °· After medicines have been given.   °· After blood has been drawn.   °· As part of routine maintenance.   °· If a constant infusion is running, the port may not need to be flushed.   °HOW LONG WILL MY PORT STAY IMPLANTED? °The port can stay in for as long as your health care provider thinks it is needed. When it is time for the port to come out, surgery will be done to remove it. The procedure is similar to the one performed when the port was put in.  °WHEN SHOULD I SEEK IMMEDIATE MEDICAL CARE? °When you have an implanted port, you should seek immediate medical care if:  °· You notice a bad smell coming from the incision site.   °· You have swelling, redness, or drainage at the incision site.   °· You have more swelling or pain at the port site or the surrounding area.   °· You have a fever that is not controlled with  medicine. °  °This information is not intended to replace advice given to you by your health care provider. Make sure you discuss any questions you have with your health care provider. °  °Document Released: 12/01/2005 Document Revised: 09/21/2013 Document Reviewed: 08/08/2013 °Elsevier Interactive Patient Education ©2016 Elsevier Inc. ° °

## 2016-02-27 NOTE — Anesthesia Postprocedure Evaluation (Signed)
Anesthesia Post Note  Patient: Tyler Aguilar  Procedure(s) Performed: Procedure(s) (LRB): INSERTION PORT-A-CATH LEFT SUBCLAVIAN (Left)  Patient location during evaluation: PACU Anesthesia Type: MAC Level of consciousness: awake and alert, oriented and patient cooperative Pain management: pain level controlled Vital Signs Assessment: post-procedure vital signs reviewed and stable Respiratory status: spontaneous breathing and patient connected to nasal cannula oxygen Cardiovascular status: blood pressure returned to baseline and stable Postop Assessment: no signs of nausea or vomiting Anesthetic complications: no    Last Vitals:  Filed Vitals:   02/27/16 0704  Temp: 36.9 C    Last Pain: There were no vitals filed for this visit.               Malayna Noori J

## 2016-02-27 NOTE — Op Note (Signed)
Patient:  Tyler Aguilar  DOB:  [birth year]  MRN:  [id number]   Preop Diagnosis:  Right lung carcinoma, need for central venous access  Postop Diagnosis:  Same  Procedure:  Port-A-Cath insertion  Surgeon:  Aviva Signs, M.D.  Anes:  Mac  Indications:  Patient is a 60 year old white male who is about to undergo chemotherapy for right lung carcinoma. The risks and benefits of the procedure including bleeding, infection, and pneumothorax were fully explained to the patient, who gave informed consent.  Procedure note:  The patient was placed in the Trendelenburg position after the left upper chest was prepped and draped using the usual sterile technique with DuraPrep. Surgical site confirmation was performed. 1% Xylocaine was used for local anesthesia.  An incision was made below the left clavicle. Subcutaneous pocket was formed. A needle is advanced into the left subclavian vein using the Seldinger technique without difficulty. A guidewire was then advanced into the right atrium under fluoroscopic guidance. An introducer and peel-away sheath were placed over the guidewire. The catheter was then inserted through the peel-away sheath the peel-away sheath was removed. The catheter was then attached to the port and the port placed in subcutaneous pocket. Adequate positioning was confirmed by fluoroscopy. The port was flushed with heparin flush. Good backflow of blood was noted in the port. The subcutaneous layer was reapproximated using 3-0 Vicryl interrupted suture. The skin was closed using a 4 Vicryl subcuticular suture. Dermabond was then applied.  All tape and needle counts were correct the end of the procedure. Patient was transferred to PACU in stable condition. A chest x-ray will be performed at that time.  Complications:  None  EBL:  Minimal  Specimen:  None

## 2016-02-27 NOTE — Interval H&P Note (Signed)
History and Physical Interval Note:  02/27/2016 7:25 AM  Tyler Aguilar  has presented today for surgery, with the diagnosis of right lung cancer  The various methods of treatment have been discussed with the patient and family. After consideration of risks, benefits and other options for treatment, the patient has consented to  Procedure(s): INSERTION PORT-A-CATH (N/A) as a surgical intervention .  The patient's history has been reviewed, patient examined, no change in status, stable for surgery.  I have reviewed the patient's chart and labs.  Questions were answered to the patient's satisfaction.     Aviva Signs A

## 2016-02-27 NOTE — Transfer of Care (Signed)
Immediate Anesthesia Transfer of Care Note  Patient: Tyler Aguilar  Procedure(s) Performed: Procedure(s): INSERTION PORT-A-CATH LEFT SUBCLAVIAN (Left)  Patient Location: PACU  Anesthesia Type:MAC  Level of Consciousness: awake, oriented and patient cooperative  Airway & Oxygen Therapy: Patient Spontanous Breathing and Patient connected to face mask oxygen  Post-op Assessment: Report given to RN, Post -op Vital signs reviewed and stable and Patient moving all extremities  Post vital signs: Reviewed and stable  Last Vitals:  Filed Vitals:   02/27/16 0704  Temp: 33.3 C    Complications: No apparent anesthesia complications

## 2016-02-27 NOTE — Anesthesia Preprocedure Evaluation (Signed)
Anesthesia Evaluation  Patient identified by MRN, date of birth, ID band Patient awake    Reviewed: Allergy & Precautions, NPO status , Patient's Chart, lab work & pertinent test results  Airway Mallampati: III  TM Distance: >3 FB     Dental  (+) Edentulous Upper, Partial Lower, Poor Dentition   Pulmonary COPD, Current Smoker,  R Lung Cancer Hx resp failure   breath sounds clear to auscultation       Cardiovascular + Peripheral Vascular Disease (AAA)  + dysrhythmias Atrial Fibrillation  Rhythm:Regular Rate:Normal     Neuro/Psych CVA, Residual Symptoms    GI/Hepatic GERD  Medicated,  Endo/Other    Renal/GU      Musculoskeletal   Abdominal   Peds  Hematology   Anesthesia Other Findings   Reproductive/Obstetrics                             Anesthesia Physical Anesthesia Plan  ASA: IV  Anesthesia Plan: MAC   Post-op Pain Management:    Induction: Intravenous  Airway Management Planned: Simple Face Mask  Additional Equipment:   Intra-op Plan:   Post-operative Plan:   Informed Consent: I have reviewed the patients History and Physical, chart, labs and discussed the procedure including the risks, benefits and alternatives for the proposed anesthesia with the patient or authorized representative who has indicated his/her understanding and acceptance.     Plan Discussed with:   Anesthesia Plan Comments:         Anesthesia Quick Evaluation

## 2016-02-28 ENCOUNTER — Ambulatory Visit (INDEPENDENT_AMBULATORY_CARE_PROVIDER_SITE_OTHER): Payer: Medicare PPO | Admitting: Cardiology

## 2016-02-28 ENCOUNTER — Encounter: Payer: Self-pay | Admitting: Cardiology

## 2016-02-28 VITALS — BP 106/66 | HR 107 | Ht 65.0 in | Wt 135.0 lb

## 2016-02-28 DIAGNOSIS — I739 Peripheral vascular disease, unspecified: Secondary | ICD-10-CM

## 2016-02-28 DIAGNOSIS — I429 Cardiomyopathy, unspecified: Secondary | ICD-10-CM

## 2016-02-28 DIAGNOSIS — C3491 Malignant neoplasm of unspecified part of right bronchus or lung: Secondary | ICD-10-CM | POA: Diagnosis not present

## 2016-02-28 DIAGNOSIS — I480 Paroxysmal atrial fibrillation: Secondary | ICD-10-CM

## 2016-02-28 DIAGNOSIS — E785 Hyperlipidemia, unspecified: Secondary | ICD-10-CM

## 2016-02-28 MED ORDER — SODIUM CHLORIDE 0.9 % IV SOLN
INTRAVENOUS | Status: DC | PRN
  Administered 2016-02-27: 500 mL

## 2016-02-28 NOTE — Progress Notes (Signed)
Cardiology Office Note  Date: 02/28/2016   ID: Tyler Aguilar, DOB 1956/01/11, MRN [id number]  PCP: Clinton Quant, MD  Referring provider: Ancil Linsey, MD Primary Cardiologist: Carlyle Dolly, MD  Chief Complaint  Patient presents with  . History of cardiomyopathy  . PAF    History of Present Illness: Tyler Aguilar is a 60 y.o. male patient of Dr. Harl Bowie (seen in hospital consultation recently in January) referred urgently to the office by [name] pending initiation of chemotherapy. This is my first meeting with him. I reviewed extensive records and updated the chart with medical history outlined below. Could not locate discharge summary from [facility] regarding the reported atrial fibrillation and cardiomyopathy documented February, however ECG report and echocardiogram were reviewed. He is here today with his sister and girlfriend.  My understanding is that he has completed radiation treatments at [facility] and is now being considered for initiation of chemotherapy in the near future by [name], details of type are not clear to me. Presumably, agents with potential cardiotoxic side effects are being considered.  He states that he feels much better compared to February, able to breathe more easily. He is not aware of any palpitations, but it sounds like he has had atrial fibrillation documented on at least a few occasions within the last month. He has been on Xarelto and aspirin, two agent regimen crafted by his neurologist at [facility]. Otherwise no cardiac specific medications. His Inderal is for tremors.  At this time he reports NYHA class II dyspnea, no dizziness or syncope.  He also has a history of PAD with 4.5 cm AAA as outlined below, also reported history of carotid artery disease that has not yet required any specific intervention. He has had carotid Dopplers done at [facility], but I could not actually pull up the final report to review.  Past Medical History    Diagnosis Date  . 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 COPD (chronic obstructive pulmonary disease) ([place])   . AAA (abdominal aortic aneurysm) (H[facility])     4.5 cm December 2016  . Dyslipidemia   . Lung cancer ([place])     Stage III squamous cell, right lung  . Cardiomyopathy ([place])     LVEF 35% February 2017 [facility]  . PAF (paroxysmal atrial fibrillation) (H[facility])     Versus SVT documented February 2017 [facility]    Past Surgical History  Procedure Laterality Date  . Finger surgery Right 1979  . Colonoscopy  08/31/2012    Procedure: COLONOSCOPY;  Surgeon: Jamesetta So, MD;  Location: [facility] ENDO SUITE;  Service: Gastroenterology;  Laterality: N/A;  . Bronchoscopy    . Portacath placement Left 02/27/2016    Procedure: INSERTION PORT-A-CATH LEFT SUBCLAVIAN;  Surgeon: Aviva Signs, MD;  Location: [facility] ORS;  Service: General;  Laterality: Left;    No current facility-administered medications for this visit.   No current outpatient prescriptions on file.   Facility-Administered Medications Ordered in Other Visits  Medication Dose Route Frequency Provider Last Rate Last Dose  . chlorhexidine (HIBICLENS) 4 % liquid 1 application  1 application Topical Once Aviva Signs, MD      . fentaNYL (SUBLIMAZE) injection 25-50 mcg  25-50 mcg Intravenous Q5 min PRN Lerry Liner, MD      . lactated ringers infusion   Intravenous Continuous Lerry Liner, MD 75 mL/hr at 02/27/16 0719    . midazolam (  VERSED) injection 1-2 mg  1-2 mg Intravenous Q5 min PRN Lerry Liner, MD   2 mg at 02/27/16 0723  . ondansetron (ZOFRAN) injection 4 mg  4 mg Intravenous Once PRN Lerry Liner, MD       Allergies:  Review of patient's allergies indicates no known allergies.   Social History: The patient  reports that he quit smoking about 8 weeks ago. His smoking use included Cigarettes. He does not have any smokeless tobacco history on file. He reports that he  drinks alcohol. He reports that he does not use illicit drugs.  Family History: The patient's family history includes Cancer in his father and mother; Coronary artery disease in his brother; Diabetes Mellitus II in his mother.   ROS:  Please see the history of present illness. Otherwise, complete review of systems is positive for mild fatigue, NYHA class II dyspnea.  All other systems are reviewed and negative.   Physical Exam: VS:  BP 106/66 mmHg  Pulse 107  Ht '5\' 5"'$  (1.651 m)  Wt 135 lb (61.236 kg)  BMI 22.47 kg/m2  SpO2 90%, BMI Body mass index is 22.47 kg/(m^2).  Wt Readings from Last 3 Encounters:  02/27/16 134 lb (60.782 kg)  02/28/16 135 lb (61.236 kg)  02/26/16 134 lb 12.8 oz (61.145 kg)    General: Patient appears comfortable at rest. HEENT: Conjunctiva and lids normal, oropharynx clear. Neck: Supple, no elevated JVP, soft carotid bruits, no thyromegaly. Lungs: Coarse breath sounds with rhonchi, nonlabored breathing at rest. Cardiac: Regular rate and rhythm, no S3 or significant systolic murmur, no pericardial rub. Abdomen: Soft, nontender, bowel sounds present, no guarding or rebound. Extremities: No pitting edema, distal pulses 2+. Skin: Warm and dry. Musculoskeletal: No kyphosis. Neuropsychiatric: Alert and oriented x3, affect grossly appropriate.  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  Recent Labwork: 02/14/2016: ALT 37; AST 23; BUN 11; Creatinine, Ser 0.49*; Hemoglobin 11.5*; Platelets 203; Potassium 3.9; Sodium 135   Other Studies Reviewed Today:  Echocardiogram 01/22/2016 [facility]): SUMMARY Technically difficult images d/t Atrial fibrillation and patient sitting  straight up in bed with difficulty breathing. The left ventricle is borderline dilated. There is moderate concentric left ventricular hypertrophy. Overall left ventricular  function appears to be moderately reduced . LV ejection fraction = 35%. There is moderate global hypokinesis of the left ventricle. The right ventricle is normal size. The right ventricular systolic function is mildly reduced. There is dysynchrony of the LV and RV. The left atrial size is normal.  Right atrium not well visualized. There is aortic valve sclerosis. There is mild aortic regurgitation. There is no pericardial effusion. Compared to exam images of 05/03/08, LV size has increased and function has  decreased.  Echocardiogram 01/03/2016: Study Conclusions  - Left ventricle: The cavity size was normal. Wall thickness was  increased in a pattern of mild LVH. Systolic function was normal.  The estimated ejection fraction was in the range of 50% to 55%.  Doppler parameters are consistent with abnormal left ventricular  relaxation (grade 1 diastolic dysfunction). Doppler parameters  are consistent with high ventricular filling pressure. - Aortic valve: Mildly calcified annulus. Trileaflet; mildly  thickened leaflets. There was mild regurgitation. Valve area  (VTI): 2.31 cm^2. Valve area (Vmax): 2.16 cm^2. - Aorta: The visualized portion of the proximal ascending aorta is  mildly dilated at 40 mm. Aortic root dimension: 42 mm (ED). -  Aortic root: The aortic root was mildly dilated. - Atrial septum: No defect or patent foramen ovale was identified. - Technically difficult study.  Assessment and Plan:  Medically complex patient with high risk for clinical decompensation, presenting with the following issues to consider:  1. Cardiomyopathy, LVEF reportedly 35% by echocardiogram done at [facility] in February. This was in the setting of acute illness with respiratory failure and rapid atrial fibrillation. I do not have complete details about that hospital stay. LVEF had previously been 50-55% as of January of this year at [facility]. He is not on any cardiac specific medications  at this time and has pending chemotherapy for lung cancer. My recommendation at this time is to repeat an echocardiogram to reassess baseline LVEF now that he has been back in sinus rhythm. Potential medical therapy adjustments can be made from there. Depending on details of chemotherapy regimen and degree of potential cardiac toxicity, he will still remain at risk for cardiac decompensation even if his LVEF has normalized. This does not preclude chemotherapy, but he will need to be followed closely. Visit will be scheduled within the month to see Dr. Harl Bowie and continue ongoing follow-up.  2. Paroxysmal atrial fibrillation. He is on Xarelto for stroke prophylaxis (also aspirin per his neurologist). He will remain at risk for recurring arrhythmia particularly in light of comorbid illness and anticipated chemotherapy with physiologic stressors.  3. PAD including 4.5 cm abdominal aortic aneurysm and carotid artery disease. He is asymptomatic at this time, does have prior history of stroke. He will need to be established with follow-up carotid Dopplers through our practice going forward.  4. 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.  5. History of hyperlipidemia per chart review, not on statin therapy at this time. I do not see recent lipids. This can be rechecked as he establishes for ongoing follow-up.  Current medicines were reviewed with the patient today.   Orders Placed This Encounter  Procedures  . Echocardiogram    Disposition: FU with Dr. Harl Bowie in 1 month.   Signed, Satira Sark, MD, [facility] 02/28/2016 9:00 AM    [place] [facility] [address] [place], [place] [address] Phone: [phone]; Fax: [phone]

## 2016-02-28 NOTE — Patient Instructions (Signed)
Medication Instructions:  Your physician recommends that you continue on your current medications as directed. Please refer to the Current Medication list given to you today.   Labwork: none  Testing/Procedures: Your physician has requested that you have an echocardiogram. Echocardiography is a painless test that uses sound waves to create images of your heart. It provides your doctor with information about the size and shape of your heart and how well your heart's chambers and valves are working. This procedure takes approximately one hour. There are no restrictions for this procedure.    Follow-Up: Your physician recommends that you schedule a follow-up appointment in: 1 month with DR. BRANCH    Any Other Special Instructions Will Be Listed Below (If Applicable). Thanks for choosing [facility]!!!       If you need a refill on your cardiac medications before your next appointment, please call your pharmacy.

## 2016-02-29 ENCOUNTER — Ambulatory Visit (HOSPITAL_COMMUNITY)
Admit: 2016-02-29 | Discharge: 2016-02-29 | Disposition: A | Discharge status: Home / Self Care | Payer: Medicare PPO | Attending: Cardiology | Admitting: Cardiology

## 2016-02-29 ENCOUNTER — Other Ambulatory Visit (HOSPITAL_COMMUNITY): Payer: Self-pay | Admitting: Oncology

## 2016-02-29 DIAGNOSIS — E785 Hyperlipidemia, unspecified: Secondary | ICD-10-CM | POA: Insufficient documentation

## 2016-02-29 DIAGNOSIS — I429 Cardiomyopathy, unspecified: Secondary | ICD-10-CM | POA: Insufficient documentation

## 2016-02-29 DIAGNOSIS — J449 Chronic obstructive pulmonary disease, unspecified: Secondary | ICD-10-CM | POA: Insufficient documentation

## 2016-02-29 DIAGNOSIS — Z720 Tobacco use: Secondary | ICD-10-CM | POA: Diagnosis not present

## 2016-02-29 DIAGNOSIS — Z85118 Personal history of other malignant neoplasm of bronchus and lung: Secondary | ICD-10-CM | POA: Insufficient documentation

## 2016-02-29 DIAGNOSIS — I480 Paroxysmal atrial fibrillation: Secondary | ICD-10-CM | POA: Insufficient documentation

## 2016-03-03 ENCOUNTER — Telehealth: Payer: Self-pay

## 2016-03-03 ENCOUNTER — Other Ambulatory Visit (HOSPITAL_COMMUNITY): Payer: Self-pay | Admitting: Hematology & Oncology

## 2016-03-03 ENCOUNTER — Telehealth (HOSPITAL_COMMUNITY): Payer: Self-pay | Admitting: *Deleted

## 2016-03-03 MED ORDER — CARVEDILOL 3.125 MG PO TABS: 3.1250 mg | ORAL_TABLET | Freq: Two times a day (BID) | ORAL | Status: DC

## 2016-03-03 NOTE — Telephone Encounter (Signed)
pts sister notified that it could be the cracker he ate esp if it was getting better, can your lidocaine swish and swallow.  Notify us if it starts to get worse

## 2016-03-03 NOTE — Telephone Encounter (Signed)
-----   Message from Satira Sark, MD sent at 02/29/2016  5:02 PM EDT ----- Report reviewed. This is a patient of Dr. Harl Bowie that I saw in the office urgently for [name].  LVEF remains reduced at 35-40%, consistent with the more recent evaluation that he had at [facility]. This would place him at fairly high risk in terms of using potentially cardiotoxic chemotherapy. I would recommend switching from Inderal to Coreg 3.125 mg twice daily. Given low normal blood pressure, hold off adding ARB, but this would be the next step. He already has a visit scheduled to see Dr. Harl Bowie within a month.

## 2016-03-03 NOTE — Telephone Encounter (Signed)
I spoke with sister who is pt's POA, and agrees to change from Atenolol to Coreg and f/u with Dr Harl Bowie as planned

## 2016-03-04 ENCOUNTER — Encounter: Payer: Self-pay | Admitting: *Deleted

## 2016-03-04 ENCOUNTER — Other Ambulatory Visit (HOSPITAL_COMMUNITY): Payer: Self-pay | Admitting: Oncology

## 2016-03-04 MED ORDER — LIDOCAINE-PRILOCAINE 2.5-2.5 % EX CREA: TOPICAL_CREAM | CUTANEOUS | Status: AC

## 2016-03-04 NOTE — Patient Instructions (Addendum)
Tyler Aguilar   CHEMOTHERAPY INSTRUCTIONS  Premeds: Aloxi - for nausea/vomiting nausea/vomiting. Dexamethasone - steroid - given to reduce the risk of you having an allergic type reaction to the chemotherapy and it decreases nausea/vomiting. Dex can cause you to feel energized, nervous/anxious/jittery, make you have trouble sleeping, and/or make you feel hot/flushed in the face/neck and/or look pink/red in the face/neck. These side effects will pass as the Dex wears off. (takes 20 minutes to infuse)  Abraxane - myelosuppression (bone marrow suppression - lowers white blood cells, red blood cells, and platelets), sensory neuropathy, muscle and joint aches/pain, nausea/vomiting, diarrhea, mucositis, hair loss  (takes 30 minutes to infuse). You will receive this Day 1, 8, 15  Carboplatin - this medication can be hard on your kidneys - this is why we need you to drinking 64 oz of fluid (preferably water/decaff fluids) 2 days prior to chemo and for up to 4-5 days after chemo. Drink more if you can. This will help to keep your kidneys flushed. This can cause mild hair loss, lower your platelets (which keep you from bleeding out when you cut yourself), lower your white blood cells (fight infection), and cause nausea/vomiting. (only takes 30 minutes to infuse). You will receive this on Day 1 only.   POTENTIAL SIDE EFFECTS OF TREATMENT: Increased Susceptibility to Infection, Vomiting, Constipation, Hair Thinning, Changes in Character of Skin and Nails (brittleness, dryness,etc.), Bone Marrow Suppression, Abdominal Cramping, Complete Hair Loss, Nausea, Diarrhea, Sun Sensitivity and Mouth Sores     EDUCATIONAL MATERIALS GIVEN AND REVIEWED: Chemotherapy and You  Specific Instructions Sheets: Aloxi, Dexamethasone, Abraxane, Carboplatin, EMLA cream, Zofran tablets   SELF CARE ACTIVITIES WHILE ON CHEMOTHERAPY: Increase your fluid intake 48 hours prior to treatment and drink  at least 2 quarts per day after treatment., No alcohol intake., No aspirin or other medications unless approved by your oncologist., Eat foods that are light and easy to digest., Eat foods at cold or room temperature., No fried, fatty, or spicy foods immediately before or after treatment., Have teeth cleaned professionally before starting treatment. Keep dentures and partial plates clean., Use soft toothbrush and do not use mouthwashes that contain alcohol. Biotene is a good mouthwash that is available at most pharmacies or may be ordered by calling [phone]., Use warm salt water gargles (1 teaspoon salt per 1 quart warm water) before and after meals and at bedtime. Or you may rinse with 2 tablespoons of three -percent hydrogen peroxide mixed in eight ounces of water., Always use sunscreen with SPF (Sun Protection Factor) of 30 or higher., Use your nausea medication as directed to prevent nausea., Use your stool softener or laxative as directed to prevent constipation. and Use your anti-diarrheal medication as directed to stop diarrhea.  Please wash your hands for at least 30 seconds using warm soapy water. Handwashing is the #1 way to prevent the spread of germs. Stay away from sick people or people who are getting over a cold. If you develop respiratory systems such as green/yellow mucus production or productive cough or persistent cough let us know and we will see if you need an antibiotic. It is a good idea to keep a pair of gloves on when going into grocery stores/[employer] to decrease your risk of coming into contact with germs on the carts, etc. Carry alcohol hand gel with you at all times and use it frequently if out in public. All foods need to be cooked thoroughly. No raw foods. No  medium or undercooked meats, eggs. If your food is cooked medium well, it does not need to be hot pink or saturated with bloody liquid at all. Vegetables and fruits need to be washed/rinsed under the faucet with a dish  detergent before being consumed. You can eat raw fruits and vegetables unless we tell you otherwise but it would be best if you cooked them or bought frozen. Do not eat off of salad bars or hot bars unless you really trust the cleanliness of the restaurant. If you need dental work, please let [name] know before you go for your appointment so that we can coordinate the best possible time for you in regards to your chemo regimen. You need to also let your dentist know that you are actively taking chemo. We may need to do labs prior to your dental appointment. We also want your bowels moving at least every other day. If this is not happening, we need to know so that we can get you on a bowel regimen to help you go. If you are going to have sex, wear a condom. Do this for 28 days after the completion of chemo. This is to prevent chemotherapy exposure to your partner.    MEDICATIONS: You have been given prescriptions for the following medications:  Zofran '8mg'$  tablet. Take 1 tablet every 8 hours as needed for nausea/vomiting. (#1 nausea med to take, this can constipate)  EMLA cream. Apply a quarter size amount to port site 1 hour prior to chemo. Do not rub in. Cover with plastic wrap.   Over-the-Counter Meds:  Miralax 1 capful in 8 oz of fluid daily. May increase to two times a day if needed. This is a stool softener. If this doesn't work proceed you can add:  Senokot S  - start with 1 tablet two times a day and increase to 4 tablets two times a day if needed. (total of 8 tablets in a 24 hour period). This is a stimulant laxative.   Call us if this does not help your bowels move.   Imodium '2mg'$  capsule. Take 2 capsules after the 1st loose stool and then 1 capsule every 2 hours until you go a total of 12 hours without having a loose stool. Call the [facility] if loose stools continue. If diarrhea occurs @ bedtime, take 2 capsules @ bedtime. Then take 2 capsules every 4 hours until morning. Call  [facility].     SYMPTOMS TO REPORT AS SOON AS POSSIBLE AFTER TREATMENT:  FEVER GREATER THAN 100.5 F  CHILLS WITH OR WITHOUT FEVER  NAUSEA AND VOMITING THAT IS NOT CONTROLLED WITH YOUR NAUSEA MEDICATION  UNUSUAL SHORTNESS OF BREATH  UNUSUAL BRUISING OR BLEEDING  TENDERNESS IN MOUTH AND THROAT WITH OR WITHOUT PRESENCE OF ULCERS  URINARY PROBLEMS  BOWEL PROBLEMS  UNUSUAL RASH    Wear comfortable clothing and clothing appropriate for easy access to any Portacath or PICC line. Let us know if there is anything that we can do to make your therapy better!      I have been informed and understand all of the instructions given to me and have received a copy. I have been instructed to call the clinic [phone] or my family physician as soon as possible for continued medical care, if indicated. I do not have any more questions at this time but understand that I may call the [facility] or the Patient Navigator at [phone] during office hours should I have questions  or need assistance in obtaining follow-up care.            Palonosetron Injection What is this medicine? PALONOSETRON (pal oh NOE se tron) is used to prevent nausea and vomiting caused by chemotherapy. It also helps prevent delayed nausea and vomiting that may occur a few days after your treatment. This medicine may be used for other purposes; ask your health care provider or pharmacist if you have questions. What should I tell my health care provider before I take this medicine? They need to know if you have any of these conditions: -an unusual or allergic reaction to palonosetron, dolasetron, granisetron, ondansetron, other medicines, foods, dyes, or preservatives -pregnant or trying to get pregnant -breast-feeding How should I use this medicine? This medicine is for infusion into a vein. It is given by a health care professional in a hospital or clinic setting. Talk to your pediatrician  regarding the use of this medicine in children. While this drug may be prescribed for children as young as 1 month for selected conditions, precautions do apply. Overdosage: If you think you have taken too much of this medicine contact a poison control center or emergency room at once. NOTE: This medicine is only for you. Do not share this medicine with others. What if I miss a dose? This does not apply. What may interact with this medicine? -certain medicines for depression, anxiety, or psychotic disturbances -fentanyl -linezolid -MAOIs like Carbex, Eldepryl, Marplan, Nardil, and Parnate -methylene blue (injected into a vein) -tramadol This list may not describe all possible interactions. Give your health care provider a list of all the medicines, herbs, non-prescription drugs, or dietary supplements you use. Also tell them if you smoke, drink alcohol, or use illegal drugs. Some items may interact with your medicine. What should I watch for while using this medicine? Your condition will be monitored carefully while you are receiving this medicine. What side effects may I notice from receiving this medicine? Side effects that you should report to your doctor or health care professional as soon as possible: -allergic reactions like skin rash, itching or hives, swelling of the face, lips, or tongue -breathing problems -confusion -dizziness -fast, irregular heartbeat -fever and chills -loss of balance or coordination -seizures -sweating -swelling of the hands and feet -tremors -unusually weak or tired Side effects that usually do not require medical attention (report to your doctor or health care professional if they continue or are bothersome): -constipation or diarrhea -headache This list may not describe all possible side effects. Call your doctor for medical advice about side effects. You may report side effects to FDA at 1-800-FDA-1088. Where should I keep my medicine? This drug  is given in a hospital or clinic and will not be stored at home. NOTE: This sheet is a summary. It may not cover all possible information. If you have questions about this medicine, talk to your doctor, pharmacist, or health care provider.    2016, Elsevier/Gold Standard. (2013-10-07 10:38:36) Dexamethasone injection What is this medicine? DEXAMETHASONE (dex a METH a sone) is a corticosteroid. It is used to treat inflammation of the skin, joints, lungs, and other organs. Common conditions treated include asthma, allergies, and arthritis. It is also used for other conditions, like blood disorders and diseases of the adrenal glands. This medicine may be used for other purposes; ask your health care provider or pharmacist if you have questions. What should I tell my health care provider before I take this medicine? They need to know  if you have any of these conditions: -blood clotting problems -Cushing's syndrome -diabetes -glaucoma -heart problems or disease -high blood pressure -infection like herpes, measles, tuberculosis, or chickenpox -kidney disease -liver disease -mental problems -myasthenia gravis -osteoporosis -previous heart attack -seizures -stomach, ulcer or intestine disease including colitis and diverticulitis -thyroid problem -an unusual or allergic reaction to dexamethasone, corticosteroids, other medicines, lactose, foods, dyes, or preservatives -pregnant or trying to get pregnant -breast-feeding How should I use this medicine? This medicine is for injection into a muscle, joint, lesion, soft tissue, or vein. It is given by a health care professional in a hospital or clinic setting. Talk to your pediatrician regarding the use of this medicine in children. Special care may be needed. Overdosage: If you think you have taken too much of this medicine contact a poison control center or emergency room at once. NOTE: This medicine is only for you. Do not share this medicine  with others. What if I miss a dose? This may not apply. If you are having a series of injections over a prolonged period, try not to miss an appointment. Call your doctor or health care professional to reschedule if you are unable to keep an appointment. What may interact with this medicine? Do not take this medicine with any of the following medications: -mifepristone, RU-486 -vaccines This medicine may also interact with the following medications: -amphotericin B -antibiotics like clarithromycin, erythromycin, and troleandomycin -aspirin and aspirin-like drugs -barbiturates like phenobarbital -carbamazepine -cholestyramine -cholinesterase inhibitors like donepezil, galantamine, rivastigmine, and tacrine -cyclosporine -digoxin -diuretics -ephedrine -male hormones, like estrogens or progestins and birth control pills -indinavir -isoniazid -ketoconazole -medicines for diabetes -medicines that improve muscle tone or strength for conditions like myasthenia gravis -NSAIDs, medicines for pain and inflammation, like ibuprofen or naproxen -phenytoin -rifampin -thalidomide -warfarin This list may not describe all possible interactions. Give your health care provider a list of all the medicines, herbs, non-prescription drugs, or dietary supplements you use. Also tell them if you smoke, drink alcohol, or use illegal drugs. Some items may interact with your medicine. What should I watch for while using this medicine? Your condition will be monitored carefully while you are receiving this medicine. If you are taking this medicine for a long time, carry an identification card with your name and address, the type and dose of your medicine, and your doctor's name and address. This medicine may increase your risk of getting an infection. Stay away from people who are sick. Tell your doctor or health care professional if you are around anyone with measles or chickenpox. Talk to your health care  provider before you get any vaccines that you take this medicine. If you are going to have surgery, tell your doctor or health care professional that you have taken this medicine within the last twelve months. Ask your doctor or health care professional about your diet. You may need to lower the amount of salt you eat. The medicine can increase your blood sugar. If you are a diabetic check with your doctor if you need help adjusting the dose of your diabetic medicine. What side effects may I notice from receiving this medicine? Side effects that you should report to your doctor or health care professional as soon as possible: -allergic reactions like skin rash, itching or hives, swelling of the face, lips, or tongue -black or tarry stools -change in the amount of urine -changes in vision -confusion, excitement, restlessness, a false sense of well-being -fever, sore throat, sneezing, cough, or other signs  of infection, wounds that will not heal -hallucinations -increased thirst -mental depression, mood swings, mistaken feelings of self importance or of being mistreated -pain in hips, back, ribs, arms, shoulders, or legs -pain, redness, or irritation at the injection site -redness, blistering, peeling or loosening of the skin, including inside the mouth -rounding out of face -swelling of feet or lower legs -unusual bleeding or bruising -unusual tired or weak -wounds that do not heal Side effects that usually do not require medical attention (report to your doctor or health care professional if they continue or are bothersome): -diarrhea or constipation -change in taste -headache -nausea, vomiting -skin problems, acne, thin and shiny skin -touble sleeping -unusual growth of hair on the face or body -weight gain This list may not describe all possible side effects. Call your doctor for medical advice about side effects. You may report side effects to FDA at 1-800-FDA-1088. Where should I  keep my medicine? This drug is given in a hospital or clinic and will not be stored at home. NOTE: This sheet is a summary. It may not cover all possible information. If you have questions about this medicine, talk to your doctor, pharmacist, or health care provider.    2016, Elsevier/Gold Standard. (2008-03-23 14:04:12) Nanoparticle Albumin-Bound Paclitaxel injection What is this medicine? NANOPARTICLE ALBUMIN-BOUND PACLITAXEL (Na no PAHR ti kuhl al BYOO muhn-bound PAK li TAX el) is a chemotherapy drug. It targets fast dividing cells, like cancer cells, and causes these cells to die. This medicine is used to treat advanced breast cancer and advanced lung cancer. This medicine may be used for other purposes; ask your health care provider or pharmacist if you have questions. What should I tell my health care provider before I take this medicine? They need to know if you have any of these conditions: -kidney disease -liver disease -low blood counts, like low platelets, red blood cells, or white blood cells -recent or ongoing radiation therapy -an unusual or allergic reaction to paclitaxel, albumin, other chemotherapy, other medicines, foods, dyes, or preservatives -pregnant or trying to get pregnant -breast-feeding How should I use this medicine? This drug is given as an infusion into a vein. It is administered in a hospital or clinic by a specially trained health care professional. Talk to your pediatrician regarding the use of this medicine in children. Special care may be needed. Overdosage: If you think you have taken too much of this medicine contact a poison control center or emergency room at once. NOTE: This medicine is only for you. Do not share this medicine with others. What if I miss a dose? It is important not to miss your dose. Call your doctor or health care professional if you are unable to keep an appointment. What may interact with this  medicine? -cyclosporine -diazepam -ketoconazole -medicines to increase blood counts like filgrastim, pegfilgrastim, sargramostim -other chemotherapy drugs like cisplatin, doxorubicin, epirubicin, etoposide, teniposide, vincristine -quinidine -testosterone -vaccines -verapamil Talk to your doctor or health care professional before taking any of these medicines: -acetaminophen -aspirin -ibuprofen -ketoprofen -naproxen This list may not describe all possible interactions. Give your health care provider a list of all the medicines, herbs, non-prescription drugs, or dietary supplements you use. Also tell them if you smoke, drink alcohol, or use illegal drugs. Some items may interact with your medicine. What should I watch for while using this medicine? Your condition will be monitored carefully while you are receiving this medicine. You will need important blood work done while you are taking this  medicine. This drug may make you feel generally unwell. This is not uncommon, as chemotherapy can affect healthy cells as well as cancer cells. Report any side effects. Continue your course of treatment even though you feel ill unless your doctor tells you to stop. In some cases, you may be given additional medicines to help with side effects. Follow all directions for their use. Call your doctor or health care professional for advice if you get a fever, chills or sore throat, or other symptoms of a cold or flu. Do not treat yourself. This drug decreases your body's ability to fight infections. Try to avoid being around people who are sick. This medicine may increase your risk to bruise or bleed. Call your doctor or health care professional if you notice any unusual bleeding. Be careful brushing and flossing your teeth or using a toothpick because you may get an infection or bleed more easily. If you have any dental work done, tell your dentist you are receiving this medicine. Avoid taking products that  contain aspirin, acetaminophen, ibuprofen, naproxen, or ketoprofen unless instructed by your doctor. These medicines may hide a fever. Do not become pregnant while taking this medicine. Women should inform their doctor if they wish to become pregnant or think they might be pregnant. There is a potential for serious side effects to an unborn child. Talk to your health care professional or pharmacist for more information. Do not breast-feed an infant while taking this medicine. Men are advised not to father a child while receiving this medicine. What side effects may I notice from receiving this medicine? Side effects that you should report to your doctor or health care professional as soon as possible: -allergic reactions like skin rash, itching or hives, swelling of the face, lips, or tongue -low blood counts - This drug may decrease the number of white blood cells, red blood cells and platelets. You may be at increased risk for infections and bleeding. -signs of infection - fever or chills, cough, sore throat, pain or difficulty passing urine -signs of decreased platelets or bleeding - bruising, pinpoint red spots on the skin, black, tarry stools, nosebleeds -signs of decreased red blood cells - unusually weak or tired, fainting spells, lightheadedness -breathing problems -changes in vision -chest pain -high or low blood pressure -mouth sores -nausea and vomiting -pain, swelling, redness or irritation at the injection site -pain, tingling, numbness in the hands or feet -slow or irregular heartbeat -swelling of the ankle, feet, hands Side effects that usually do not require medical attention (report to your doctor or health care professional if they continue or are bothersome): -aches, pains -changes in the color of fingernails -diarrhea -hair loss -loss of appetite This list may not describe all possible side effects. Call your doctor for medical advice about side effects. You may report  side effects to FDA at 1-800-FDA-1088. Where should I keep my medicine? This drug is given in a hospital or clinic and will not be stored at home. NOTE: This sheet is a summary. It may not cover all possible information. If you have questions about this medicine, talk to your doctor, pharmacist, or health care provider.    2016, Elsevier/Gold Standard. (2013-01-24 16:48:50) Carboplatin injection What is this medicine? CARBOPLATIN (KAR boe pla tin) is a chemotherapy drug. It targets fast dividing cells, like cancer cells, and causes these cells to die. This medicine is used to treat ovarian cancer and many other cancers. This medicine may be used for other purposes; ask  your health care provider or pharmacist if you have questions. What should I tell my health care provider before I take this medicine? They need to know if you have any of these conditions: -blood disorders -hearing problems -kidney disease -recent or ongoing radiation therapy -an unusual or allergic reaction to carboplatin, cisplatin, other chemotherapy, other medicines, foods, dyes, or preservatives -pregnant or trying to get pregnant -breast-feeding How should I use this medicine? This drug is usually given as an infusion into a vein. It is administered in a hospital or clinic by a specially trained health care professional. Talk to your pediatrician regarding the use of this medicine in children. Special care may be needed. Overdosage: If you think you have taken too much of this medicine contact a poison control center or emergency room at once. NOTE: This medicine is only for you. Do not share this medicine with others. What if I miss a dose? It is important not to miss a dose. Call your doctor or health care professional if you are unable to keep an appointment. What may interact with this medicine? -medicines for seizures -medicines to increase blood counts like filgrastim, pegfilgrastim, sargramostim -some  antibiotics like amikacin, gentamicin, neomycin, streptomycin, tobramycin -vaccines Talk to your doctor or health care professional before taking any of these medicines: -acetaminophen -aspirin -ibuprofen -ketoprofen -naproxen This list may not describe all possible interactions. Give your health care provider a list of all the medicines, herbs, non-prescription drugs, or dietary supplements you use. Also tell them if you smoke, drink alcohol, or use illegal drugs. Some items may interact with your medicine. What should I watch for while using this medicine? Your condition will be monitored carefully while you are receiving this medicine. You will need important blood work done while you are taking this medicine. This drug may make you feel generally unwell. This is not uncommon, as chemotherapy can affect healthy cells as well as cancer cells. Report any side effects. Continue your course of treatment even though you feel ill unless your doctor tells you to stop. In some cases, you may be given additional medicines to help with side effects. Follow all directions for their use. Call your doctor or health care professional for advice if you get a fever, chills or sore throat, or other symptoms of a cold or flu. Do not treat yourself. This drug decreases your body's ability to fight infections. Try to avoid being around people who are sick. This medicine may increase your risk to bruise or bleed. Call your doctor or health care professional if you notice any unusual bleeding. Be careful brushing and flossing your teeth or using a toothpick because you may get an infection or bleed more easily. If you have any dental work done, tell your dentist you are receiving this medicine. Avoid taking products that contain aspirin, acetaminophen, ibuprofen, naproxen, or ketoprofen unless instructed by your doctor. These medicines may hide a fever. Do not become pregnant while taking this medicine. Women should  inform their doctor if they wish to become pregnant or think they might be pregnant. There is a potential for serious side effects to an unborn child. Talk to your health care professional or pharmacist for more information. Do not breast-feed an infant while taking this medicine. What side effects may I notice from receiving this medicine? Side effects that you should report to your doctor or health care professional as soon as possible: -allergic reactions like skin rash, itching or hives, swelling of the face, lips,  or tongue -signs of infection - fever or chills, cough, sore throat, pain or difficulty passing urine -signs of decreased platelets or bleeding - bruising, pinpoint red spots on the skin, black, tarry stools, nosebleeds -signs of decreased red blood cells - unusually weak or tired, fainting spells, lightheadedness -breathing problems -changes in hearing -changes in vision -chest pain -high blood pressure -low blood counts - This drug may decrease the number of white blood cells, red blood cells and platelets. You may be at increased risk for infections and bleeding. -nausea and vomiting -pain, swelling, redness or irritation at the injection site -pain, tingling, numbness in the hands or feet -problems with balance, talking, walking -trouble passing urine or change in the amount of urine Side effects that usually do not require medical attention (report to your doctor or health care professional if they continue or are bothersome): -hair loss -loss of appetite -metallic taste in the mouth or changes in taste This list may not describe all possible side effects. Call your doctor for medical advice about side effects. You may report side effects to FDA at 1-800-FDA-1088. Where should I keep my medicine? This drug is given in a hospital or clinic and will not be stored at home. NOTE: This sheet is a summary. It may not cover all possible information. If you have questions about  this medicine, talk to your doctor, pharmacist, or health care provider.    2016, Elsevier/Gold Standard. (2008-03-07 14:38:05) Ondansetron tablets What is this medicine? ONDANSETRON (on DAN se tron) is used to treat nausea and vomiting caused by chemotherapy. It is also used to prevent or treat nausea and vomiting after surgery. This medicine may be used for other purposes; ask your health care provider or pharmacist if you have questions. What should I tell my health care provider before I take this medicine? They need to know if you have any of these conditions: -heart disease -history of irregular heartbeat -liver disease -low levels of magnesium or potassium in the blood -an unusual or allergic reaction to ondansetron, granisetron, other medicines, foods, dyes, or preservatives -pregnant or trying to get pregnant -breast-feeding How should I use this medicine? Take this medicine by mouth with a glass of water. Follow the directions on your prescription label. Take your doses at regular intervals. Do not take your medicine more often than directed. Talk to your pediatrician regarding the use of this medicine in children. Special care may be needed. Overdosage: If you think you have taken too much of this medicine contact a poison control center or emergency room at once. NOTE: This medicine is only for you. Do not share this medicine with others. What if I miss a dose? If you miss a dose, take it as soon as you can. If it is almost time for your next dose, take only that dose. Do not take double or extra doses. What may interact with this medicine? Do not take this medicine with any of the following medications: -apomorphine -certain medicines for fungal infections like fluconazole, itraconazole, ketoconazole, posaconazole, voriconazole -cisapride -dofetilide -dronedarone -pimozide -thioridazine -ziprasidone This medicine may also interact with the following  medications: -carbamazepine -certain medicines for depression, anxiety, or psychotic disturbances -fentanyl -linezolid -MAOIs like Carbex, Eldepryl, Marplan, Nardil, and Parnate -methylene blue (injected into a vein) -other medicines that prolong the QT interval (cause an abnormal heart rhythm) -phenytoin -rifampicin -tramadol This list may not describe all possible interactions. Give your health care provider a list of all the medicines, herbs, non-prescription  drugs, or dietary supplements you use. Also tell them if you smoke, drink alcohol, or use illegal drugs. Some items may interact with your medicine. What should I watch for while using this medicine? Check with your doctor or health care professional right away if you have any sign of an allergic reaction. What side effects may I notice from receiving this medicine? Side effects that you should report to your doctor or health care professional as soon as possible: -allergic reactions like skin rash, itching or hives, swelling of the face, lips or tongue -breathing problems -confusion -dizziness -fast or irregular heartbeat -feeling faint or lightheaded, falls -fever and chills -loss of balance or coordination -seizures -sweating -swelling of the hands or feet -tightness in the chest -tremors -unusually weak or tired Side effects that usually do not require medical attention (report to your doctor or health care professional if they continue or are bothersome): -constipation or diarrhea -headache This list may not describe all possible side effects. Call your doctor for medical advice about side effects. You may report side effects to FDA at 1-800-FDA-1088. Where should I keep my medicine? Keep out of the reach of children. Store between 2 and 30 degrees C (36 and 86 degrees F). Throw away any unused medicine after the expiration date. NOTE: This sheet is a summary. It may not cover all possible information. If you have  questions about this medicine, talk to your doctor, pharmacist, or health care provider.    2016, Elsevier/Gold Standard. (2013-09-07 16:27:45) Lidocaine; Prilocaine cream What is this medicine? LIDOCAINE; PRILOCAINE (LYE doe kane; PRIL oh kane) is a topical anesthetic that causes loss of feeling in the skin and surrounding tissues. It is used to numb the skin before procedures or injections. This medicine may be used for other purposes; ask your health care provider or pharmacist if you have questions. What should I tell my health care provider before I take this medicine? They need to know if you have any of these conditions: -glucose-6-phosphate deficiencies -heart disease -kidney or liver disease -methemoglobinemia -an unusual or allergic reaction to lidocaine, prilocaine, other medicines, foods, dyes, or preservatives -pregnant or trying to get pregnant -breast-feeding How should I use this medicine? This medicine is for external use only on the skin. Do not take by mouth. Follow the directions on the prescription label. Wash hands before and after use. Do not use more or leave in contact with the skin longer than directed. Do not apply to eyes or open wounds. It can cause irritation and blurred or temporary loss of vision. If this medicine comes in contact with your eyes, immediately rinse the eye with water. Do not touch or rub the eye. Contact your health care provider right away. Talk to your pediatrician regarding the use of this medicine in children. While this medicine may be prescribed for children for selected conditions, precautions do apply. Overdosage: If you think you have taken too much of this medicine contact a poison control center or emergency room at once. NOTE: This medicine is only for you. Do not share this medicine with others. What if I miss a dose? This medicine is usually only applied once prior to each procedure. It must be in contact with the skin for a period  of time for it to work. If you applied this medicine later than directed, tell your health care professional before starting the procedure. What may interact with this medicine? -acetaminophen -chloroquine -dapsone -medicines to control heart rhythm -nitrates like nitroglycerin  and nitroprusside -other ointments, creams, or sprays that may contain anesthetic medicine -phenobarbital -phenytoin -quinine -sulfonamides like sulfacetamide, sulfamethoxazole, sulfasalazine and others This list may not describe all possible interactions. Give your health care provider a list of all the medicines, herbs, non-prescription drugs, or dietary supplements you use. Also tell them if you smoke, drink alcohol, or use illegal drugs. Some items may interact with your medicine. What should I watch for while using this medicine? Be careful to avoid injury to the treated area while it is numb and you are not aware of pain. Avoid scratching, rubbing, or exposing the treated area to hot or cold temperatures until complete sensation has returned. The numb feeling will wear off a few hours after applying the cream. What side effects may I notice from receiving this medicine? Side effects that you should report to your doctor or health care professional as soon as possible: -blurred vision -chest pain -difficulty breathing -dizziness -drowsiness -fast or irregular heartbeat -skin rash or itching -swelling of your throat, lips, or face -trembling Side effects that usually do not require medical attention (report to your doctor or health care professional if they continue or are bothersome): -changes in ability to feel hot or cold -redness and swelling at the application site This list may not describe all possible side effects. Call your doctor for medical advice about side effects. You may report side effects to FDA at 1-800-FDA-1088. Where should I keep my medicine? Keep out of reach of children. Store at room  temperature between 15 and 30 degrees C (59 and 86 degrees F). Keep container tightly closed. Throw away any unused medicine after the expiration date. NOTE: This sheet is a summary. It may not cover all possible information. If you have questions about this medicine, talk to your doctor, pharmacist, or health care provider.    2016, Elsevier/Gold Standard. (2008-06-05 17:14:35)

## 2016-03-04 NOTE — Progress Notes (Signed)
[place] Clinical Social Work  Clinical Social Work was referred by nurse for assessment of psychosocial needs due to new cancer diagnosis. Clinical Social Worker contacted patient to offer support and assess for needs.  CSW phoned pt's home number and spoke with sister. Sister stated pt was not available currently. CSW introduced self and explained role of CSW. Sister denied current needs, but shared they may need financial concerns at a later date. CSW suggested services of financial counselor as well. CSW encouraged pt and sister to reach out if needs arise.     Clinical Social Work interventions: Resource education  Loren Racer, [place] Tuesdays   [phone]

## 2016-03-05 ENCOUNTER — Other Ambulatory Visit (HOSPITAL_COMMUNITY): Payer: Self-pay | Admitting: Oncology

## 2016-03-05 ENCOUNTER — Encounter (HOSPITAL_COMMUNITY): Payer: Medicare PPO

## 2016-03-05 DIAGNOSIS — C3491 Malignant neoplasm of unspecified part of right bronchus or lung: Secondary | ICD-10-CM

## 2016-03-05 NOTE — Progress Notes (Signed)
Chemo teaching done and consent signed for Abraxane and Carboplatin. Med calendar given to patient. Distress screening done. Pt to start tx on 03/07/16.

## 2016-03-07 ENCOUNTER — Ambulatory Visit (HOSPITAL_COMMUNITY)
Admission: RE | Admit: 2016-03-07 | Discharge: 2016-03-07 | Disposition: A | Discharge status: Home / Self Care | Payer: Medicare PPO | Source: Ambulatory Visit | Attending: Oncology | Admitting: Oncology

## 2016-03-07 ENCOUNTER — Other Ambulatory Visit (HOSPITAL_COMMUNITY): Payer: Self-pay | Admitting: Oncology

## 2016-03-07 ENCOUNTER — Encounter (HOSPITAL_BASED_OUTPATIENT_CLINIC_OR_DEPARTMENT_OTHER): Payer: Medicare PPO | Admitting: Oncology

## 2016-03-07 ENCOUNTER — Encounter (HOSPITAL_BASED_OUTPATIENT_CLINIC_OR_DEPARTMENT_OTHER): Payer: Medicare PPO

## 2016-03-07 VITALS — BP 125/86 | HR 97 | Temp 98.0°F | Resp 18 | Wt 137.4 lb

## 2016-03-07 DIAGNOSIS — C3490 Malignant neoplasm of unspecified part of unspecified bronchus or lung: Secondary | ICD-10-CM | POA: Insufficient documentation

## 2016-03-07 DIAGNOSIS — C3491 Malignant neoplasm of unspecified part of right bronchus or lung: Secondary | ICD-10-CM

## 2016-03-07 DIAGNOSIS — R918 Other nonspecific abnormal finding of lung field: Secondary | ICD-10-CM | POA: Diagnosis not present

## 2016-03-07 DIAGNOSIS — R042 Hemoptysis: Secondary | ICD-10-CM | POA: Insufficient documentation

## 2016-03-07 DIAGNOSIS — R9389 Abnormal findings on diagnostic imaging of other specified body structures: Secondary | ICD-10-CM

## 2016-03-07 DIAGNOSIS — Z5111 Encounter for antineoplastic chemotherapy: Secondary | ICD-10-CM

## 2016-03-07 DIAGNOSIS — R938 Abnormal findings on diagnostic imaging of other specified body structures: Secondary | ICD-10-CM

## 2016-03-07 LAB — CBC WITH DIFFERENTIAL/PLATELET
Basophils Absolute: 0 10*3/uL (ref 0.0–0.1)
Basophils Relative: 0 %
EOS PCT: 1 %
Eosinophils Absolute: 0.1 10*3/uL (ref 0.0–0.7)
HEMATOCRIT: 29.8 % — AB (ref 39.0–52.0)
Hemoglobin: 9.9 g/dL — ABNORMAL LOW (ref 13.0–17.0)
LYMPHS ABS: 0.5 10*3/uL — AB (ref 0.7–4.0)
LYMPHS PCT: 5 %
MCH: 32.4 pg (ref 26.0–34.0)
MCHC: 33.2 g/dL (ref 30.0–36.0)
MCV: 97.4 fL (ref 78.0–100.0)
MONO ABS: 1.1 10*3/uL — AB (ref 0.1–1.0)
Monocytes Relative: 12 %
NEUTROS ABS: 7.7 10*3/uL (ref 1.7–7.7)
Neutrophils Relative %: 82 %
PLATELETS: 421 10*3/uL — AB (ref 150–400)
RBC: 3.06 MIL/uL — AB (ref 4.22–5.81)
RDW: 15.7 % — ABNORMAL HIGH (ref 11.5–15.5)
WBC: 9.4 10*3/uL (ref 4.0–10.5)

## 2016-03-07 LAB — COMPREHENSIVE METABOLIC PANEL
ALT: 24 U/L (ref 17–63)
AST: 25 U/L (ref 15–41)
Albumin: 2.4 g/dL — ABNORMAL LOW (ref 3.5–5.0)
Alkaline Phosphatase: 63 U/L (ref 38–126)
Anion gap: 8 (ref 5–15)
BILIRUBIN TOTAL: 0.7 mg/dL (ref 0.3–1.2)
BUN: 9 mg/dL (ref 6–20)
CALCIUM: 8.3 mg/dL — AB (ref 8.9–10.3)
CHLORIDE: 103 mmol/L (ref 101–111)
CO2: 25 mmol/L (ref 22–32)
CREATININE: 0.61 mg/dL (ref 0.61–1.24)
Glucose, Bld: 133 mg/dL — ABNORMAL HIGH (ref 65–99)
Potassium: 3.7 mmol/L (ref 3.5–5.1)
Sodium: 136 mmol/L (ref 135–145)
TOTAL PROTEIN: 6.1 g/dL — AB (ref 6.5–8.1)

## 2016-03-07 MED ORDER — SODIUM CHLORIDE 0.9 % IV SOLN
660.0000 mg | Freq: Once | INTRAVENOUS | Status: AC
  Administered 2016-03-07: 660 mg via INTRAVENOUS
  Filled 2016-03-07: qty 66

## 2016-03-07 MED ORDER — SODIUM CHLORIDE 0.9 % IV SOLN
10.0000 mg | Freq: Once | INTRAVENOUS | Status: AC
  Administered 2016-03-07: 10 mg via INTRAVENOUS
  Filled 2016-03-07: qty 1

## 2016-03-07 MED ORDER — PALONOSETRON HCL INJECTION 0.25 MG/5ML
0.2500 mg | Freq: Once | INTRAVENOUS | Status: AC
  Administered 2016-03-07: 0.25 mg via INTRAVENOUS
  Filled 2016-03-07: qty 5

## 2016-03-07 MED ORDER — LORAZEPAM 2 MG/ML IJ SOLN
0.5000 mg | Freq: Once | INTRAMUSCULAR | Status: AC
  Administered 2016-03-07: 0.5 mg via INTRAVENOUS

## 2016-03-07 MED ORDER — HEPARIN SOD (PORK) LOCK FLUSH 100 UNIT/ML IV SOLN
500.0000 [IU] | Freq: Once | INTRAVENOUS | Status: AC | PRN
  Administered 2016-03-07: 500 [IU]
  Filled 2016-03-07: qty 5

## 2016-03-07 MED ORDER — SODIUM CHLORIDE 0.9% FLUSH
10.0000 mL | INTRAVENOUS | Status: DC | PRN
  Administered 2016-03-07: 10 mL
  Filled 2016-03-07: qty 10

## 2016-03-07 MED ORDER — SODIUM CHLORIDE 0.9 % IV SOLN
Freq: Once | INTRAVENOUS | Status: AC
  Administered 2016-03-07: 11:00:00 via INTRAVENOUS

## 2016-03-07 MED ORDER — PACLITAXEL PROTEIN-BOUND CHEMO INJECTION 100 MG
100.0000 mg/m2 | Freq: Once | INTRAVENOUS | Status: AC
  Administered 2016-03-07: 175 mg via INTRAVENOUS
  Filled 2016-03-07: qty 35

## 2016-03-07 MED ORDER — LORAZEPAM 2 MG/ML IJ SOLN
INTRAMUSCULAR | Status: AC
  Filled 2016-03-07: qty 1

## 2016-03-07 MED ORDER — LEVOFLOXACIN 500 MG PO TABS: 500.0000 mg | ORAL_TABLET | Freq: Every day | ORAL | Status: DC

## 2016-03-07 NOTE — Addendum Note (Signed)
Addended by: Baird Cancer on: 03/07/2016 05:25 PM   Modules accepted: Orders, Level of Service

## 2016-03-07 NOTE — Progress Notes (Signed)
Tolerated chemo well. Ambulatory on discharge home with sister.

## 2016-03-07 NOTE — Patient Instructions (Signed)
[facility] Discharge Instructions for Patients Receiving Chemotherapy   Beginning January 23rd 2017 lab work for the [facility] will be done in the  Main lab at [facility] on 1st floor. If you have a lab appointment with the [facility] please come in thru the  Main Entrance and check in at the main information desk   Today you received the following chemotherapy agents Abraxane and Carbo Cycle 1 Day 1.  To help prevent nausea and vomiting after your treatment, we encourage you to take your nausea medication as instructed.  Stop by Radiology today on your way out for chest xray. We will call you with any unexpected results.  Levaquin (antibiotic) x 10 days sent to [facility] in [place].   If you develop nausea and vomiting, or diarrhea that is not controlled by your medication, call the clinic.  The clinic phone number is [phone]. Office hours are Monday-Friday 8:30am-5:00pm.  BELOW ARE SYMPTOMS THAT SHOULD BE REPORTED IMMEDIATELY:  *FEVER GREATER THAN 101.0 F  *CHILLS WITH OR WITHOUT FEVER  NAUSEA AND VOMITING THAT IS NOT CONTROLLED WITH YOUR NAUSEA MEDICATION  *UNUSUAL SHORTNESS OF BREATH  *UNUSUAL BRUISING OR BLEEDING  TENDERNESS IN MOUTH AND THROAT WITH OR WITHOUT PRESENCE OF ULCERS  *URINARY PROBLEMS  *BOWEL PROBLEMS  UNUSUAL RASH Items with * indicate a potential emergency and should be followed up as soon as possible. If you have an emergency after office hours please contact your primary care physician or go to the nearest emergency department.  Please call the clinic during office hours if you have any questions or concerns.   You may also contact the Patient Navigator at [phone] should you have any questions or need assistance in obtaining follow up care.  Resources For Cancer Patients and their Caregivers ? American Cancer Society: Can assist with transportation, wigs, general needs, runs Look Good Feel Better.         902-265-5340 ? Cancer Care: Provides financial assistance, online support groups, medication/co-pay assistance.  1-800-813-HOPE 202 275 0370) ? [place] Assists [place] cancer patients and their families through emotional , educational and financial support.  915-050-6680 ? [place] DSS Where to apply for food stamps, Medicaid and utility assistance. (787) 226-8711 ? RCATS: Transportation to medical appointments. 2623589582 ? Social Security Administration: May apply for disability if have a Stage IV cancer. [phone] ? [employer], Disability and Transit Services: Assists with nutrition, care and transit needs. [phone]

## 2016-03-07 NOTE — Progress Notes (Addendum)
Patient is seen as a work in today. He is here for day 1 of cycle 1 of chemotherapy. We are able to get his chemotherapy regimen approved yesterday. He is in the treatment area accompanied by his sister, Rolene Arbour, receiving IV chemotherapy. He is in a recliner. He is eating lunch.  He does note some hemoptysis. He says is much less than it was prior to his radiation therapy [facility]. He notes that his hemoptysis consist of small amounts of blood with clear spit.  He denies any hemoptysis. He does note on 2 occasions eating a salty cracker that cause discomfort and he assumed that maybe he injured his esophagus resulting in him coughing up a little bit of blood. He also notes a similar episode eating fried pork chop.  He reports that intermittently he will blow his nose and see dried blood clots from his left nare. He denies any active epistaxis and only notes this when he blows his nose. He is suspicious that his nose may be bleeding at times and when he coughs he's clearing blood that being a post-nasal drip.  He reports he is not concerned about his hemoptysis.    He coughed up some spit with me present and there was minimal amount of blood incorporated in the clear sputum.  Santiago Glad confirms that is the amount that she has witnessed.  I will order a chest xray which can be completed today after chemotherapy.  Case is discussed with [name].  She recommends Levaquin x 10 days.  New Rx is escribed to his pharmacy, [facility], [place], [place].  Patient and plan discussed with Dr. Ancil Linsey and she is in agreement with the aforementioned.   Robynn Pane, PA-C 03/07/2016 12:41 PM  Addendum:  Chest xray is abnormal and radiologist recommends Ct imaging.  Order placed for CT of chest w contrast and will be completed in the next couple of days.  Patient is advised to be compliant with antibiotic and report to ED with any fevers.  Oluwatomiwa Kinyon, PA-C

## 2016-03-10 ENCOUNTER — Telehealth (HOSPITAL_COMMUNITY): Payer: Self-pay | Admitting: *Deleted

## 2016-03-10 ENCOUNTER — Other Ambulatory Visit (HOSPITAL_COMMUNITY): Payer: Self-pay | Admitting: *Deleted

## 2016-03-10 DIAGNOSIS — C3491 Malignant neoplasm of unspecified part of right bronchus or lung: Secondary | ICD-10-CM

## 2016-03-10 DIAGNOSIS — R066 Hiccough: Secondary | ICD-10-CM | POA: Insufficient documentation

## 2016-03-10 MED ORDER — BACLOFEN 10 MG PO TABS: 10.0000 mg | ORAL_TABLET | Freq: Three times a day (TID) | ORAL | Status: DC | PRN

## 2016-03-10 NOTE — Telephone Encounter (Signed)
24h follow up: So far so good per patient's sister. Appetite is not as good as it was. Hair coming out some. Pt is somewhat agitated today. Chest is burning. Pt taking Zantac and Protonix. Sister thinks the burning is from the XRT. Pt doesn't like the taste of Magic Mouthwash so he isn't using it. Patient is getting tired of all his doctor visits per sister. Pt coming for CT scan of chest on Wed and to see [name] on Friday.

## 2016-03-12 ENCOUNTER — Ambulatory Visit (HOSPITAL_COMMUNITY)
Admission: RE | Admit: 2016-03-12 | Discharge: 2016-03-12 | Disposition: A | Discharge status: Home / Self Care | Payer: Medicare PPO | Source: Ambulatory Visit | Attending: Oncology | Admitting: Oncology

## 2016-03-12 DIAGNOSIS — R042 Hemoptysis: Secondary | ICD-10-CM | POA: Insufficient documentation

## 2016-03-12 DIAGNOSIS — R9389 Abnormal findings on diagnostic imaging of other specified body structures: Secondary | ICD-10-CM

## 2016-03-12 DIAGNOSIS — C3491 Malignant neoplasm of unspecified part of right bronchus or lung: Secondary | ICD-10-CM | POA: Insufficient documentation

## 2016-03-12 DIAGNOSIS — R938 Abnormal findings on diagnostic imaging of other specified body structures: Secondary | ICD-10-CM | POA: Insufficient documentation

## 2016-03-12 DIAGNOSIS — R918 Other nonspecific abnormal finding of lung field: Secondary | ICD-10-CM | POA: Insufficient documentation

## 2016-03-12 DIAGNOSIS — I2510 Atherosclerotic heart disease of native coronary artery without angina pectoris: Secondary | ICD-10-CM | POA: Insufficient documentation

## 2016-03-12 DIAGNOSIS — I712 Thoracic aortic aneurysm, without rupture: Secondary | ICD-10-CM | POA: Insufficient documentation

## 2016-03-12 MED ORDER — IOHEXOL 300 MG/ML  SOLN
75.0000 mL | Freq: Once | INTRAMUSCULAR | Status: AC | PRN
  Administered 2016-03-12: 75 mL via INTRAVENOUS

## 2016-03-14 ENCOUNTER — Encounter (HOSPITAL_BASED_OUTPATIENT_CLINIC_OR_DEPARTMENT_OTHER): Payer: Medicare PPO | Admitting: Hematology & Oncology

## 2016-03-14 ENCOUNTER — Encounter (HOSPITAL_COMMUNITY): Payer: Self-pay | Admitting: Hematology & Oncology

## 2016-03-14 ENCOUNTER — Encounter (HOSPITAL_COMMUNITY): Payer: Medicare PPO

## 2016-03-14 VITALS — BP 124/80 | HR 102 | Temp 97.7°F | Resp 18 | Wt 139.6 lb

## 2016-03-14 DIAGNOSIS — R05 Cough: Secondary | ICD-10-CM | POA: Diagnosis not present

## 2016-03-14 DIAGNOSIS — I480 Paroxysmal atrial fibrillation: Secondary | ICD-10-CM | POA: Diagnosis not present

## 2016-03-14 DIAGNOSIS — C3491 Malignant neoplasm of unspecified part of right bronchus or lung: Secondary | ICD-10-CM

## 2016-03-14 DIAGNOSIS — Z5111 Encounter for antineoplastic chemotherapy: Secondary | ICD-10-CM | POA: Diagnosis not present

## 2016-03-14 DIAGNOSIS — Z8673 Personal history of transient ischemic attack (TIA), and cerebral infarction without residual deficits: Secondary | ICD-10-CM

## 2016-03-14 DIAGNOSIS — R058 Other specified cough: Secondary | ICD-10-CM

## 2016-03-14 DIAGNOSIS — D638 Anemia in other chronic diseases classified elsewhere: Secondary | ICD-10-CM

## 2016-03-14 DIAGNOSIS — R059 Cough, unspecified: Secondary | ICD-10-CM

## 2016-03-14 DIAGNOSIS — K5900 Constipation, unspecified: Secondary | ICD-10-CM

## 2016-03-14 LAB — CBC WITH DIFFERENTIAL/PLATELET
BASOS PCT: 0 %
Basophils Absolute: 0 10*3/uL (ref 0.0–0.1)
Eosinophils Absolute: 0.1 10*3/uL (ref 0.0–0.7)
Eosinophils Relative: 1 %
HEMATOCRIT: 27.5 % — AB (ref 39.0–52.0)
HEMOGLOBIN: 9.2 g/dL — AB (ref 13.0–17.0)
LYMPHS PCT: 6 %
Lymphs Abs: 0.4 10*3/uL — ABNORMAL LOW (ref 0.7–4.0)
MCH: 32.3 pg (ref 26.0–34.0)
MCHC: 33.5 g/dL (ref 30.0–36.0)
MCV: 96.5 fL (ref 78.0–100.0)
MONOS PCT: 7 %
Monocytes Absolute: 0.5 10*3/uL (ref 0.1–1.0)
NEUTROS ABS: 6.4 10*3/uL (ref 1.7–7.7)
NEUTROS PCT: 86 %
Platelets: 366 10*3/uL (ref 150–400)
RBC: 2.85 MIL/uL — ABNORMAL LOW (ref 4.22–5.81)
RDW: 15.3 % (ref 11.5–15.5)
WBC: 7.4 10*3/uL (ref 4.0–10.5)

## 2016-03-14 LAB — COMPREHENSIVE METABOLIC PANEL
ALBUMIN: 2.8 g/dL — AB (ref 3.5–5.0)
ALK PHOS: 63 U/L (ref 38–126)
ALT: 25 U/L (ref 17–63)
ANION GAP: 7 (ref 5–15)
AST: 26 U/L (ref 15–41)
BILIRUBIN TOTAL: 0.5 mg/dL (ref 0.3–1.2)
BUN: 9 mg/dL (ref 6–20)
CO2: 25 mmol/L (ref 22–32)
Calcium: 8.4 mg/dL — ABNORMAL LOW (ref 8.9–10.3)
Chloride: 102 mmol/L (ref 101–111)
Creatinine, Ser: 0.54 mg/dL — ABNORMAL LOW (ref 0.61–1.24)
GFR calc Af Amer: >60 mL/min (ref >60)
GFR calc non Af Amer: >60 mL/min (ref >60)
GLUCOSE: 136 mg/dL — AB (ref 65–99)
Potassium: 3.9 mmol/L (ref 3.5–5.1)
Sodium: 134 mmol/L — ABNORMAL LOW (ref 135–145)
TOTAL PROTEIN: 6.2 g/dL — AB (ref 6.5–8.1)

## 2016-03-14 LAB — IRON AND TIBC
Iron: 24 ug/dL — ABNORMAL LOW (ref 45–182)
Saturation Ratios: 11 % — ABNORMAL LOW (ref 17.9–39.5)
TIBC: 217 ug/dL — ABNORMAL LOW (ref 250–450)
UIBC: 193 ug/dL

## 2016-03-14 LAB — FERRITIN: Ferritin: 395 ng/mL — ABNORMAL HIGH (ref 24–336)

## 2016-03-14 MED ORDER — LORAZEPAM 1 MG PO TABS: 1.0000 mg | ORAL_TABLET | Freq: Three times a day (TID) | ORAL | Status: DC

## 2016-03-14 MED ORDER — LORAZEPAM 2 MG/ML IJ SOLN
0.5000 mg | INTRAMUSCULAR | Status: DC
  Administered 2016-03-14: 0.5 mg via INTRAVENOUS

## 2016-03-14 MED ORDER — SODIUM CHLORIDE 0.9 % IV SOLN
Freq: Once | INTRAVENOUS | Status: AC
  Administered 2016-03-14: 11:00:00 via INTRAVENOUS

## 2016-03-14 MED ORDER — PACLITAXEL PROTEIN-BOUND CHEMO INJECTION 100 MG
100.0000 mg/m2 | Freq: Once | INTRAVENOUS | Status: AC
  Administered 2016-03-14: 175 mg via INTRAVENOUS
  Filled 2016-03-14: qty 35

## 2016-03-14 MED ORDER — HEPARIN SOD (PORK) LOCK FLUSH 100 UNIT/ML IV SOLN
500.0000 [IU] | Freq: Once | INTRAVENOUS | Status: AC | PRN
  Administered 2016-03-14: 500 [IU]
  Filled 2016-03-14: qty 5

## 2016-03-14 MED ORDER — SODIUM CHLORIDE 0.9% FLUSH
10.0000 mL | INTRAVENOUS | Status: DC | PRN
  Administered 2016-03-14: 10 mL
  Filled 2016-03-14: qty 10

## 2016-03-14 MED ORDER — PALONOSETRON HCL INJECTION 0.25 MG/5ML
0.2500 mg | Freq: Once | INTRAVENOUS | Status: AC
  Administered 2016-03-14: 0.25 mg via INTRAVENOUS

## 2016-03-14 MED ORDER — LORAZEPAM 2 MG/ML IJ SOLN
INTRAMUSCULAR | Status: AC
  Filled 2016-03-14: qty 1

## 2016-03-14 MED ORDER — PALONOSETRON HCL INJECTION 0.25 MG/5ML
INTRAVENOUS | Status: AC
  Filled 2016-03-14: qty 5

## 2016-03-14 NOTE — Patient Instructions (Addendum)
[place] at [facility] Discharge Instructions  RECOMMENDATIONS MADE BY THE CONSULTANT AND ANY TEST RESULTS WILL BE SENT TO YOUR REFERRING PHYSICIAN.    Exam and discussion by Dr Whitney Muse today Sputum culture today if you can  Refill on ativan  Chemotherapy today if labs are stable Chemotherapy in 1 month Return to see the doctor in 1 month  Please call the clinic if you have any questions or concerns     CONSTIPATION SHEET  It is common for patients who are undergoing treatment and taking certain prescribed medications to experience side-effects with constipation.  If you experience constipation, please take stool softener such as Colace or Senna one tablet twice a day everyday to avoid constipation. You can take up to four senna twice daily if needed.  These medications are available over the counter.  Of course, if you have diarrhea, stop taking stool softeners.  Drinking plenty of fluid, eating fruits and vegetable, and being active also reduces the risk of constipation.   If despite taking stool softeners, and you still have no bowel movement for 2 days or more than your normal bowel habit frequency, please take one of the following over the counter laxatives:  MiraLax, Milk of Magnesia or Mag Citrate everyday and contact me immediately for further instructions.  The goal is to have at least one bowel movement every other day.   Sometimes patients need a combination of miralax and senna to go daily.  Please call if this does not work for you.  It is very important to NOT become constipated.  It will make you feel sick to your stomach (nausea) and can cause abdominal pain and vomiting.         Thank you for choosing [place] at [facility] to provide your oncology and hematology care.  To afford each patient quality time with our provider, please arrive at least 15 minutes before your scheduled appointment time.   Beginning  January 23rd 2017 lab work for the [facility] will be done in the  Main lab at [facility] on 1st floor. If you have a lab appointment with the [facility] please come in thru the  Main Entrance and check in at the main information desk  You need to re-schedule your appointment should you arrive 10 or more minutes late.  We strive to give you quality time with our providers, and arriving late affects you and other patients whose appointments are after yours.  Also, if you no show three or more times for appointments you may be dismissed from the clinic at the providers discretion.     Again, thank you for choosing [facility].  Our hope is that these requests will decrease the amount of time that you wait before being seen by our physicians.       _____________________________________________________________  Should you have questions after your visit to [facility], please contact our office at [phone] between the hours of 8:30 a.m. and 4:30 p.m.  Voicemails left after 4:30 p.m. will not be returned until the following business day.  For prescription refill requests, have your pharmacy contact our office.         Resources For Cancer Patients and their Caregivers ? American Cancer Society: Can assist with transportation, wigs, general needs, runs Look Good Feel Better.        ([phone] ? Cancer Care: Provides financial assistance, online support groups, medication/co-pay  assistance.  1-800-813-HOPE 9150845621) ? [place] Assists [place] cancer patients and their families through emotional , educational and financial support.  2621740429 ? [place] DSS Where to apply for food stamps, Medicaid and utility assistance. 860-565-5945 ? RCATS: Transportation to medical appointments. 610-144-2143 ? Social Security Administration: May apply for disability if have a Stage IV cancer. 6123757805  ([phone] ? [employer], Disability and Transit Services: Assists with nutrition, care and transit needs. [phone]

## 2016-03-14 NOTE — Patient Instructions (Signed)
[facility] Discharge Instructions for Patients Receiving Chemotherapy   Beginning January 23rd 2017 lab work for the [facility] will be done in the  Main lab at [facility] on 1st floor. If you have a lab appointment with the [facility] please come in thru the  Main Entrance and check in at the main information desk   Today you received the following chemotherapy agents Abraxane Day 8.  To help prevent nausea and vomiting after your treatment, we encourage you to take your nausea medication as instructed.   If you develop nausea and vomiting, or diarrhea that is not controlled by your medication, call the clinic.  The clinic phone number is [phone]. Office hours are Monday-Friday 8:30am-5:00pm.  BELOW ARE SYMPTOMS THAT SHOULD BE REPORTED IMMEDIATELY:  *FEVER GREATER THAN 101.0 F  *CHILLS WITH OR WITHOUT FEVER  NAUSEA AND VOMITING THAT IS NOT CONTROLLED WITH YOUR NAUSEA MEDICATION  *UNUSUAL SHORTNESS OF BREATH  *UNUSUAL BRUISING OR BLEEDING  TENDERNESS IN MOUTH AND THROAT WITH OR WITHOUT PRESENCE OF ULCERS  *URINARY PROBLEMS  *BOWEL PROBLEMS  UNUSUAL RASH Items with * indicate a potential emergency and should be followed up as soon as possible. If you have an emergency after office hours please contact your primary care physician or go to the nearest emergency department.  Please call the clinic during office hours if you have any questions or concerns.   You may also contact the Patient Navigator at [phone] should you have any questions or need assistance in obtaining follow up care.   Resources For Cancer Patients and their Caregivers ? American Cancer Society: Can assist with transportation, wigs, general needs, runs Look Good Feel Better.        ([phone] [phone] ? Cancer Care: Provides financial assistance, online support groups, medication/co-pay assistance.  1-800-813-HOPE (930) 345-2309) ? [facility] Assists  [place] cancer patients and their families through emotional , educational and financial support.  [phone] ? [place] DSS Where to apply for food stamps, Medicaid and utility assistance. (332)012-8593 ? RCATS: Transportation to medical appointments. 702-501-0444 ? Social Security Administration: May apply for disability if have a Stage IV cancer. [phone] 769-485-7197 ? [employer], Disability and Transit Services: Assists with nutrition, care and transit needs. [phone]

## 2016-03-14 NOTE — Progress Notes (Signed)
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 (Incompletely staged) EBUS FNA subcarinal mass, squamous cell carcinoma 01/01/2016 RT for palliation of central lung NSCLC at [place] started on 01/11/2016 Dysphagis, mild oral and mild moderate pharyngeal dysphagia Paroxysmal afib with EF 35% on ECHO 01/22/2016 Intractable hiccups on Baclofen 10 mg po tid Anemia, chronic disease Hyponatremia Acute hypoxic respiratory failure, COPD History of CVA with residual L sided weakness Urgent XRT secondary to airway compromise from NSCLC    Squamous cell lung cancer ([place])   01/01/2016 Pathology Results EBUS FNA subcarinal mass, squamous cell carcinoma   01/11/2016 -  Radiation Therapy RT for palliation of central lung NSCLC at [place] started on 01/11/2016   03/07/2016 -  Chemotherapy Carboplatin/Abraxane day 1, 8, 15 every 28 day   03/12/2016 Imaging CT chest- Pulmonary parenchymal pattern of peribronchovascular ground-glass, nodularity and consolidation is likely new or progressive from chest radiograph 02/27/2016 and may be due to an infectious bronchiolitis/bronchopneumonia.     HISTORY OF PRESENTING ILLNESS:  Tyler Aguilar 60 y.o. male is here because of clinically staged III (T1N2M0) Squamous cell carcinoma of the R lung. He completed emergent XRT at [facility] Hospital course was complicated but he has done amazingly well. He has received his first cycle of chemotherapy with carbo/abraxane  Tyler Aguilar returns to the [place] today accompanied by his sister.  Symptomatically he says "I feel like a human being." When asked if he feels good, he says "pretty much." he says he coughs up some with some dark green, a little green, and still has a cough. His sister says that he coughs a lot, but it's been  getting better.  Neither Tyler Aguilar nor his sister are sure if he is done with his current course of Levaquin antibiotics.  He says he's noticed that he's been sore in his chest, but has blamed that on his physical therapy. He also notices that, when he coughs, he gets sore.  He says that he did okay with the treatment; he just didn't like sitting there that long "because I'm fidgety."  In terms of eating, he says he isn't having any problems eating. He also denies vomiting. He says he ate a footlong sub for supper last night, and had a big lunch yesterday.  He notes that he has had problems with constipation. His sister says that they are putting benefiber in his coffee, but that doesn't seem to be helping so far.   He is able to ambulate to and from the exam table without assistance. No nausea, vomiting, fever or chills.   MEDICAL HISTORY:  Past Medical History  Diagnosis Date  . Stroke [facility])     May 2009 - R medullary CVA w L-sided weakness, sensory loss, and dysarhria/dysphagia  . COPD (chronic obstructive pulmonary disease) ([place])   . AAA (abdominal aortic aneurysm) ([facility])     4.5 cm December 2016  . Dyslipidemia   . Lung cancer ([place])     Stage III squamous cell, right lung  . Cardiomyopathy ([place])     LVEF 35% February 2017 [facility]  . PAF (paroxysmal atrial fibrillation) ([facility])     Versus SVT documented February 2017 [facility]    SURGICAL HISTORY: Past Surgical History  Procedure Laterality Date  . Finger surgery Right 1979  .  Colonoscopy  08/31/2012    Procedure: COLONOSCOPY;  Surgeon: Jamesetta So, MD;  Location: [facility] ENDO SUITE;  Service: Gastroenterology;  Laterality: N/A;  . Bronchoscopy    . Portacath placement Left 02/27/2016    Procedure: INSERTION PORT-A-CATH LEFT SUBCLAVIAN;  Surgeon: Aviva Signs, MD;  Location: [facility] ORS;  Service: General;  Laterality: Left;    SOCIAL HISTORY: Social History   Social History  . Marital Status: Married    Spouse Name: N/A  .  Number of Children: N/A  . Years of Education: N/A   Occupational History  . Not on file.   Social History Main Topics  . Smoking status: Former Smoker    Types: Cigarettes    Quit date: 12/31/2015  . Smokeless tobacco: Not on file  . Alcohol Use: 0.0 oz/week    0 Standard drinks or equivalent per week  . Drug Use: No  . Sexual Activity: Not on file   Other Topics Concern  . Not on file   Social History Narrative  Currently going through a divorce 0 children Ex smoker. Previously worked as a Dealer.  FAMILY HISTORY: Family History  Problem Relation Age of Onset  . Diabetes Mellitus II Mother   . Cancer Mother   . Cancer Father   . Coronary artery disease Brother     CABG   has no family status information on file.   Father died of a hematoma of the liver at 39 yo Mother died of heart failure at 19 yo  ALLERGIES:  has No Known Allergies.  MEDICATIONS:  Current Outpatient Prescriptions  Medication Sig Dispense Refill  . albuterol (PROVENTIL HFA;VENTOLIN HFA) 108 (90 Base) MCG/ACT inhaler Inhale 2 puffs into the lungs every 4 (four) hours as needed for wheezing or shortness of breath.    Marland Kitchen aspirin 325 MG tablet Take 1 tablet (325 mg total) by mouth daily. 90 tablet 2  . atorvastatin (LIPITOR) 80 MG tablet Take 80 mg by mouth every evening.    . baclofen (LIORESAL) 10 MG tablet Take 1 tablet (10 mg total) by mouth 3 (three) times daily as needed (hiccups). 60 each 0  . budesonide-formoterol (SYMBICORT) 160-4.5 MCG/ACT inhaler Inhale 2 puffs into the lungs 2 (two) times daily.    Marland Kitchen CARBOPLATIN IV Inject into the vein. To start 3/24    . carvedilol (COREG) 3.125 MG tablet Take 1 tablet (3.125 mg total) by mouth 2 (two) times daily. 180 tablet 3  . levofloxacin (LEVAQUIN) 500 MG tablet Take 1 tablet (500 mg total) by mouth daily. 10 tablet 0  . lidocaine-prilocaine (EMLA) cream Apply a quarter size amount to port site 1 hour prior to chemo. Do not rub in. Cover with  plastic wrap. 30 g 3  . LORazepam (ATIVAN) 1 MG tablet Take 1 tablet (1 mg total) by mouth 3 (three) times daily. 30 tablet 0  . magic mouthwash w/lidocaine SOLN Take 30 mLs by mouth.    . ondansetron (ZOFRAN) 8 MG tablet Take 8 mg by mouth every 8 (eight) hours as needed for nausea or vomiting.    Marland Kitchen oxyCODONE (ROXICODONE) 5 MG immediate release tablet Take 5 mg by mouth.    Marland Kitchen PACLitaxel Protein-Bound Part (ABRAXANE IV) Inject into the vein. To start 3/24.    Marland Kitchen pantoprazole (PROTONIX) 40 MG tablet Take 1 tablet (40 mg total) by mouth daily. 90 tablet 2  . pyridOXINE (VITAMIN B-6) 50 MG tablet Take 50 mg by mouth daily.    . ranitidine (  ZANTAC) 150 MG tablet Take 1 tablet (150 mg total) by mouth 2 (two) times daily. 180 tablet 2  . rivaroxaban (XARELTO) 20 MG TABS tablet Take 1 tablet (20 mg total) by mouth daily. 90 tablet 2  . sodium chloride (OCEAN) 0.65 % nasal spray Place into the nose.    . tiotropium (SPIRIVA) 18 MCG inhalation capsule Place 18 mcg into inhaler and inhale every evening.     No current facility-administered medications for this visit.    Review of Systems  Constitutional: Negative for weight loss.  Doing much better overall. HENT: Positive for congestion.   Eyes: Negative.   Respiratory: Positive for cough, sputum production, improved   Cardiovascular: Negative.   Gastrointestinal: Negative.  Negative for diarrhea.  Genitourinary: Negative.   Musculoskeletal: Negative for falls.      History of falls secondary to leg weakness.  Skin: Negative.   Neurological: Positive for tremors and weakness.       Tremor managed with propranolol. Left leg weakness (chronic)  Endo/Heme/Allergies: Negative.   Psychiatric/Behavioral: Currently denies All other systems reviewed and are negative.  14 point ROS was done and is otherwise as detailed above or in HPI   PHYSICAL EXAMINATION: ECOG PERFORMANCE STATUS: 1 - Symptomatic but completely ambulatory  Filed Vitals:    03/14/16 0900  BP: 127/71  Pulse: 76  Temp: 98.7 F (37.1 C)  Resp: 18   Filed Weights   03/14/16 0900  Weight: 139 lb 9.6 oz (63.322 kg)    Physical Exam  Constitutional: He is oriented to person, place, and time and well-developed, well-nourished, and in no distress. Well groomed Ambulated to the clinic today, without a cane. HENT:  Head: Normocephalic and atraumatic.  Nose: Nose normal.  Mouth/Throat: Oropharynx is clear and moist. No oropharyngeal exudate.  Eyes: Conjunctivae and EOM are normal. Pupils are equal, round, and reactive to light. Right eye exhibits no discharge. Left eye exhibits no discharge. No scleral icterus.  Neck: Normal range of motion. Neck supple. No tracheal deviation present. No thyromegaly present.  Cardiovascular: Normal rate, regular rhythm and normal heart sounds.  Exam reveals no gallop and no friction rub.   No murmur heard. Pulmonary/Chest: Effort normal. He has no wheezes. He has no rales.  Clear with good breath sounds.  Abdominal: Soft. Bowel sounds are normal. He exhibits no distension and no mass. There is no tenderness. There is no rebound and no guarding.  Musculoskeletal: Normal range of motion. He exhibits no edema.  Lymphadenopathy:    He has no cervical adenopathy.  Neurological: He is alert and oriented to person, place, and time. No cranial nerve deficit.  Skin: Skin is warm and dry. No rash noted.  Psychiatric: Mood, memory, affect and judgment normal.  Nursing note and vitals reviewed.  LABORATORY DATA:  I have reviewed the data as listed     RADIOGRAPHIC STUDIES: I have personally reviewed the radiological images as listed and agreed with the findings in the report.   Study Result     CLINICAL DATA: Abnormal chest radiograph 03/07/2016, stage III lung cancer, hemoptysis, shortness of breath.  EXAM: CT CHEST WITH CONTRAST  TECHNIQUE: Multidetector CT imaging of the chest was performed during intravenous  contrast administration.  CONTRAST: 1m OMNIPAQUE IOHEXOL 300 MG/ML SOLN  COMPARISON: Chest radiograph 03/07/2016 and CT chest 01/03/2016.  FINDINGS: Mediastinum/Lymph Nodes: Subcarinal nodal mass measures 2.5 x 3.1 cm, previously 4.6 x 4.7 cm. No additional pathologically enlarged mediastinal or hilar lymph nodes. Left subclavian Port-A-Cath  terminates in the SVC. No axillary adenopathy. Atherosclerotic calcification of the arterial vasculature. Ascending aorta measures up to 4.2 cm. Three-vessel coronary artery calcification. Heart size normal. No pericardial effusion. Small amount of fluid is seen in the esophagus.  Lungs/Pleura: Mild centrilobular emphysema. There is new peribronchovascular ground-glass, nodularity and consolidation bilaterally when compared with 01/03/2016. When compared with chest radiograph 02/27/2016, findings appear new or progressive. There is some associated septal thickening. No pleural fluid. Airway is unremarkable.  Upper abdomen: Visualized portions of the liver, adrenal glands, kidneys, spleen, pancreas and stomach are grossly unremarkable.  Musculoskeletal: No worrisome lytic or sclerotic lesions.  IMPRESSION: 1. Pulmonary parenchymal pattern of peribronchovascular ground-glass, nodularity and consolidation is likely new or progressive from chest radiograph 02/27/2016 and may be due to an infectious bronchiolitis/bronchopneumonia. However, basilar predominance and areas of septal thickening raise the question of lymphangitic carcinomatosis. 2. Decrease in size of subcarinal nodal mass. 3. Ascending aortic aneurysm. 4. Three-vessel coronary artery calcification.   Electronically Signed  By: Lorin Picket M.D.  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 CT of the chest was reviewed with the patient and his sister. I have recommended a sputum culture today. In addition he is to complete his abx. He remains afebrile, normal WBC, he has weight gain, is active, I suspect findings are disease related.  He was given a constipation sheet. I stressed the importance of preventing constipation. We will continue to proceed with therapy as planned.   Orders Placed This Encounter  Procedures  . Sputum culture    Standing Status: Future     Number of Occurrences: 1     Standing Expiration Date: 03/14/2017   All questions were answered. The patient knows to call the clinic with any problems, questions or concerns.  This document serves as a record of services personally performed by Ancil Linsey, MD. It was created on her behalf by Toni Amend, a trained medical scribe. The creation of this record is based on the scribe's personal observations and the provider's statements to them. This document has been checked and approved by the attending provider.  I have reviewed the above documentation for accuracy and completeness, and I agree with the above.  This note was electronically signed.    Tyler Hazard, MD  03/14/2016 9:52 AM

## 2016-03-21 ENCOUNTER — Encounter (HOSPITAL_COMMUNITY): Payer: Medicare PPO | Attending: Hematology & Oncology

## 2016-03-21 VITALS — BP 108/75 | HR 90 | Temp 98.2°F | Resp 18 | Wt 144.8 lb

## 2016-03-21 DIAGNOSIS — R042 Hemoptysis: Secondary | ICD-10-CM | POA: Diagnosis present

## 2016-03-21 DIAGNOSIS — R05 Cough: Secondary | ICD-10-CM | POA: Diagnosis present

## 2016-03-21 DIAGNOSIS — C3491 Malignant neoplasm of unspecified part of right bronchus or lung: Secondary | ICD-10-CM | POA: Insufficient documentation

## 2016-03-21 DIAGNOSIS — Z5111 Encounter for antineoplastic chemotherapy: Secondary | ICD-10-CM

## 2016-03-21 DIAGNOSIS — R938 Abnormal findings on diagnostic imaging of other specified body structures: Secondary | ICD-10-CM | POA: Insufficient documentation

## 2016-03-21 LAB — COMPREHENSIVE METABOLIC PANEL
ALBUMIN: 2.9 g/dL — AB (ref 3.5–5.0)
ALT: 22 U/L (ref 17–63)
AST: 27 U/L (ref 15–41)
Alkaline Phosphatase: 56 U/L (ref 38–126)
Anion gap: 10 (ref 5–15)
BUN: 8 mg/dL (ref 6–20)
CHLORIDE: 105 mmol/L (ref 101–111)
CO2: 24 mmol/L (ref 22–32)
CREATININE: 0.51 mg/dL — AB (ref 0.61–1.24)
Calcium: 8.4 mg/dL — ABNORMAL LOW (ref 8.9–10.3)
GFR calc Af Amer: >60 mL/min (ref >60)
GFR calc non Af Amer: >60 mL/min (ref >60)
GLUCOSE: 122 mg/dL — AB (ref 65–99)
POTASSIUM: 3.8 mmol/L (ref 3.5–5.1)
Sodium: 139 mmol/L (ref 135–145)
Total Bilirubin: 0.6 mg/dL (ref 0.3–1.2)
Total Protein: 6.2 g/dL — ABNORMAL LOW (ref 6.5–8.1)

## 2016-03-21 LAB — CBC WITH DIFFERENTIAL/PLATELET
BASOS ABS: 0 10*3/uL (ref 0.0–0.1)
BASOS PCT: 1 %
EOS PCT: 1 %
Eosinophils Absolute: 0 10*3/uL (ref 0.0–0.7)
HEMATOCRIT: 26.7 % — AB (ref 39.0–52.0)
Hemoglobin: 9 g/dL — ABNORMAL LOW (ref 13.0–17.0)
LYMPHS PCT: 13 %
Lymphs Abs: 0.5 10*3/uL — ABNORMAL LOW (ref 0.7–4.0)
MCH: 33.1 pg (ref 26.0–34.0)
MCHC: 33.7 g/dL (ref 30.0–36.0)
MCV: 98.2 fL (ref 78.0–100.0)
MONO ABS: 0.5 10*3/uL (ref 0.1–1.0)
Monocytes Relative: 12 %
NEUTROS ABS: 3 10*3/uL (ref 1.7–7.7)
Neutrophils Relative %: 73 %
PLATELETS: 292 10*3/uL (ref 150–400)
RBC: 2.72 MIL/uL — AB (ref 4.22–5.81)
RDW: 16.4 % — AB (ref 11.5–15.5)
WBC: 4.1 10*3/uL (ref 4.0–10.5)

## 2016-03-21 MED ORDER — LORAZEPAM 2 MG/ML IJ SOLN
0.5000 mg | INTRAMUSCULAR | Status: DC
  Filled 2016-03-21: qty 1

## 2016-03-21 MED ORDER — PALONOSETRON HCL INJECTION 0.25 MG/5ML
0.2500 mg | Freq: Once | INTRAVENOUS | Status: AC
  Administered 2016-03-21: 0.25 mg via INTRAVENOUS
  Filled 2016-03-21: qty 5

## 2016-03-21 MED ORDER — PACLITAXEL PROTEIN-BOUND CHEMO INJECTION 100 MG
100.0000 mg/m2 | Freq: Once | INTRAVENOUS | Status: AC
  Administered 2016-03-21: 175 mg via INTRAVENOUS
  Filled 2016-03-21: qty 35

## 2016-03-21 MED ORDER — LORAZEPAM 2 MG/ML IJ SOLN
0.5000 mg | Freq: Once | INTRAMUSCULAR | Status: DC
  Administered 2016-03-21: 0.5 mg via INTRAVENOUS

## 2016-03-21 MED ORDER — HEPARIN SOD (PORK) LOCK FLUSH 100 UNIT/ML IV SOLN
500.0000 [IU] | Freq: Once | INTRAVENOUS | Status: AC | PRN
  Administered 2016-03-21: 500 [IU]
  Filled 2016-03-21: qty 5

## 2016-03-21 MED ORDER — SODIUM CHLORIDE 0.9 % IV SOLN
Freq: Once | INTRAVENOUS | Status: AC
  Administered 2016-03-21: 11:00:00 via INTRAVENOUS

## 2016-03-21 MED ORDER — SODIUM CHLORIDE 0.9% FLUSH
10.0000 mL | INTRAVENOUS | Status: DC | PRN
  Administered 2016-03-21: 10 mL
  Filled 2016-03-21: qty 10

## 2016-03-21 NOTE — Progress Notes (Signed)
Patient tolerated infusion well.  VSS.   

## 2016-03-21 NOTE — Patient Instructions (Signed)
[facility] Discharge Instructions for Patients Receiving Chemotherapy   Beginning January 23rd 2017 lab work for the [facility] will be done in the  Main lab at [facility] on 1st floor. If you have a lab appointment with the [facility] please come in thru the  Main Entrance and check in at the main information desk   Today you received the following chemotherapy agent: Abraxane.     If you develop nausea and vomiting, or diarrhea that is not controlled by your medication, call the clinic.  The clinic phone number is [phone]. Office hours are Monday-Friday 8:30am-5:00pm.  BELOW ARE SYMPTOMS THAT SHOULD BE REPORTED IMMEDIATELY:  *FEVER GREATER THAN 101.0 F  *CHILLS WITH OR WITHOUT FEVER  NAUSEA AND VOMITING THAT IS NOT CONTROLLED WITH YOUR NAUSEA MEDICATION  *UNUSUAL SHORTNESS OF BREATH  *UNUSUAL BRUISING OR BLEEDING  TENDERNESS IN MOUTH AND THROAT WITH OR WITHOUT PRESENCE OF ULCERS  *URINARY PROBLEMS  *BOWEL PROBLEMS  UNUSUAL RASH Items with * indicate a potential emergency and should be followed up as soon as possible. If you have an emergency after office hours please contact your primary care physician or go to the nearest emergency department.  Please call the clinic during office hours if you have any questions or concerns.   You may also contact the Patient Navigator at [phone] should you have any questions or need assistance in obtaining follow up care.      Resources For Cancer Patients and their Caregivers ? American Cancer Society: Can assist with transportation, wigs, general needs, runs Look Good Feel Better.        304-597-1109 ? Cancer Care: Provides financial assistance, online support groups, medication/co-pay assistance.  1-800-813-HOPE 506 287 9645) ? [place] Assists [place] cancer patients and their families through emotional , educational and financial support.   2018552069 ? [place] DSS Where to apply for food stamps, Medicaid and utility assistance. 403 205 1667 ? [facility]: Transportation to medical appointments. (517)482-7365 ? Social Security Administration: May apply for disability if have a Stage IV cancer. (865)552-0101 647-747-3104 ? [employer], Disability and Transit Services: Assists with nutrition, care and transit needs. ([phone] [phone]

## 2016-04-02 ENCOUNTER — Other Ambulatory Visit (HOSPITAL_COMMUNITY): Payer: Self-pay | Admitting: *Deleted

## 2016-04-02 DIAGNOSIS — J449 Chronic obstructive pulmonary disease, unspecified: Secondary | ICD-10-CM

## 2016-04-02 MED ORDER — TIOTROPIUM BROMIDE MONOHYDRATE 2.5 MCG/ACT IN AERS: 2.0000 | INHALATION_SPRAY | Freq: Every day | RESPIRATORY_TRACT | Status: DC

## 2016-04-02 MED ORDER — TIOTROPIUM BROMIDE MONOHYDRATE 18 MCG IN CAPS: 18.0000 ug | ORAL_CAPSULE | Freq: Every evening | RESPIRATORY_TRACT | Status: DC

## 2016-04-04 ENCOUNTER — Encounter (HOSPITAL_BASED_OUTPATIENT_CLINIC_OR_DEPARTMENT_OTHER): Payer: Medicare PPO

## 2016-04-04 ENCOUNTER — Encounter (HOSPITAL_BASED_OUTPATIENT_CLINIC_OR_DEPARTMENT_OTHER): Payer: Medicare PPO | Admitting: Hematology & Oncology

## 2016-04-04 ENCOUNTER — Encounter (HOSPITAL_COMMUNITY): Payer: Self-pay | Admitting: Hematology & Oncology

## 2016-04-04 VITALS — BP 112/68 | HR 82 | Temp 97.1°F | Resp 16

## 2016-04-04 VITALS — BP 107/72 | HR 96 | Temp 98.6°F | Resp 18 | Wt 143.0 lb

## 2016-04-04 DIAGNOSIS — I480 Paroxysmal atrial fibrillation: Secondary | ICD-10-CM

## 2016-04-04 DIAGNOSIS — D638 Anemia in other chronic diseases classified elsewhere: Secondary | ICD-10-CM

## 2016-04-04 DIAGNOSIS — C3491 Malignant neoplasm of unspecified part of right bronchus or lung: Secondary | ICD-10-CM | POA: Diagnosis not present

## 2016-04-04 DIAGNOSIS — R05 Cough: Secondary | ICD-10-CM

## 2016-04-04 DIAGNOSIS — J449 Chronic obstructive pulmonary disease, unspecified: Secondary | ICD-10-CM | POA: Diagnosis not present

## 2016-04-04 DIAGNOSIS — Z5111 Encounter for antineoplastic chemotherapy: Secondary | ICD-10-CM

## 2016-04-04 DIAGNOSIS — Z8673 Personal history of transient ischemic attack (TIA), and cerebral infarction without residual deficits: Secondary | ICD-10-CM

## 2016-04-04 DIAGNOSIS — R059 Cough, unspecified: Secondary | ICD-10-CM

## 2016-04-04 DIAGNOSIS — E871 Hypo-osmolality and hyponatremia: Secondary | ICD-10-CM

## 2016-04-04 LAB — CBC WITH DIFFERENTIAL/PLATELET
Basophils Absolute: 0 10*3/uL (ref 0.0–0.1)
Basophils Relative: 0 %
EOS PCT: 0 %
Eosinophils Absolute: 0 10*3/uL (ref 0.0–0.7)
HCT: 27.4 % — ABNORMAL LOW (ref 39.0–52.0)
Hemoglobin: 9.1 g/dL — ABNORMAL LOW (ref 13.0–17.0)
LYMPHS ABS: 0.5 10*3/uL — AB (ref 0.7–4.0)
LYMPHS PCT: 8 %
MCH: 32.6 pg (ref 26.0–34.0)
MCHC: 33.2 g/dL (ref 30.0–36.0)
MCV: 98.2 fL (ref 78.0–100.0)
MONO ABS: 1 10*3/uL (ref 0.1–1.0)
Monocytes Relative: 14 %
Neutro Abs: 5.2 10*3/uL (ref 1.7–7.7)
Neutrophils Relative %: 78 %
PLATELETS: 272 10*3/uL (ref 150–400)
RBC: 2.79 MIL/uL — ABNORMAL LOW (ref 4.22–5.81)
RDW: 17.7 % — AB (ref 11.5–15.5)
WBC: 6.7 10*3/uL (ref 4.0–10.5)

## 2016-04-04 LAB — COMPREHENSIVE METABOLIC PANEL
ALT: 20 U/L (ref 17–63)
ANION GAP: 9 (ref 5–15)
AST: 25 U/L (ref 15–41)
Albumin: 3 g/dL — ABNORMAL LOW (ref 3.5–5.0)
Alkaline Phosphatase: 56 U/L (ref 38–126)
BUN: 10 mg/dL (ref 6–20)
CHLORIDE: 102 mmol/L (ref 101–111)
CO2: 24 mmol/L (ref 22–32)
Calcium: 8.9 mg/dL (ref 8.9–10.3)
Creatinine, Ser: 0.61 mg/dL (ref 0.61–1.24)
Glucose, Bld: 135 mg/dL — ABNORMAL HIGH (ref 65–99)
POTASSIUM: 4 mmol/L (ref 3.5–5.1)
Sodium: 135 mmol/L (ref 135–145)
Total Bilirubin: 0.5 mg/dL (ref 0.3–1.2)
Total Protein: 6.8 g/dL (ref 6.5–8.1)

## 2016-04-04 MED ORDER — SODIUM CHLORIDE 0.9% FLUSH: 10.0000 mL | INTRAVENOUS | Status: DC | PRN

## 2016-04-04 MED ORDER — SODIUM CHLORIDE 0.9 % IV SOLN
Freq: Once | INTRAVENOUS | Status: AC
  Administered 2016-04-04: 11:00:00 via INTRAVENOUS

## 2016-04-04 MED ORDER — LORAZEPAM 2 MG/ML IJ SOLN
0.5000 mg | Freq: Once | INTRAMUSCULAR | Status: AC
  Administered 2016-04-04: 0.5 mg via INTRAVENOUS

## 2016-04-04 MED ORDER — PALONOSETRON HCL INJECTION 0.25 MG/5ML
INTRAVENOUS | Status: AC
  Filled 2016-04-04: qty 5

## 2016-04-04 MED ORDER — PACLITAXEL PROTEIN-BOUND CHEMO INJECTION 100 MG
100.0000 mg/m2 | Freq: Once | INTRAVENOUS | Status: AC
  Administered 2016-04-04: 175 mg via INTRAVENOUS
  Filled 2016-04-04: qty 35

## 2016-04-04 MED ORDER — CARBOPLATIN CHEMO INJECTION 600 MG/60ML
660.0000 mg | Freq: Once | INTRAVENOUS | Status: AC
  Administered 2016-04-04: 660 mg via INTRAVENOUS
  Filled 2016-04-04: qty 66

## 2016-04-04 MED ORDER — SODIUM CHLORIDE 0.9 % IV SOLN
10.0000 mg | Freq: Once | INTRAVENOUS | Status: AC
  Administered 2016-04-04: 10 mg via INTRAVENOUS
  Filled 2016-04-04: qty 1

## 2016-04-04 MED ORDER — HEPARIN SOD (PORK) LOCK FLUSH 100 UNIT/ML IV SOLN
500.0000 [IU] | Freq: Once | INTRAVENOUS | Status: AC | PRN
  Administered 2016-04-04: 500 [IU]
  Filled 2016-04-04: qty 5

## 2016-04-04 MED ORDER — FEXOFENADINE HCL 180 MG PO TABS: 180.0000 mg | ORAL_TABLET | Freq: Every day | ORAL | Status: DC

## 2016-04-04 MED ORDER — LORAZEPAM 2 MG/ML IJ SOLN: 0.5000 mg | Freq: Once | INTRAMUSCULAR | Status: DC | PRN

## 2016-04-04 MED ORDER — PALONOSETRON HCL INJECTION 0.25 MG/5ML
0.2500 mg | Freq: Once | INTRAVENOUS | Status: AC
  Administered 2016-04-04: 0.25 mg via INTRAVENOUS

## 2016-04-04 MED ORDER — LORAZEPAM 2 MG/ML IJ SOLN
INTRAMUSCULAR | Status: AC
  Filled 2016-04-04: qty 1

## 2016-04-04 NOTE — Progress Notes (Signed)
Patient tolerated infusion well.  VSS.   

## 2016-04-04 NOTE — Progress Notes (Signed)
[facility] at 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 male is here because of clinically staged III (T1N2M0) Squamous cell carcinoma of the R lung.He is here for ongoing chemotherapy.   Tyler Aguilar returns to the [facility] today accompanied by his sister.  He notes that he's eating okay. When asked how he's doing with the chemo, he says he's doing okay; "a little sore, but other than that." He notes that his head is a little sore, and his "hair is beginning to come out." He notes that his right ear is bothering him, "popping and cracking," since a couple days ago. He comments that he had a lot of problems with his ears when he was younger,  He notes that the glands in his neck "feel touchy." He was on allergy medication before, which his sister confirms. He's not on the allergy medicine anymore, and mowed grass this past Monday. His sister believes that this is the reason for his recent symptoms.  He notes that his breathing is "so-so." He says that he feels like he used to be able to get a deeper breath, but now sometimes he can't get "the deep breath he wants." He notes that he "can regroup and get it."  In terms of cough, he says "I cough and nothing comes out." He says  it's "like a dry cough." His sister says it doesn't sound dry. He notes that, once in a while, something will come up, but on average it's a "dry cough like." He denies hemoptysis.   He confirms that he's staying moving, he's been out shopping, out in the yard. He says he's been bagging grass and dumping it after mowing. He confirms that physically, he's doing well, and has been discharged by physical therapy.    MEDICAL HISTORY:  Past Medical History  Diagnosis Date  . Stroke [facility])     May 2009 - R medullary CVA w L-sided weakness, sensory loss, and dysarhria/dysphagia  . COPD (chronic obstructive pulmonary disease) ([place])   . AAA (abdominal aortic aneurysm) (HCC)     4.5 cm December 2016  . Dyslipidemia   . Lung cancer ([place])     Stage III squamous cell, right lung  . Cardiomyopathy ([place])     LVEF 35% February 2017 [facility]  . PAF (paroxysmal atrial fibrillation) (HCC)     Versus SVT documented February 2017 [facility]    SURGICAL HISTORY: Past Surgical History  Procedure Laterality Date  . Finger surgery Right 1979  . Colonoscopy  08/31/2012    Procedure: COLONOSCOPY;  Surgeon: Jamesetta So, MD;  Location: [facility] END[facility] SUITE;  Service: Gastroenterology;  Laterality: N/A;  . Bronchoscopy    . Portacath placement Left 02/27/2016    Procedure: INSERTION PORT-A-CATH LEFT SUBCLAVIAN;  Surgeon: Aviva Signs, MD;  Location: [facility] ORS;  Service: General;  Laterality:  Left;    SOCIAL HISTORY: Social History   Social History  . Marital Status: Married    Spouse Name: N/A  . Number of Children: N/A  . Years of Education: N/A   Occupational History  . Not on file.   Social History Main Topics  . Smoking status: Former Smoker    Types: Cigarettes    Quit date: 12/31/2015  . Smokeless tobacco: Not on file  . Alcohol Use: 0.0 oz/week    0 Standard drinks or equivalent per week  . Drug Use: No  . Sexual Activity: Not on file   Other Topics Concern  . Not on file   Social History  Narrative  Currently going through a divorce 0 children Ex smoker. Previously worked as a Dealer.  FAMILY HISTORY: Family History  Problem Relation Age of Onset  . Diabetes Mellitus II Mother   . Cancer Mother   . Cancer Father   . Coronary artery disease Brother     CABG   has no family status information on file.   Father died of a hematoma of the liver at 60 yo Mother died of heart failure at 22 yo  ALLERGIES:  has No Known Allergies.  MEDICATIONS:  Current Outpatient Prescriptions  Medication Sig Dispense Refill  . albuterol (PROVENTIL HFA;VENTOLIN HFA) 108 (90 Base) MCG/ACT inhaler Inhale 2 puffs into the lungs every 4 (four) hours as needed for wheezing or shortness of breath.    Marland Kitchen aspirin 325 MG tablet Take 1 tablet (325 mg total) by mouth daily. 90 tablet 2  . atorvastatin (LIPITOR) 80 MG tablet Take 80 mg by mouth every evening.    . baclofen (LIORESAL) 10 MG tablet Take 1 tablet (10 mg total) by mouth 3 (three) times daily as needed (hiccups). 60 each 0  . budesonide-formoterol (SYMBICORT) 160-4.5 MCG/ACT inhaler Inhale 2 puffs into the lungs 2 (two) times daily.    Marland Kitchen CARAFATE 1 GM/10ML suspension     . CARBOPLATIN IV Inject into the vein. To start 3/24    . carvedilol (COREG) 3.125 MG tablet Take 1 tablet (3.125 mg total) by mouth 2 (two) times daily. 180 tablet 3  . lidocaine-prilocaine (EMLA) cream Apply a quarter size amount to port site 1 hour prior to chemo. Do not rub in. Cover with plastic wrap. 30 g 3  . LORazepam (ATIVAN) 1 MG tablet Take 1 tablet (1 mg total) by mouth 3 (three) times daily. 30 tablet 0  . magic mouthwash w/lidocaine SOLN Take 30 mLs by mouth.    . ondansetron (ZOFRAN) 8 MG tablet Take 8 mg by mouth every 8 (eight) hours as needed for nausea or vomiting.    Marland Kitchen oxyCODONE (ROXICODONE) 5 MG immediate release tablet Take 5 mg by mouth.    Marland Kitchen PACLitaxel Protein-Bound Part (ABRAXANE IV) Inject into the vein. To start 3/24.    Marland Kitchen pantoprazole  (PROTONIX) 40 MG tablet Take 1 tablet (40 mg total) by mouth daily. 90 tablet 2  . pyridOXINE (VITAMIN B-6) 50 MG tablet Take 50 mg by mouth daily.    . ranitidine (ZANTAC) 150 MG tablet Take 1 tablet (150 mg total) by mouth 2 (two) times daily. 180 tablet 2  . rivaroxaban (XARELTO) 20 MG TABS tablet Take 1 tablet (20 mg total) by mouth daily. 90 tablet 2  . sodium chloride (OCEAN) 0.65 % nasal spray Place into the nose.    Marland Kitchen Spacer/Aero-Holding Chambers (AEROCHAMBER PLUS FLO-VU) MISC     . Tiotropium  Bromide Monohydrate (SPIRIVA RESPIMAT) 2.5 MCG/ACT AERS Inhale 2 puffs into the lungs daily. 4 g 0  . fexofenadine (ALLEGRA ALLERGY) 180 MG tablet Take 1 tablet (180 mg total) by mouth daily. 30 tablet 3   No current facility-administered medications for this visit.    Review of Systems  Constitutional: Negative for weight loss.  Doing much better overall. HENT: Positive for congestion.   Eyes: Negative.   Respiratory: Positive for cough, sputum production, improved   Cardiovascular: Negative.   Gastrointestinal: Negative.  Negative for diarrhea.  Genitourinary: Negative.   Musculoskeletal: Negative for falls. Skin: Negative.   Neurological: Positive for tremors and weakness.       Tremor managed with propranolol. Left leg weakness.  Endo/Heme/Allergies: Negative.   Psychiatric/Behavioral: Currently denies All other systems reviewed and are negative.  14 point ROS was done and is otherwise as detailed above or in HPI  PHYSICAL EXAMINATION: ECOG PERFORMANCE STATUS: 1 - Symptomatic but completely ambulatory  Filed Vitals:   04/04/16 1000  BP: 107/72  Pulse: 96  Temp: 98.6 F (37 C)  Resp: 18   Filed Weights   04/04/16 1000  Weight: 143 lb (64.864 kg)    Physical Exam  Constitutional: He is oriented to person, place, and time and well-developed, well-nourished, and in no distress. Well groomed Ambulated to the clinic today, without a cane. HENT:  TM's visualized  bilaterally and dull Head: Normocephalic and atraumatic.  Nose: Nose normal.  Mouth/Throat: Oropharynx is clear and moist. No oropharyngeal exudate.  Eyes: Conjunctivae and EOM are normal. Pupils are equal, round, and reactive to light. Right eye exhibits no discharge. Left eye exhibits no discharge. No scleral icterus.  Neck: Normal range of motion. Neck supple. No tracheal deviation present. No thyromegaly present.  Cardiovascular: Normal rate, regular rhythm and normal heart sounds.  Exam reveals no gallop and no friction rub.   No murmur heard. Pulmonary/Chest: Effort normal. He has no wheezes. He has no rales.  Occasional "crackle" at the LL base Abdominal: Soft. Bowel sounds are normal. He exhibits no distension and no mass. There is no tenderness. There is no rebound and no guarding.  Musculoskeletal: Normal range of motion. He exhibits no edema.  Lymphadenopathy:    He has no cervical adenopathy.  Neurological: He is alert and oriented to person, place, and time. No cranial nerve deficit.  Skin: Skin is warm and dry. No rash noted.  Psychiatric: Mood, memory, affect and judgment normal.  Nursing note and vitals reviewed.  LABORATORY DATA:  I have reviewed the data as listed  CBC    Component Value Date/Time   WBC 6.7 04/04/2016 1000   RBC 2.79* 04/04/2016 1000   HGB 9.1* 04/04/2016 1000   HCT 27.4* 04/04/2016 1000   PLT 272 04/04/2016 1000   MCV 98.2 04/04/2016 1000   MCH 32.6 04/04/2016 1000   MCHC 33.2 04/04/2016 1000   RDW 17.7* 04/04/2016 1000   LYMPHSABS 0.5* 04/04/2016 1000   MONOABS 1.0 04/04/2016 1000   EOSABS 0.0 04/04/2016 1000   BASOSABS 0.0 04/04/2016 1000   CMP     Component Value Date/Time   NA 139 03/21/2016 0955   K 3.8 03/21/2016 0955   CL 105 03/21/2016 0955   CO2 24 03/21/2016 0955   GLUCOSE 122* 03/21/2016 0955   BUN 8 03/21/2016 0955   CREATININE 0.51* 03/21/2016 0955   CALCIUM 8.4* 03/21/2016 0955   PROT 6.2* 03/21/2016 0955   ALBUMIN  2.9* 03/21/2016 0955   AST  27 03/21/2016 0955   ALT 22 03/21/2016 0955   ALKPHOS 56 03/21/2016 0955   BILITOT 0.6 03/21/2016 0955   GFRNONAA >60 03/21/2016 0955   GFRAA >60 03/21/2016 0955    RADIOGRAPHIC STUDIES: I have personally reviewed the radiological images as listed and agreed with the findings in the report.  Result Impression   1.  Interval increase in interstitial and airspace opacities in the left lung base concerning for pneumonitis/pneumonia.  2. Subcarinal and hilar lymphadenopathy.   Result Narrative  XR CHEST PA AND LATERAL, 02/13/2016 12:42 PM  INDICATION: cough/dyspneaR05 Cough  COMPARISON: Multiple priors, including most recent chest radiograph dated 01/20/2016  FINDINGS:   Cardiovascular: Similar cardiopericardial silhouette. Tortuous and atherosclerotic thoracic aorta.     Mediastinum: Subcarinal and hilar lymphadenopathy.     Lungs/pleura: Interval increase in interstitial and airspace opacities in the left lung base.     Upper abdomen: Visualized portions are unremarkable.  Chest wall/osseous structures: Unremarkable.     Study Result     CLINICAL DATA: Sudden onset intermittent worsening shortness of breath beginning this afternoon. Status post bronchoscopy yesterday. History of COPD and stroke.  EXAM: CT ANGIOGRAPHY CHEST WITH CONTRAST  TECHNIQUE: Multidetector CT imaging of the chest was performed using the standard protocol during bolus administration of intravenous contrast. Multiplanar CT image reconstructions and MIPs were obtained to evaluate the vascular anatomy.  CONTRAST: 153m OMNIPAQUE IOHEXOL 350 MG/ML SOLN  COMPARISON: Chest radiograph January 02, 2016  FINDINGS: PULMONARY ARTERY: Inadequate pulmonary arterial contrast opacification, 140 Hounsfield units, target is 250 Hounsfield units. Main pulmonary artery is not enlarged. No central pulmonary arterial filling defects.  MEDIASTINUM: Hypo enhancing 4.6 x 4.7 x 7.3  cm (AP by transverse by CC) sub carinal mass, moderately narrowing the bilateral mainstem bronchi which remains patent. Soft tissue effaces multiple lower lobe segmental bronchi, however patent subsegmental bronchi. Heart size is normal. Moderate coronary artery calcifications. Moderate calcific atherosclerosis of the aortic arch.  LUNGS: Tracheobronchial tree is patent, no pneumothorax. Moderate centrilobular emphysema. Small faint ground-glass opacity RIGHT upper lobe. No pleural effusions, focal consolidations, parenchymal masses.  SOFT TISSUES AND OSSEOUS STRUCTURES: Included view of the abdomen is unremarkable. Visualized soft tissues and included osseous structures appear normal.  Review of the MIP images confirms the above findings.  IMPRESSION: Suboptimal examination without central pulmonary embolism, on this delayed phase examination.  4.6 x 4.7 x 7.3 cm sub carinal/mediastinal mass moderately narrowing the bilateral mainstem bronchi which remains patent. Differential diagnosis includes infectious or inflammatory lymphadenopathy, lymphoma, metastasis. Mucoid impaction within multiple lower lobe segmental bronchi.  Faint RIGHT upper lobe ground-glass opacity may be infectious or inflammatory. Moderate centrilobular emphysema.   Electronically Signed  By: CElon AlasM.D.  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 (Incompletely staged) EBUS FNA subcarinal mass, squamous cell carcinoma 01/01/2016 [facility]iation [facility] central lung [facility] Dysphagis, mild oral and mild moderate pharyngeal dysphagia Paroxysmal afib with EF 35% on ECHO 01/22/2016 Intractable hiccups on Baclofen 10 mg po tid Anemia, chronic disease Hyponatremia Acute hypoxic respiratory failure, COPD History of CVA with residual L sided weakness Urgent XRT secondary to airway compromise from  NSCLC  [facility] He is currently doing well with sequential chemotherapy, here today for cycle #2. Anemia is neoplasia and treatment related. We will reimage him after cycle #2.   I feel his ear symptoms are allergy related. We  have called him in [place] which he was on before.  He will return next week for labs and chemotherapy, the following week with appointment, labs and chemotherapy.   Orders Placed This Encounter  Procedures  . CT Chest W Contrast    Standing Status: Future     Number of Occurrences:      Standing Expiration Date: 04/04/2017    Order Specific Question:  If indicated for the ordered procedure, I authorize the administration of contrast media per Radiology protocol    Answer:  Yes    Order Specific Question:  Reason for Exam (SYMPTOM  OR DIAGNOSIS REQUIRED)    Answer:  restaging locally advanced NSCLC    Order Specific Question:  Preferred imaging location?    Answer:  [facility]   All questions were answered. The patient knows to call the clinic with any problems, questions or concerns.  This document serves as a record of services personally performed by Ancil Linsey, MD. It was created on her behalf by Toni Amend, a trained medical scribe. The creation of this record is based on the scribe's personal observations and the provider's statements to them. This document has been checked and approved by the attending provider.  I have reviewed the above documentation for accuracy and completeness, and I agree with the above.  This note was electronically signed.  Molli Hazard, MD  04/04/2016 10:18 AM

## 2016-04-04 NOTE — Patient Instructions (Addendum)
[place] at [facility] Discharge Instructions  RECOMMENDATIONS MADE BY THE CONSULTANT AND ANY TEST RESULTS WILL BE SENT TO YOUR REFERRING PHYSICIAN.     Exam and discussion by Dr Whitney Muse today Allegra sent to your pharmacy Chemotherapy today  CT scans after this cycle  Wear a mask when you are doing yard work  Return to see the doctor in 2 weeks  Please call the clinic if you have any questions or concerns     Thank you for choosing [place] at [facility] to provide your oncology and hematology care.  To afford each patient quality time with our provider, please arrive at least 15 minutes before your scheduled appointment time.   Beginning January 23rd 2017 lab work for the [facility] will be done in the  Main lab at [facility] on 1st floor. If you have a lab appointment with the [facility] please come in thru the  Main Entrance and check in at the main information desk  You need to re-schedule your appointment should you arrive 10 or more minutes late.  We strive to give you quality time with our providers, and arriving late affects you and other patients whose appointments are after yours.  Also, if you no show three or more times for appointments you may be dismissed from the clinic at the providers discretion.     Again, thank you for choosing [facility].  Our hope is that these requests will decrease the amount of time that you wait before being seen by our physicians.       _____________________________________________________________  Should you have questions after your visit to [facility], please contact our office at [phone] between the hours of 8:30 a.m. and 4:30 p.m.  Voicemails left after 4:30 p.m. will not be returned until the following business day.  For prescription refill requests, have your pharmacy contact our office.         Resources For Cancer Patients and their  Caregivers ? American Cancer Society: Can assist with transportation, wigs, general needs, runs Look Good Feel Better.        ([phone] 815-0352 ? Cancer Care: Provides financial assistance, online support groups, medication/co-pay assistance.  1-800-813-HOPE 857-309-8682) ? [place] Assists [place] cancer patients and their families through emotional , educational and financial support.  765-524-4872 ? [place] DSS Where to apply for food stamps, Medicaid and utility assistance. (678)758-4517 ? RC[facility]: Transportation to medical appointments. 831-395-0547 ? Social Security Administration: May apply for disability if have a Stage IV cancer. 386-054-2249 [phone] ? [employer], Disability and Transit Services: Assists with nutrition, care and transit needs. [phone]

## 2016-04-04 NOTE — Patient Instructions (Signed)
[facility] Discharge Instructions for Patients Receiving Chemotherapy   Beginning January 23rd 2017 lab work for the [facility] will be done in the  Main lab at [facility] on 1st floor. If you have a lab appointment with the [place] please come in thru the  Main Entrance and check in at the main information desk   Today you received the following chemotherapy agents: Carboplatin and abraxane.      If you develop nausea and vomiting, or diarrhea that is not controlled by your medication, call the clinic.  The clinic phone number is [phone]. Office hours are Monday-Friday 8:30am-5:00pm.  BELOW ARE SYMPTOMS THAT SHOULD BE REPORTED IMMEDIATELY:  *FEVER GREATER THAN 101.0 F  *CHILLS WITH OR WITHOUT FEVER  NAUSEA AND VOMITING THAT IS NOT CONTROLLED WITH YOUR NAUSEA MEDICATION  *UNUSUAL SHORTNESS OF BREATH  *UNUSUAL BRUISING OR BLEEDING  TENDERNESS IN MOUTH AND THROAT WITH OR WITHOUT PRESENCE OF ULCERS  *URINARY PROBLEMS  *BOWEL PROBLEMS  UNUSUAL RASH Items with * indicate a potential emergency and should be followed up as soon as possible. If you have an emergency after office hours please contact your primary care physician or go to the nearest emergency department.  Please call the clinic during office hours if you have any questions or concerns.   You may also contact the Patient Navigator at [phone] should you have any questions or need assistance in obtaining follow up care.      Resources For Cancer Patients and their Caregivers ? American Cancer Society: Can assist with transportation, wigs, general needs, runs Look Good Feel Better.        ([phone] ? Cancer Care: Provides financial assistance, online support groups, medication/co-pay assistance.  1-800-813-HOPE 618-478-0650) ? [facility] Assists [place] cancer patients and their families through emotional , educational and financial support.   770-468-1637 ? [place] DSS Where to apply for food stamps, Medicaid and utility assistance. 660-447-5685 ? RCATS: Transportation to medical appointments. 530-473-0922 ? Social Security Administration: May apply for disability if have a Stage IV cancer. 719-383-6179 501-763-8521 ? [employer], Disability and Transit Services: Assists with nutrition, care and transit needs. [phone]

## 2016-04-11 ENCOUNTER — Encounter (HOSPITAL_BASED_OUTPATIENT_CLINIC_OR_DEPARTMENT_OTHER): Payer: Medicare PPO

## 2016-04-11 VITALS — BP 114/77 | HR 84 | Temp 97.5°F | Resp 16 | Wt 146.0 lb

## 2016-04-11 DIAGNOSIS — Z5111 Encounter for antineoplastic chemotherapy: Secondary | ICD-10-CM | POA: Diagnosis not present

## 2016-04-11 DIAGNOSIS — C3491 Malignant neoplasm of unspecified part of right bronchus or lung: Secondary | ICD-10-CM

## 2016-04-11 LAB — CBC WITH DIFFERENTIAL/PLATELET
Basophils Absolute: 0 10*3/uL (ref 0.0–0.1)
Basophils Relative: 0 %
Eosinophils Absolute: 0.1 10*3/uL (ref 0.0–0.7)
Eosinophils Relative: 1 %
HEMATOCRIT: 28.2 % — AB (ref 39.0–52.0)
HEMOGLOBIN: 9.1 g/dL — AB (ref 13.0–17.0)
LYMPHS ABS: 0.6 10*3/uL — AB (ref 0.7–4.0)
Lymphocytes Relative: 12 %
MCH: 32.5 pg (ref 26.0–34.0)
MCHC: 32.3 g/dL (ref 30.0–36.0)
MCV: 100.7 fL — AB (ref 78.0–100.0)
Monocytes Absolute: 0.5 10*3/uL (ref 0.1–1.0)
Monocytes Relative: 9 %
NEUTROS PCT: 78 %
Neutro Abs: 3.9 10*3/uL (ref 1.7–7.7)
Platelets: 339 10*3/uL (ref 150–400)
RBC: 2.8 MIL/uL — AB (ref 4.22–5.81)
RDW: 16.7 % — ABNORMAL HIGH (ref 11.5–15.5)
WBC: 5.1 10*3/uL (ref 4.0–10.5)

## 2016-04-11 LAB — COMPREHENSIVE METABOLIC PANEL
ALK PHOS: 64 U/L (ref 38–126)
ALT: 27 U/L (ref 17–63)
AST: 27 U/L (ref 15–41)
Albumin: 3.3 g/dL — ABNORMAL LOW (ref 3.5–5.0)
Anion gap: 8 (ref 5–15)
BUN: 9 mg/dL (ref 6–20)
CALCIUM: 9.1 mg/dL (ref 8.9–10.3)
CO2: 24 mmol/L (ref 22–32)
CREATININE: 0.65 mg/dL (ref 0.61–1.24)
Chloride: 105 mmol/L (ref 101–111)
Glucose, Bld: 88 mg/dL (ref 65–99)
Potassium: 3.9 mmol/L (ref 3.5–5.1)
Sodium: 137 mmol/L (ref 135–145)
Total Bilirubin: 0.6 mg/dL (ref 0.3–1.2)
Total Protein: 6.7 g/dL (ref 6.5–8.1)

## 2016-04-11 MED ORDER — HEPARIN SOD (PORK) LOCK FLUSH 100 UNIT/ML IV SOLN
500.0000 [IU] | Freq: Once | INTRAVENOUS | Status: AC | PRN
  Administered 2016-04-11: 500 [IU]
  Filled 2016-04-11: qty 5

## 2016-04-11 MED ORDER — SODIUM CHLORIDE 0.9% FLUSH
10.0000 mL | INTRAVENOUS | Status: DC | PRN
  Administered 2016-04-11: 10 mL
  Filled 2016-04-11: qty 10

## 2016-04-11 MED ORDER — SODIUM CHLORIDE 0.9 % IV SOLN
Freq: Once | INTRAVENOUS | Status: AC
  Administered 2016-04-11: 12:00:00 via INTRAVENOUS

## 2016-04-11 MED ORDER — PALONOSETRON HCL INJECTION 0.25 MG/5ML
0.2500 mg | Freq: Once | INTRAVENOUS | Status: AC
  Administered 2016-04-11: 0.25 mg via INTRAVENOUS
  Filled 2016-04-11: qty 5

## 2016-04-11 MED ORDER — PACLITAXEL PROTEIN-BOUND CHEMO INJECTION 100 MG
100.0000 mg/m2 | Freq: Once | INTRAVENOUS | Status: AC
  Administered 2016-04-11: 175 mg via INTRAVENOUS
  Filled 2016-04-11: qty 35

## 2016-04-11 MED ORDER — LORAZEPAM 2 MG/ML IJ SOLN
0.5000 mg | Freq: Once | INTRAMUSCULAR | Status: AC | PRN
  Administered 2016-04-11: 0.5 mg via INTRAVENOUS
  Filled 2016-04-11: qty 1

## 2016-04-11 NOTE — Patient Instructions (Signed)
[facility] Discharge Instructions for Patients Receiving Chemotherapy   Beginning January 23rd 2017 lab work for the [facility] will be done in the  Main lab at [facility] on 1st floor. If you have a lab appointment with the [facility] please come in thru the  Main Entrance and check in at the main information desk   Today you received the following chemotherapy agents Abraxane.  To help prevent nausea and vomiting after your treatment, we encourage you to take your nausea medication as instructed.  If you develop nausea and vomiting, or diarrhea that is not controlled by your medication, call the clinic.  The clinic phone number is [phone]. Office hours are Monday-Friday 8:30am-5:00pm.  BELOW ARE SYMPTOMS THAT SHOULD BE REPORTED IMMEDIATELY:  *FEVER GREATER THAN 101.0 F  *CHILLS WITH OR WITHOUT FEVER  NAUSEA AND VOMITING THAT IS NOT CONTROLLED WITH YOUR NAUSEA MEDICATION  *UNUSUAL SHORTNESS OF BREATH  *UNUSUAL BRUISING OR BLEEDING  TENDERNESS IN MOUTH AND THROAT WITH OR WITHOUT PRESENCE OF ULCERS  *URINARY PROBLEMS  *BOWEL PROBLEMS  UNUSUAL RASH Items with * indicate a potential emergency and should be followed up as soon as possible. If you have an emergency after office hours please contact your primary care physician or go to the nearest emergency department.  Please call the clinic during office hours if you have any questions or concerns.   You may also contact the Patient Navigator at [phone] should you have any questions or need assistance in obtaining follow up care.  Resources For Cancer Patients and their Caregivers ? American Cancer Society: Can assist with transportation, wigs, general needs, runs Look Good Feel Better.        773-076-4737 ? Cancer Care: Provides financial assistance, online support groups, medication/co-pay assistance.  1-800-813-HOPE 667-563-8002) ? [place] Assists [place]  cancer patients and their families through emotional , educational and financial support.  9366426334 ? [place] DSS Where to apply for food stamps, Medicaid and utility assistance. 814-140-1077 ? RCATS: Transportation to medical appointments. 7473060299 ? Social Security Administration: May apply for disability if have a Stage IV cancer. ([phone] 605-584-2157 ? [employer], Disability and Transit Services: Assists with nutrition, care and transit needs. [phone]

## 2016-04-11 NOTE — Progress Notes (Signed)
Tolerated chemo well. Ambulatory on discharge home with sister.

## 2016-04-14 ENCOUNTER — Other Ambulatory Visit (HOSPITAL_COMMUNITY): Payer: Self-pay | Admitting: *Deleted

## 2016-04-14 DIAGNOSIS — C3491 Malignant neoplasm of unspecified part of right bronchus or lung: Secondary | ICD-10-CM

## 2016-04-14 MED ORDER — LORAZEPAM 1 MG PO TABS: 1.0000 mg | ORAL_TABLET | Freq: Three times a day (TID) | ORAL | Status: DC

## 2016-04-14 MED ORDER — ESCITALOPRAM OXALATE 10 MG PO TABS: 10.0000 mg | ORAL_TABLET | Freq: Every day | ORAL | Status: DC

## 2016-04-15 ENCOUNTER — Ambulatory Visit (INDEPENDENT_AMBULATORY_CARE_PROVIDER_SITE_OTHER): Payer: Medicare PPO | Admitting: Cardiology

## 2016-04-15 ENCOUNTER — Encounter: Payer: Self-pay | Admitting: Cardiology

## 2016-04-15 VITALS — BP 104/66 | HR 98 | Ht 65.0 in | Wt 144.0 lb

## 2016-04-15 DIAGNOSIS — I480 Paroxysmal atrial fibrillation: Secondary | ICD-10-CM | POA: Diagnosis not present

## 2016-04-15 DIAGNOSIS — I5022 Chronic systolic (congestive) heart failure: Secondary | ICD-10-CM | POA: Diagnosis not present

## 2016-04-15 DIAGNOSIS — E785 Hyperlipidemia, unspecified: Secondary | ICD-10-CM

## 2016-04-15 MED ORDER — LISINOPRIL 2.5 MG PO TABS: 2.5000 mg | ORAL_TABLET | Freq: Every day | ORAL | Status: DC

## 2016-04-15 NOTE — Progress Notes (Signed)
Patient ID: Tyler Aguilar, male   DOB: September 07, 1956, 60 y.o.   MRN: [id number]     Clinical Summary Tyler Aguilar is a 60 y.o.male seen today for follow up of the following medical problems.   1. Chronic systolic heart failure - echo Jan 2017 LVEF 50-55% - during admit at [facility] 01/2016 LVEF dropped to 35%. This was in the setting of rapid afib and acute respiratory illness and and respiratory failure with intubation. From notes acute heart failure at that time as well, diuresed 10 liters.  - repeat echo 02/2016 showed LVEF 35-40%, multiple WMAs.   - denies any SOB or DOE. No LE edema. No orthopnea.   2. Afib - episode during admission at [facility] in 01/2016 - on xarelto,can have occasoinal nose bleeds but overall mild - denies any palpitations.    3. Lung CA - history of radiation and chemo.   4. CVA - on ASA for secondary prevention, followed by neurology    Past Medical History  Diagnosis Date  . 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 COPD (chronic obstructive pulmonary disease) ([place])   . AAA (abdominal aortic aneurysm) (HCC)     4.5 cm December 2016  . Dyslipidemia   . Lung cancer ([place])     Stage III squamous cell, right lung  . Cardiomyopathy ([place])     LVEF 35% February 2017 [facility]  . PAF (paroxysmal atrial fibrillation) (HCC)     Versus SVT documented February 2017 [facility]     No Known Allergies   Current Outpatient Prescriptions  Medication Sig Dispense Refill  . albuterol (PROVENTIL HFA;VENTOLIN HFA) 108 (90 Base) MCG/ACT inhaler Inhale 2 puffs into the lungs every 4 (four) hours as needed for wheezing or shortness of breath.    Marland Kitchen aspirin 325 MG tablet Take 1 tablet (325 mg total) by mouth daily. 90 tablet 2  . atorvastatin (LIPITOR) 80 MG tablet Take 80 mg by mouth every evening.    . baclofen (LIORESAL) 10 MG tablet Take 1 tablet (10 mg total) by mouth 3 (three) times daily as needed  (hiccups). 60 each 0  . budesonide-formoterol (SYMBICORT) 160-4.5 MCG/ACT inhaler Inhale 2 puffs into the lungs 2 (two) times daily.    Marland Kitchen CARAFATE 1 GM/10ML suspension     . CARBOPLATIN IV Inject into the vein. To start 3/24    . carvedilol (COREG) 3.125 MG tablet Take 1 tablet (3.125 mg total) by mouth 2 (two) times daily. 180 tablet 3  . escitalopram (LEXAPRO) 10 MG tablet Take 1 tablet (10 mg total) by mouth daily. 30 tablet 1  . fexofenadine (ALLEGRA ALLERGY) 180 MG tablet Take 1 tablet (180 mg total) by mouth daily. 30 tablet 3  . lidocaine-prilocaine (EMLA) cream Apply a quarter size amount to port site 1 hour prior to chemo. Do not rub in. Cover with plastic wrap. 30 g 3  . LORazepam (ATIVAN) 1 MG tablet Take 1 tablet (1 mg total) by mouth 3 (three) times daily. 60 tablet 0  . magic mouthwash w/lidocaine SOLN Take 30 mLs by mouth.    . ondansetron (ZOFRAN) 8 MG tablet Take 8 mg by mouth every 8 (eight) hours as needed for nausea or vomiting.    Marland Kitchen oxyCODONE (ROXICODONE) 5 MG immediate release tablet Take 5 mg by mouth.    Marland Kitchen PACLitaxel Protein-Bound Part (ABRAXANE IV) Inject into the vein. To start  3/24.    . pantoprazole (PROTONIX) 40 MG tablet Take 1 tablet (40 mg total) by mouth daily. 90 tablet 2  . pyridOXINE (VITAMIN B-6) 50 MG tablet Take 50 mg by mouth daily.    . ranitidine (ZANTAC) 150 MG tablet Take 1 tablet (150 mg total) by mouth 2 (two) times daily. 180 tablet 2  . rivaroxaban (XARELTO) 20 MG TABS tablet Take 1 tablet (20 mg total) by mouth daily. 90 tablet 2  . sodium chloride (OCEAN) 0.65 % nasal spray Place into the nose.    Marland Kitchen Spacer/Aero-Holding Chambers (AEROCHAMBER PLUS FLO-VU) MISC     . Tiotropium Bromide Monohydrate (SPIRIVA RESPIMAT) 2.5 MCG/ACT AERS Inhale 2 puffs into the lungs daily. 4 g 0   No current facility-administered medications for this visit.     Past Surgical History  Procedure Laterality Date  . Finger surgery Right 1979  . Colonoscopy  08/31/2012     Procedure: COLONOSCOPY;  Surgeon: Jamesetta So, MD;  Location: [facility];  Service: Gastroenterology;  Laterality: N/A;  . Bronchoscopy    . Portacath placement Left 02/27/2016    Procedure: INSERTION PORT-A-CATH LEFT SUBCLAVIAN;  Surgeon: Aviva Signs, MD;  Location: [facility] ORS;  Service: General;  Laterality: Left;     No Known Allergies    Family History  Problem Relation Age of Onset  . Diabetes Mellitus II Mother   . Cancer Mother   . Cancer Father   . Coronary artery disease Brother     CABG     Social History Tyler Aguilar reports that he quit smoking about 3 months ago. His smoking use included Cigarettes. He does not have any smokeless tobacco history on file. Tyler Aguilar reports that he drinks alcohol.   Review of Systems CONSTITUTIONAL: No weight loss, fever, chills, weakness or fatigue.  HEENT: Eyes: No visual loss, blurred vision, double vision or yellow sclerae.No hearing loss, sneezing, congestion, runny nose or sore throat.  SKIN: No rash or itching.  CARDIOVASCULAR: per HPI RESPIRATORY: No shortness of breath, cough or sputum.  GASTROINTESTINAL: No anorexia, nausea, vomiting or diarrhea. No abdominal pain or blood.  GENITOURINARY: No burning on urination, no polyuria NEUROLOGICAL: No headache, dizziness, syncope, paralysis, ataxia, numbness or tingling in the extremities. No change in bowel or bladder control.  MUSCULOSKELETAL: No muscle, back pain, joint pain or stiffness.  LYMPHATICS: No enlarged nodes. No history of splenectomy.  PSYCHIATRIC: No history of depression or anxiety.  ENDOCRINOLOGIC: No reports of sweating, cold or heat intolerance. No polyuria or polydipsia.  Marland Kitchen   Physical Examination Filed Vitals:   04/15/16 0850  BP: 104/66  Pulse: 98   Filed Vitals:   04/15/16 0850  Height: '5\' 5"'$  (1.651 m)  Weight: 144 lb (65.318 kg)    Gen: resting comfortably, no acute distress HEENT: no scleral icterus, pupils equal round and reactive, no  palptable cervical adenopathy,  CV: RRR, no m/r/g, no jvd Resp: Clear to auscultation bilaterally GI: abdomen is soft, non-tender, non-distended, normal bowel sounds, no hepatosplenomegaly MSK: extremities are warm, no edema.  Skin: warm, no rash Neuro:  no focal deficits Psych: appropriate affect   Diagnostic Studies  Echocardiogram 01/22/2016 [facility]): SUMMARY Technically difficult images d/t Atrial fibrillation and patient sitting  straight up in bed with difficulty breathing. The left ventricle is borderline dilated. There is moderate concentric left ventricular hypertrophy. Overall left ventricular function appears to be moderately reduced . LV ejection fraction = 35%. There is moderate global hypokinesis of the left  ventricle. The right ventricle is normal size. The right ventricular systolic function is mildly reduced. There is dysynchrony of the LV and RV. The left atrial size is normal.  Right atrium not well visualized. There is aortic valve sclerosis. There is mild aortic regurgitation. There is no pericardial effusion. Compared to exam images of 05/03/08, LV size has increased and function has  decreased.  Echocardiogram 01/03/2016: Study Conclusions  - Left ventricle: The cavity size was normal. Wall thickness was  increased in a pattern of mild LVH. Systolic function was normal.  The estimated ejection fraction was in the range of 50% to 55%.  Doppler parameters are consistent with abnormal left ventricular  relaxation (grade 1 diastolic dysfunction). Doppler parameters  are consistent with high ventricular filling pressure. - Aortic valve: Mildly calcified annulus. Trileaflet; mildly  thickened leaflets. There was mild regurgitation. Valve area  (VTI): 2.31 cm^2. Valve area (Vmax): 2.16 cm^2. - Aorta: The visualized portion of the proximal ascending aorta is  mildly dilated at 40 mm. Aortic root dimension: 42 mm (ED). - Aortic root: The aortic root  was mildly dilated. - Atrial septum: No defect or patent foramen ovale was identified. - Technically difficult study.  02/2016 Echo Study Conclusions  - Left ventricle: LVEF is approximately 35 to 40% with  hypokinesis/akineis of the lateral wall; akinesis of the  mid/distal posterior wall. The cavity size was normal. Wall  thickness was increased in a pattern of mild LVH. Doppler  parameters are consistent with abnormal left ventricular  relaxation (grade 1 diastolic dysfunction). - Mitral valve: Calcified annulus. Mildly thickened leaflets .   Assessment and Plan   1. Chronic systolic HF - LVEF 19% by echo 01/2016 at [facility] in setting of acute respiratory illness and afib - repeat echo LVEF 35-40% with multiple WMAs - we will start lisinopril 2.'5mg'$  daily, cautious medication titration in setting of low normal bp's. Can consider changing to entresto in the future, given high costs would not start with in case his bp's cannot tolerate additional therapy.  - defer ischemic testing and follow progress of cancer treatments  2. Afib - no current symptoms - continue rate control. CHADS2Vasc score of 3 (CVA, CHF), continue xarelto  3. Hyperlipidemia - continue high dose statin in setting of prior CVA  F/u 1 month    Arnoldo Lenis, M.D.

## 2016-04-15 NOTE — Patient Instructions (Signed)
Medication Instructions:  START LISINOPRIL 2.5 MG DAILY   Labwork: NONE  Testing/Procedures:  NONE  Follow-Up: Your physician recommends that you schedule a follow-up appointment in: [place] DR. BRANCH    Any Other Special Instructions Will Be Listed Below (If Applicable).     If you need a refill on your cardiac medications before your next appointment, please call your pharmacy.

## 2016-04-17 NOTE — Progress Notes (Signed)
Tyler Bogus, MD [address] [place] [address]  Squamous cell lung cancer, right [facility]) - Plan: CBC with Differential, Sample to Blood Bank  CURRENT THERAPY: Carboplatin/Abraxane day 1, 8, 15 every 28 day fashion beginning on 03/07/2016.  INTERVAL HISTORY: Tyler Aguilar 60 y.o. 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  Now on systemic chemotherapy consisting of Carboplatin/Abraxane beginning on 03/07/2016.    Squamous cell lung cancer ([place])   01/01/2016 Pathology Results EBUS FNA subcarinal mass, squamous cell carcinoma   01/11/2016 -  Radiation Therapy RT for palliation of central lung NSCLC at [place] started on 01/11/2016   03/07/2016 -  Chemotherapy Carboplatin/Abraxane day 1, 8, 15 every 28 day   03/12/2016 Imaging CT chest- Pulmonary parenchymal pattern of peribronchovascular ground-glass, nodularity and consolidation is likely new or progressive from chest radiograph 02/27/2016 and may be due to an infectious bronchiolitis/bronchopneumonia.    I personally reviewed and went over laboratory results with the patient.  The results are noted within this dictation.  Labs will be updated today.    He denies any nausea or vomiting. His appetite is very strong. Weight today is 149 pounds compared to 143 pounds on 04/04/2016. Overall, his weight is up nearly 20 pounds over the past 2-3 months.  He saw cardiology recently.  Past Medical History  Diagnosis Date  . 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 COPD (chronic obstructive pulmonary disease) ([place])   . AAA (abdominal aortic aneurysm) (HCC)     4.5 cm December 2016  . Dyslipidemia   . Lung cancer  ([name])     Stage III squamous cell, right lung  . Cardiomyopathy ([place])     LVEF 35% February 2017 [facility]  . PAF (paroxysmal atrial fibrillation) (HCC)     Versus SVT documented February 2017 [facility]    has COPD (chronic obstructive pulmonary disease) ([place]); Acute respiratory failure with hypoxia ([place]); Elevated troponin; Chronic obstructive pulmonary disease with acute exacerbation ([place]); Mediastinal mass; History of stroke; Acute respiratory failure ([place]); PVD (peripheral vascular disease) ([place]); Smoker; Dyslipidemia; RBBB; Squamous cell lung cancer ([place]); Physical deconditioning; Persistent atrial fibrillation ([place]); and Hiccups on his problem list.     has No Known Allergies.  Current Outpatient Prescriptions on File Prior to Visit  Medication Sig Dispense Refill  . albuterol (PROVENTIL HFA;VENTOLIN HFA) 108 (90 Base) MCG/ACT inhaler Inhale 2 puffs into the lungs every 4 (four) hours as needed for wheezing or shortness of breath.    Marland Kitchen aspirin 325 MG tablet Take 1 tablet (325 mg total) by mouth daily. 90 tablet 2  . atorvastatin (LIPITOR) 80 MG tablet Take 80 mg by mouth every evening.    . budesonide-formoterol (SYMBICORT) 160-4.5 MCG/ACT inhaler Inhale 2 puffs into the lungs 2 (two) times daily.    Marland Kitchen CARBOPLATIN IV Inject into the vein. To start 3/24    . carvedilol (COREG) 3.125 MG tablet Take 1 tablet (3.125 mg total) by mouth 2 (two) times daily. 180 tablet 3  . escitalopram (LEXAPRO) 10 MG tablet Take 1 tablet (10 mg total) by mouth daily. 30 tablet 1  . fexofenadine (ALLEGRA ALLERGY) 180 MG tablet  Take 1 tablet (180 mg total) by mouth daily. 30 tablet 3  . lidocaine-prilocaine (EMLA) cream Apply a quarter size amount to port site 1 hour prior to chemo. Do not rub in. Cover with plastic wrap. 30 g 3  . lisinopril (PRINIVIL,ZESTRIL) 2.5 MG tablet Take 1 tablet (2.5 mg total) by mouth daily. 90 tablet 3  . LORazepam (ATIVAN) 1 MG tablet Take 1 tablet (1 mg total) by mouth 3 (three)  times daily. 60 tablet 0  . magic mouthwash w/lidocaine SOLN Take 30 mLs by mouth.    Marland Kitchen PACLitaxel Protein-Bound Part (ABRAXANE IV) Inject into the vein. To start 3/24.    Marland Kitchen pantoprazole (PROTONIX) 40 MG tablet Take 1 tablet (40 mg total) by mouth daily. 90 tablet 2  . pyridOXINE (VITAMIN B-6) 50 MG tablet Take 50 mg by mouth daily.    . ranitidine (ZANTAC) 150 MG tablet Take 1 tablet (150 mg total) by mouth 2 (two) times daily. 180 tablet 2  . rivaroxaban (XARELTO) 20 MG TABS tablet Take 1 tablet (20 mg total) by mouth daily. 90 tablet 2  . sodium chloride (OCEAN) 0.65 % nasal spray Place into the nose.    Marland Kitchen Spacer/Aero-Holding Chambers (AEROCHAMBER PLUS FLO-VU) MISC     . Tiotropium Bromide Monohydrate (SPIRIVA RESPIMAT) 2.5 MCG/ACT AERS Inhale 2 puffs into the lungs daily. 4 g 0   Current Facility-Administered Medications on File Prior to Visit  Medication Dose Route Frequency Provider Last Rate Last Dose  . LORazepam (ATIVAN) injection 0.5 mg  0.5 mg Intravenous Once PRN Baird Cancer, PA-C      . sodium chloride flush (NS) 0.9 % injection 10 mL  10 mL Intracatheter PRN Patrici Ranks, MD   10 mL at 04/18/16 0830    Past Surgical History  Procedure Laterality Date  . Finger surgery Right 1979  . Colonoscopy  08/31/2012    Procedure: COLONOSCOPY;  Surgeon: Jamesetta So, MD;  Location: [facility];  Service: Gastroenterology;  Laterality: N/A;  . Bronchoscopy    . Portacath placement Left 02/27/2016    Procedure: INSERTION PORT-A-CATH LEFT SUBCLAVIAN;  Surgeon: Aviva Signs, MD;  Location: [facility] ORS;  Service: General;  Laterality: Left;    Denies any headaches, dizziness, double vision, fevers, chills, night sweats, nausea, vomiting, diarrhea, constipation, chest pain, heart palpitations, shortness of breath, blood in stool, black tarry stool, urinary pain, urinary burning, urinary frequency, hematuria.   PHYSICAL EXAMINATION  ECOG PERFORMANCE STATUS: 0 - Asymptomatic  There  were no vitals filed for this visit.  GENERAL:alert, no distress, well nourished, well developed, comfortable, cooperative, smiling and in chemo-recliner, accompanied by his sister, Santiago Glad. SKIN: skin color, texture, turgor are normal, no rashes or significant lesions HEAD: Normocephalic, No masses, lesions, tenderness or abnormalities EYES: normal, EOMI, Conjunctiva are pink and non-injected EARS: External ears normal OROPHARYNX:lips, buccal mucosa, and tongue normal and mucous membranes are moist  NECK: supple, trachea midline LYMPH:  no palpable lymphadenopathy BREAST:not examined LUNGS: clear to auscultation without wheezes, rales, or rhonchi. HEART: regular rate & rhythm, no murmurs, no gallops, S1 normal and S2 normal ABDOMEN:abdomen soft and normal bowel sounds BACK: Back symmetric, no curvature. EXTREMITIES:less then 2 second capillary refill, no joint deformities, effusion, or inflammation, no skin discoloration, no cyanosis  NEURO: alert & oriented x 3 with fluent speech, no focal motor/sensory deficits, gait normal   LABORATORY DATA: CBC    Component Value Date/Time   WBC 2.7* 04/18/2016 0915   RBC 2.58*  04/18/2016 0915   HGB 8.6* 04/18/2016 0915   HCT 25.9* 04/18/2016 0915   PLT 247 04/18/2016 0915   MCV 100.4* 04/18/2016 0915   MCH 33.3 04/18/2016 0915   MCHC 33.2 04/18/2016 0915   RDW 18.4* 04/18/2016 0915   LYMPHSABS 0.5* 04/18/2016 0915   MONOABS 0.6 04/18/2016 0915   EOSABS 0.0 04/18/2016 0915   BASOSABS 0.0 04/18/2016 0915      Chemistry      Component Value Date/Time   NA 138 04/18/2016 0915   K 4.0 04/18/2016 0915   CL 106 04/18/2016 0915   CO2 25 04/18/2016 0915   BUN 10 04/18/2016 0915   CREATININE 0.68 04/18/2016 0915      Component Value Date/Time   CALCIUM 8.7* 04/18/2016 0915   ALKPHOS 63 04/18/2016 0915   AST 28 04/18/2016 0915   ALT 28 04/18/2016 0915   BILITOT 0.5 04/18/2016 0915        PENDING LABS:   RADIOGRAPHIC  STUDIES:  No results found.   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  Now on systemic chemotherapy consisting of Carboplatin/Abraxane beginning on 03/07/2016.  Oncology history developed.  Staging in [facility] problem list completed with available data in CareEverywhere at time of Dx.  Pre-treatment labs as ordered today.  Labs today demonstrate a progressive anemia at 8.6 g/dL and ANC of 1.6.  Today is Day 15 of treatment (Abraxane only).  He will be treated today as planned. I did discuss his hemoglobin and progressive anemia. He is not too keen on a blood transfusion. Since he is being treated with curative intent, he is probably not a candidate for Aranesp therapy.  He is tolerating treatment well.  His weight is stable.  He denies any nausea/vomiting.  CT of chest is ordered for 04/30/2016.  Labs next week: CBC diff, sample to blood bank.  Return in 2 weeks for treatment and follow-up appointment.    THERAPY PLAN:  Continue with treatment as planned.    All questions were answered. The patient knows to call the clinic with any problems, questions or concerns. We can certainly see the patient much sooner if necessary.  Patient and plan discussed with Dr. Ancil Linsey and she is in agreement with the aforementioned.   This note is electronically signed by: Robynn Pane, PA-C 04/18/2016 4:07 PM

## 2016-04-17 NOTE — Assessment & Plan Note (Addendum)
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  Now on systemic chemotherapy consisting of Carboplatin/Abraxane beginning on 03/07/2016.  Oncology history developed.  Staging in [facility] problem list completed with available data in CareEverywhere at time of Dx.  Pre-treatment labs as ordered today.  Labs today demonstrate a progressive anemia at 8.6 g/dL and ANC of 1.6.  Today is Day 15 of treatment (Abraxane only).  He will be treated today as planned. I did discuss his hemoglobin and progressive anemia. He is not too keen on a blood transfusion. Since he is being treated with curative intent, he is probably not a candidate for Aranesp therapy.  He is tolerating treatment well.  His weight is stable.  He denies any nausea/vomiting.  CT of chest is ordered for 04/30/2016.  Labs next week: CBC diff, sample to blood bank.  Return in 2 weeks for treatment and follow-up appointment.

## 2016-04-18 ENCOUNTER — Encounter (HOSPITAL_COMMUNITY): Payer: Medicare PPO | Attending: Hematology & Oncology

## 2016-04-18 ENCOUNTER — Encounter (HOSPITAL_BASED_OUTPATIENT_CLINIC_OR_DEPARTMENT_OTHER): Payer: Medicare PPO | Admitting: Oncology

## 2016-04-18 ENCOUNTER — Encounter (HOSPITAL_COMMUNITY): Payer: Self-pay | Admitting: Oncology

## 2016-04-18 VITALS — BP 110/78 | HR 90 | Temp 98.0°F | Resp 18 | Wt 149.0 lb

## 2016-04-18 DIAGNOSIS — C3491 Malignant neoplasm of unspecified part of right bronchus or lung: Secondary | ICD-10-CM

## 2016-04-18 DIAGNOSIS — R042 Hemoptysis: Secondary | ICD-10-CM | POA: Insufficient documentation

## 2016-04-18 DIAGNOSIS — Z5111 Encounter for antineoplastic chemotherapy: Secondary | ICD-10-CM | POA: Diagnosis not present

## 2016-04-18 DIAGNOSIS — R05 Cough: Secondary | ICD-10-CM | POA: Diagnosis present

## 2016-04-18 DIAGNOSIS — R938 Abnormal findings on diagnostic imaging of other specified body structures: Secondary | ICD-10-CM | POA: Diagnosis present

## 2016-04-18 DIAGNOSIS — D649 Anemia, unspecified: Secondary | ICD-10-CM | POA: Diagnosis not present

## 2016-04-18 LAB — COMPREHENSIVE METABOLIC PANEL
ALT: 28 U/L (ref 17–63)
AST: 28 U/L (ref 15–41)
Albumin: 3.3 g/dL — ABNORMAL LOW (ref 3.5–5.0)
Alkaline Phosphatase: 63 U/L (ref 38–126)
Anion gap: 7 (ref 5–15)
BILIRUBIN TOTAL: 0.5 mg/dL (ref 0.3–1.2)
BUN: 10 mg/dL (ref 6–20)
CHLORIDE: 106 mmol/L (ref 101–111)
CO2: 25 mmol/L (ref 22–32)
Calcium: 8.7 mg/dL — ABNORMAL LOW (ref 8.9–10.3)
Creatinine, Ser: 0.68 mg/dL (ref 0.61–1.24)
Glucose, Bld: 86 mg/dL (ref 65–99)
POTASSIUM: 4 mmol/L (ref 3.5–5.1)
Sodium: 138 mmol/L (ref 135–145)
TOTAL PROTEIN: 6.4 g/dL — AB (ref 6.5–8.1)

## 2016-04-18 LAB — CBC WITH DIFFERENTIAL/PLATELET
Basophils Absolute: 0 10*3/uL (ref 0.0–0.1)
Basophils Relative: 0 %
EOS PCT: 1 %
Eosinophils Absolute: 0 10*3/uL (ref 0.0–0.7)
HEMATOCRIT: 25.9 % — AB (ref 39.0–52.0)
Hemoglobin: 8.6 g/dL — ABNORMAL LOW (ref 13.0–17.0)
LYMPHS ABS: 0.5 10*3/uL — AB (ref 0.7–4.0)
Lymphocytes Relative: 18 %
MCH: 33.3 pg (ref 26.0–34.0)
MCHC: 33.2 g/dL (ref 30.0–36.0)
MCV: 100.4 fL — AB (ref 78.0–100.0)
MONO ABS: 0.6 10*3/uL (ref 0.1–1.0)
Monocytes Relative: 22 %
NEUTROS ABS: 1.6 10*3/uL — AB (ref 1.7–7.7)
NEUTROS PCT: 59 %
PLATELETS: 247 10*3/uL (ref 150–400)
RBC: 2.58 MIL/uL — ABNORMAL LOW (ref 4.22–5.81)
RDW: 18.4 % — AB (ref 11.5–15.5)
WBC: 2.7 10*3/uL — AB (ref 4.0–10.5)

## 2016-04-18 MED ORDER — SODIUM CHLORIDE 0.9% FLUSH
10.0000 mL | INTRAVENOUS | Status: DC | PRN
  Administered 2016-04-18: 10 mL
  Filled 2016-04-18: qty 10

## 2016-04-18 MED ORDER — LORAZEPAM 2 MG/ML IJ SOLN
0.5000 mg | Freq: Once | INTRAMUSCULAR | Status: AC
  Administered 2016-04-18: 0.5 mg via INTRAVENOUS
  Filled 2016-04-18: qty 1

## 2016-04-18 MED ORDER — HEPARIN SOD (PORK) LOCK FLUSH 100 UNIT/ML IV SOLN
500.0000 [IU] | Freq: Once | INTRAVENOUS | Status: AC | PRN
Start: 2016-04-18 — End: 2016-04-18
  Administered 2016-04-18: 500 [IU]
  Filled 2016-04-18: qty 5

## 2016-04-18 MED ORDER — PALONOSETRON HCL INJECTION 0.25 MG/5ML
0.2500 mg | Freq: Once | INTRAVENOUS | Status: AC
  Administered 2016-04-18: 0.25 mg via INTRAVENOUS
  Filled 2016-04-18: qty 5

## 2016-04-18 MED ORDER — PACLITAXEL PROTEIN-BOUND CHEMO INJECTION 100 MG
100.0000 mg/m2 | Freq: Once | INTRAVENOUS | Status: AC
Start: 2016-04-18 — End: 2016-04-18
  Administered 2016-04-18: 175 mg via INTRAVENOUS
  Filled 2016-04-18: qty 35

## 2016-04-18 MED ORDER — LORAZEPAM 2 MG/ML IJ SOLN: 0.5000 mg | Freq: Once | INTRAMUSCULAR | Status: DC | PRN

## 2016-04-18 MED ORDER — SODIUM CHLORIDE 0.9 % IV SOLN
Freq: Once | INTRAVENOUS | Status: AC
  Administered 2016-04-18: 10:00:00 via INTRAVENOUS

## 2016-04-18 NOTE — Progress Notes (Signed)
1135:  Tolerated tx w/o adverse reaction.  VSS.  In no distress.  Discharged ambulatory in c/o sister for transport home.

## 2016-04-18 NOTE — Patient Instructions (Signed)
[facility] Discharge Instructions for Patients Receiving Chemotherapy   Beginning January 23rd 2017 lab work for the [facility] will be done in the  Main lab at [facility] on 1st floor. If you have a lab appointment with the [facility] please come in thru the  Main Entrance and check in at the main information desk   Today you received the following chemotherapy agents:  Abraxane  If you develop nausea and vomiting, or diarrhea that is not controlled by your medication, call the clinic.  The clinic phone number is [phone]. Office hours are Monday-Friday 8:30am-5:00pm.  BELOW ARE SYMPTOMS THAT SHOULD BE REPORTED IMMEDIATELY:  *FEVER GREATER THAN 101.0 F  *CHILLS WITH OR WITHOUT FEVER  NAUSEA AND VOMITING THAT IS NOT CONTROLLED WITH YOUR NAUSEA MEDICATION  *UNUSUAL SHORTNESS OF BREATH  *UNUSUAL BRUISING OR BLEEDING  TENDERNESS IN MOUTH AND THROAT WITH OR WITHOUT PRESENCE OF ULCERS  *URINARY PROBLEMS  *BOWEL PROBLEMS  UNUSUAL RASH Items with * indicate a potential emergency and should be followed up as soon as possible. If you have an emergency after office hours please contact your primary care physician or go to the nearest emergency department.  Please call the clinic during office hours if you have any questions or concerns.   You may also contact the Patient Navigator at [phone] should you have any questions or need assistance in obtaining follow up care.      Resources For Cancer Patients and their Caregivers ? American Cancer Society: Can assist with transportation, wigs, general needs, runs Look Good Feel Better.        806-446-2355 ? Cancer Care: Provides financial assistance, online support groups, medication/co-pay assistance.  1-800-813-HOPE 5792552889) ? [place] Assists [place] cancer patients and their families through emotional , educational and financial support.   (936)643-7600 ? [place] DSS Where to apply for food stamps, Medicaid and utility assistance. 825-836-3114 ? [facility]: Transportation to medical appointments. 463-717-7417 ? Social Security Administration: May apply for disability if have a Stage IV cancer. 831 816 2888 574-500-2562 ? [employer], Disability and Transit Services: Assists with nutrition, care and transit needs. 954-368-7856

## 2016-04-18 NOTE — Patient Instructions (Signed)
Tyler Aguilar at [facility] Discharge Instructions  RECOMMENDATIONS MADE BY THE CONSULTANT AND ANY TEST RESULTS WILL BE SENT TO YOUR REFERRING PHYSICIAN.  Exam done and seen today by Kirby Cr[facility]ler Labs reviewed today. Treatment today. Labs on Thursday with blood sample to blood bank Return to see the Doctor in 2 weeks with treatment Call the clinic for any concerns or questions.   Thank you for choosing [place] at [facility] to provide your oncology and hematology care.  To afford each patient quality time with our provider, please arrive at least 15 minutes before your scheduled appointment time.   Beginning January 23rd 2017 lab work for the [facility] will be done in the  Main lab at [facility] on 1st floor. If you have a lab appointment with the [facility] please come in thru the  Main Entrance and check in at the main information desk  You need to re-schedule your appointment should you arrive 10 or more minutes late.  We strive to give you quality time with our providers, and arriving late affects you and other patients whose appointments are after yours.  Also, if you no show three or more times for appointments you may be dismissed from the clinic at the providers discretion.     Again, thank you for choosing [facility].  Our hope is that these requests will decrease the amount of time that you wait before being seen by our physicians.       _____________________________________________________________  Should you have questions after your visit to [facility], please contact our office at [phone] between the hours of 8:30 a.m. and 4:30 p.m.  Voicemails left after 4:30 p.m. will not be returned until the following business day.  For prescription refill requests, have your pharmacy contact our office.         Resources For Cancer Patients and their Caregivers ? American Cancer Society: Can  assist with transportation, wigs, general needs, runs Look Good Feel Better.        218-147-2315 ? Cancer Care: Provides financial assistance, online support groups, medication/co-pay assistance.  1-800-813-HOPE 517-631-7318) ? [place] Assists [place] cancer patients and their families through emotional , educational and financial support.  781-318-0573 ? [place] DSS Where to apply for food stamps, Medicaid and utility assistance. 905-246-7316 ? RCATS: Transportation to medical appointments. 858-777-2072 ? Social Security Administration: May apply for disability if have a Stage IV cancer. 801-656-4507 [phone] ? [employer], Disability and Transit Services: Assists with nutrition, care and transit needs. [phone]

## 2016-04-19 ENCOUNTER — Encounter (HOSPITAL_COMMUNITY): Payer: Self-pay | Admitting: Hematology & Oncology

## 2016-04-21 ENCOUNTER — Telehealth (HOSPITAL_COMMUNITY): Payer: Self-pay | Admitting: *Deleted

## 2016-04-21 NOTE — Telephone Encounter (Signed)
Patient has been on Allegra x 2 weeks and pt just started having shooting pains intermittently in RT ear on Saturday pm 04/19/16. He isn't sure if he is developing an ear infection. Tom recommended pt try Tylenol and go to Dr. Luan Pulling if it gets worse. This was relayed to pt's sister. Pt's sister notified to call me if it gets worse so I can let [name] know.

## 2016-04-24 ENCOUNTER — Encounter (HOSPITAL_COMMUNITY): Payer: Medicare PPO

## 2016-04-24 DIAGNOSIS — C3491 Malignant neoplasm of unspecified part of right bronchus or lung: Secondary | ICD-10-CM

## 2016-04-24 LAB — COMPREHENSIVE METABOLIC PANEL
ALT: 22 U/L (ref 17–63)
ANION GAP: 7 (ref 5–15)
AST: 24 U/L (ref 15–41)
Albumin: 3.6 g/dL (ref 3.5–5.0)
Alkaline Phosphatase: 65 U/L (ref 38–126)
BILIRUBIN TOTAL: 0.7 mg/dL (ref 0.3–1.2)
BUN: 9 mg/dL (ref 6–20)
CO2: 25 mmol/L (ref 22–32)
Calcium: 9.1 mg/dL (ref 8.9–10.3)
Chloride: 102 mmol/L (ref 101–111)
Creatinine, Ser: 0.59 mg/dL — ABNORMAL LOW (ref 0.61–1.24)
Glucose, Bld: 92 mg/dL (ref 65–99)
POTASSIUM: 4.6 mmol/L (ref 3.5–5.1)
Sodium: 134 mmol/L — ABNORMAL LOW (ref 135–145)
TOTAL PROTEIN: 6.9 g/dL (ref 6.5–8.1)

## 2016-04-24 LAB — CBC WITH DIFFERENTIAL/PLATELET
BASOS PCT: 0 %
Basophils Absolute: 0 10*3/uL (ref 0.0–0.1)
Eosinophils Absolute: 0 10*3/uL (ref 0.0–0.7)
Eosinophils Relative: 0 %
HEMATOCRIT: 26.9 % — AB (ref 39.0–52.0)
Hemoglobin: 9 g/dL — ABNORMAL LOW (ref 13.0–17.0)
LYMPHS PCT: 15 %
Lymphs Abs: 0.5 10*3/uL — ABNORMAL LOW (ref 0.7–4.0)
MCH: 33.7 pg (ref 26.0–34.0)
MCHC: 33.5 g/dL (ref 30.0–36.0)
MCV: 100.7 fL — AB (ref 78.0–100.0)
MONO ABS: 0.6 10*3/uL (ref 0.1–1.0)
MONOS PCT: 16 %
NEUTROS ABS: 2.4 10*3/uL (ref 1.7–7.7)
Neutrophils Relative %: 69 %
Platelets: 203 10*3/uL (ref 150–400)
RBC: 2.67 MIL/uL — ABNORMAL LOW (ref 4.22–5.81)
RDW: 18.9 % — AB (ref 11.5–15.5)
WBC: 3.6 10*3/uL — ABNORMAL LOW (ref 4.0–10.5)

## 2016-04-24 LAB — SAMPLE TO BLOOD BANK

## 2016-04-25 ENCOUNTER — Other Ambulatory Visit: Payer: Self-pay

## 2016-04-25 ENCOUNTER — Other Ambulatory Visit (HOSPITAL_COMMUNITY): Payer: Self-pay | Admitting: Emergency Medicine

## 2016-04-25 ENCOUNTER — Other Ambulatory Visit (HOSPITAL_COMMUNITY): Payer: Self-pay | Admitting: Oncology

## 2016-04-25 DIAGNOSIS — C3491 Malignant neoplasm of unspecified part of right bronchus or lung: Secondary | ICD-10-CM

## 2016-04-25 MED ORDER — ESCITALOPRAM OXALATE 10 MG PO TABS: 10.0000 mg | ORAL_TABLET | Freq: Every day | ORAL | Status: AC

## 2016-04-25 MED ORDER — CARVEDILOL 3.125 MG PO TABS: 3.1250 mg | ORAL_TABLET | Freq: Two times a day (BID) | ORAL | Status: DC

## 2016-04-25 MED ORDER — PANTOPRAZOLE SODIUM 40 MG PO TBEC: 40.0000 mg | DELAYED_RELEASE_TABLET | Freq: Every day | ORAL | Status: AC

## 2016-04-25 MED ORDER — RANITIDINE HCL 150 MG PO TABS: 150.0000 mg | ORAL_TABLET | Freq: Two times a day (BID) | ORAL | Status: AC

## 2016-04-25 NOTE — Telephone Encounter (Signed)
Refill complete 

## 2016-04-25 NOTE — Progress Notes (Signed)
Refills faxed to [facility]. Escitalopram '10mg'$  Pantoprazole '40mg'$  Ranitidine '150mg'$ 

## 2016-04-29 ENCOUNTER — Ambulatory Visit (HOSPITAL_COMMUNITY)
Admission: RE | Admit: 2016-04-29 | Discharge: 2016-04-29 | Disposition: A | Discharge status: Home / Self Care | Payer: Medicare PPO | Source: Ambulatory Visit | Attending: Hematology & Oncology | Admitting: Hematology & Oncology

## 2016-04-29 DIAGNOSIS — C3491 Malignant neoplasm of unspecified part of right bronchus or lung: Secondary | ICD-10-CM | POA: Insufficient documentation

## 2016-04-29 DIAGNOSIS — J432 Centrilobular emphysema: Secondary | ICD-10-CM | POA: Insufficient documentation

## 2016-04-29 MED ORDER — IOPAMIDOL (ISOVUE-300) INJECTION 61%
75.0000 mL | Freq: Once | INTRAVENOUS | Status: AC | PRN
  Administered 2016-04-29: 75 mL via INTRAVENOUS

## 2016-05-01 NOTE — Progress Notes (Signed)
Tyler Bogus, MD Tyler Aguilar [id number]  Squamous cell lung cancer, right [facility])  CURRENT THERAPY: Carboplatin/Abraxane day 1, 8, 15 every 28 day fashion beginning on 03/07/2016.  INTERVAL HISTORY: Tyler Aguilar 60 y.o. 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 Now on systemic chemotherapy consisting of Carboplatin/Abraxane beginning on 03/07/2016.    Squamous cell lung cancer ([place])   01/01/2016 Pathology Results EBUS FNA subcarinal mass, squamous cell carcinoma   01/11/2016 -  Radiation Therapy RT for palliation of central lung NSCLC at [facility] started on 01/11/2016   03/07/2016 -  Chemotherapy Carboplatin/Abraxane day 1, 8, 15 every 28 day   03/12/2016 Imaging CT chest- Pulmonary parenchymal pattern of peribronchovascular ground-glass, nodularity and consolidation is likely new or progressive from chest radiograph 02/27/2016 and may be due to an infectious bronchiolitis/bronchopneumonia.    04/29/2016 Imaging CT chest- Continued decreased size of the infiltrative subcarinal mass. Significant decrease in patchy  basilar-predominant nodular interlobular and peribronchovascular interstitial thickening, favoring decreasing lymphangitic tumor.    I personally reviewed and went over laboratory results with the patient.  The results are noted within this dictation.  I personally reviewed and went over radiographic studies with the patient.  The results are noted within this dictation.  CT of chest on 04/29/2016 demonstrates a positive response to therapy.  He continues to tolerate treatment well without any major complaints.  He denies any nausea or vomiting.  He denies any bowel habit changes.  His appetite is stable  and strong.  His weight is stable as well.  He is doing well from an oncology perspective.  Review of Systems  Constitutional: Negative.  Negative for fever, chills and weight loss.  HENT: Negative.  Negative for sore throat.   Eyes: Negative.  Negative for blurred vision and double vision.  Respiratory: Negative.  Negative for cough and shortness of breath.   Cardiovascular: Negative.  Negative for chest pain.  Gastrointestinal: Negative.  Negative for nausea, vomiting, abdominal pain, diarrhea and constipation.  Genitourinary: Negative.   Musculoskeletal: Negative.  Negative for falls.  Skin: Negative.   Neurological: Negative.  Negative for dizziness, seizures and loss of consciousness.  Endo/Heme/Allergies: Negative.   Psychiatric/Behavioral: Negative.     Past Medical History  Diagnosis Date  . 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 COPD (chronic obstructive pulmonary disease) ([place])   . AAA (abdominal aortic aneurysm) (HCC)     4.5 cm December 2016  . Dyslipidemia   . Lung cancer ([place])     Stage III squamous cell, right lung  . Cardiomyopathy ([place])     LVEF 35% February 2017 [facility]  . PAF (paroxysmal atrial fibrillation) (HCC)     Versus SVT documented February 2017 [facility]    Past Surgical History  Procedure Laterality Date  . Finger surgery Right 1979  . Colonoscopy  08/31/2012    Procedure: COLONOSCOPY;  Surgeon: Jamesetta So, MD;  Location: [facility] ENDO SUITE;  Service: Gastroenterology;  Laterality: N/A;  . Bronchoscopy    . Portacath placement Left 02/27/2016    Procedure: INSERTION PORT-A-CATH LEFT SUBCLAVIAN;  Surgeon: Aviva Signs, MD;  Location: [facility] ORS;  Service: General;  Laterality: Left;    Family History  Problem Relation Age of Onset  . Diabetes Mellitus II Mother   . Cancer Mother   . Cancer Father   . Coronary artery disease Brother     CABG    Social History   Social History    . Marital Status: Married    Spouse Name: N/A  . Number of Children: N/A  . Years of Education: N/A   Social History Main Topics  . Smoking status: Former Smoker    Types: Cigarettes    Quit date: 12/31/2015  . Smokeless tobacco: Not on file  . Alcohol Use: 0.0 oz/week    0 Standard drinks or equivalent per week  . Drug Use: No  . Sexual Activity: Not on file   Other Topics Concern  . Not on file   Social History Narrative     PHYSICAL EXAMINATION  ECOG PERFORMANCE STATUS: 0 - Asymptomatic  There were no vitals filed for this visit.  BP: 113/77 P 87 R 20 T 98.1F O2: 97% on RA  GENERAL:alert, no distress, well nourished, well developed, comfortable, cooperative, smiling and in chemo-recliner, accompanied by his sister. SKIN: skin color, texture, turgor are normal, no rashes or significant lesions HEAD: Normocephalic, No masses, lesions, tenderness or abnormalities EYES: normal, Conjunctiva are pink and non-injected EARS: External ears normal OROPHARYNX:lips, buccal mucosa, and tongue normal and mucous membranes are moist  NECK: supple, trachea midline LYMPH:  no palpable lymphadenopathy BREAST:not examined LUNGS: clear to auscultation and percussion HEART: regular rate & rhythm, no murmurs, no gallops, S1 normal and S2 normal ABDOMEN:abdomen soft, non-tender and normal bowel sounds BACK: Back symmetric, no curvature. EXTREMITIES:less then 2 second capillary refill, no joint deformities, effusion, or inflammation, no skin discoloration, no cyanosis  NEURO: alert & oriented x 3 with fluent speech, no focal motor/sensory deficits, gait normal   LABORATORY DATA: CBC    Component Value Date/Time   WBC 5.7 05/02/2016 0827   RBC 2.73* 05/02/2016 0827   HGB 9.4* 05/02/2016 0827   HCT 27.5* 05/02/2016 0827   PLT 216 05/02/2016 0827   MCV 100.7* 05/02/2016 0827   MCH 34.4* 05/02/2016 0827   MCHC 34.2 05/02/2016 0827   RDW 19.3* 05/02/2016 0827   LYMPHSABS 0.6*  05/02/2016 0827   MONOABS 0.9 05/02/2016 0827   EOSABS 0.0 05/02/2016 0827   BASOSABS 0.0 05/02/2016 0827      Chemistry      Component Value Date/Time   NA 134* 05/02/2016 0827   K 4.0 05/02/2016 0827   CL 101 05/02/2016 0827   CO2 26 05/02/2016 0827   BUN 7 05/02/2016 0827   CREATININE 0.59* 05/02/2016 0827      Component Value Date/Time   CALCIUM 8.9 05/02/2016 0827   ALKPHOS 63 05/02/2016 0827   AST 22 05/02/2016 0827   ALT 19 05/02/2016 0827   BILITOT 0.6 05/02/2016 0827        PENDING LABS:   RADIOGRAPHIC STUDIES:  Ct Chest W Contrast  04/29/2016  CLINICAL DATA:  Right lung squamous cell carcinoma presenting for restaging status post chemotherapy and radiation therapy. Shortness of breath. Patient received emergent radiation therapy for airway compromise due to the subcarinal mass at the time of diagnosis on 01/11/2016. EXAM: CT CHEST WITH CONTRAST TECHNIQUE: Multidetector CT imaging of the chest was performed during intravenous contrast administration. CONTRAST:  9m ISOVUE-300 IOPAMIDOL (ISOVUE-300) INJECTION 61% COMPARISON:  03/12/2016 chest CT . FINDINGS: Mediastinum/Nodes: Normal heart size. Trace  pericardial fluid/thickening is stable. Left subclavian Port-A-Cath terminates in the lower third of the superior vena cava. Atherosclerotic nonaneurysmal thoracic aorta. Normal caliber pulmonary arteries. No central pulmonary emboli. Normal visualized thyroid. Nonspecific mild circumferential wall thickening in the mid thoracic esophagus, not definitely changed. No axillary adenopathy. The infiltrative subcarinal mass measures 2.7 x 1.8 cm (series 2/ image 73), decreased from 3.1 x 2.5 cm. No new pathologically enlarged mediastinal or hilar nodes. Lungs/Pleura: No pneumothorax. No pleural effusion. Mild centrilobular emphysema and diffuse bronchial wall thickening. There is nodular interlobular septal thickening and patchy nodular interstitial thickening of the  peribronchovascular and centrilobular interstitium predominantly in the lower lobes, lingula and basilar upper lobes, significantly decreased from 03/12/2016. Areas of more confluent consolidation in the medial lower lobes have also decreased. No new consolidative airspace disease. Upper abdomen: Two subcentimeter hypodense lesions in the visualized liver, too small to characterize, unchanged. Musculoskeletal:  No aggressive appearing focal osseous lesions. IMPRESSION: 1. Continued decreased size of the infiltrative subcarinal mass. 2. Significant decrease in patchy basilar-predominant nodular interlobular and peribronchovascular interstitial thickening, favoring decreasing lymphangitic tumor. 3. No new or progressive metastatic disease in the chest. 4. Stable mild circumferential wall thickening in the mid thoracic esophagus, probably treatment related. 5. Mild centrilobular emphysema and diffuse bronchial wall thickening, suggesting COPD. Electronically Signed   By: Ilona Sorrel M.D.   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  Now on systemic chemotherapy consisting of Carboplatin/Abraxane beginning on 03/07/2016.  Oncology history is up to date.  Pre-treatment labs as ordered today.    CT scan of chest on 04/29/2016 demonstrates a positive response to therapy.  Given recent imaging results, lymphangitic spread of tumor is more likely which would change the patient's treatment plan moving forward.  [name] will talk with the patient's sister regarding this as the patient has a difficult time comprehending complicated material.  She has broached this topic with the patient's sister.  He is  tolerating treatment well.  His weight is stable.  He denies any nausea/vomiting. His appetite is strong.  I have requested our Nurse navigator ascertain ALK/EGFR/PDL1 testing from his specimen at [facility].  Return in 2 weeks for treatment and follow-up appointment.    ORDERS PLACED FOR THIS ENCOUNTER: No orders of the defined types were placed in this encounter.    MEDICATIONS PRESCRIBED THIS ENCOUNTER: No orders of the defined types were placed in this encounter.    THERAPY PLAN:  Continue treatment as planned.  All questions were answered. The patient knows to call the clinic with any problems, questions or concerns. We can certainly see the patient much sooner if necessary.  Patient and plan discussed with Dr. Ancil Linsey and she is in agreement with the aforementioned.   This note is electronically signed by: Doy Mince 05/02/2016 9:01 AM

## 2016-05-01 NOTE — Assessment & Plan Note (Addendum)
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  Now on systemic chemotherapy consisting of Carboplatin/Abraxane beginning on 03/07/2016.  Oncology history is up to date.  Pre-treatment labs as ordered today.    CT scan of chest on 04/29/2016 demonstrates a positive response to therapy.  Given recent imaging results, lymphangitic spread of tumor is more likely which would change the patient's treatment plan moving forward.  [name] will talk with the patient's sister regarding this as the patient has a difficult time comprehending complicated material.  She has broached this topic with the patient's sister.  He is tolerating treatment well.  His weight is stable.  He denies any nausea/vomiting. His appetite is strong.  I have requested our Nurse navigator ascertain ALK/EGFR/PDL1 testing from his specimen at [facility].  Return in 2 weeks for treatment and follow-up appointment.

## 2016-05-02 ENCOUNTER — Encounter (HOSPITAL_COMMUNITY): Payer: Self-pay

## 2016-05-02 ENCOUNTER — Encounter (HOSPITAL_BASED_OUTPATIENT_CLINIC_OR_DEPARTMENT_OTHER): Payer: Medicare PPO

## 2016-05-02 ENCOUNTER — Inpatient Hospital Stay (HOSPITAL_COMMUNITY): Payer: Medicare HMO

## 2016-05-02 ENCOUNTER — Encounter (HOSPITAL_BASED_OUTPATIENT_CLINIC_OR_DEPARTMENT_OTHER): Payer: Medicare PPO | Admitting: Oncology

## 2016-05-02 ENCOUNTER — Telehealth (HOSPITAL_COMMUNITY): Payer: Self-pay | Admitting: *Deleted

## 2016-05-02 VITALS — BP 130/77 | HR 79 | Temp 97.6°F | Resp 18 | Wt 145.0 lb

## 2016-05-02 DIAGNOSIS — C3491 Malignant neoplasm of unspecified part of right bronchus or lung: Secondary | ICD-10-CM

## 2016-05-02 DIAGNOSIS — Z5111 Encounter for antineoplastic chemotherapy: Secondary | ICD-10-CM | POA: Diagnosis not present

## 2016-05-02 LAB — COMPREHENSIVE METABOLIC PANEL
ALK PHOS: 63 U/L (ref 38–126)
ALT: 19 U/L (ref 17–63)
AST: 22 U/L (ref 15–41)
Albumin: 3.4 g/dL — ABNORMAL LOW (ref 3.5–5.0)
Anion gap: 7 (ref 5–15)
BILIRUBIN TOTAL: 0.6 mg/dL (ref 0.3–1.2)
BUN: 7 mg/dL (ref 6–20)
CO2: 26 mmol/L (ref 22–32)
CREATININE: 0.59 mg/dL — AB (ref 0.61–1.24)
Calcium: 8.9 mg/dL (ref 8.9–10.3)
Chloride: 101 mmol/L (ref 101–111)
GFR calc Af Amer: >60 mL/min (ref >60)
Glucose, Bld: 87 mg/dL (ref 65–99)
Potassium: 4 mmol/L (ref 3.5–5.1)
Sodium: 134 mmol/L — ABNORMAL LOW (ref 135–145)
TOTAL PROTEIN: 6.8 g/dL (ref 6.5–8.1)

## 2016-05-02 LAB — CBC WITH DIFFERENTIAL/PLATELET
BASOS ABS: 0 10*3/uL (ref 0.0–0.1)
Basophils Relative: 0 %
Eosinophils Absolute: 0 10*3/uL (ref 0.0–0.7)
Eosinophils Relative: 1 %
HEMATOCRIT: 27.5 % — AB (ref 39.0–52.0)
Hemoglobin: 9.4 g/dL — ABNORMAL LOW (ref 13.0–17.0)
LYMPHS PCT: 10 %
Lymphs Abs: 0.6 10*3/uL — ABNORMAL LOW (ref 0.7–4.0)
MCH: 34.4 pg — ABNORMAL HIGH (ref 26.0–34.0)
MCHC: 34.2 g/dL (ref 30.0–36.0)
MCV: 100.7 fL — AB (ref 78.0–100.0)
MONO ABS: 0.9 10*3/uL (ref 0.1–1.0)
Monocytes Relative: 15 %
NEUTROS ABS: 4.1 10*3/uL (ref 1.7–7.7)
Neutrophils Relative %: 74 %
Platelets: 216 10*3/uL (ref 150–400)
RBC: 2.73 MIL/uL — AB (ref 4.22–5.81)
RDW: 19.3 % — AB (ref 11.5–15.5)
WBC: 5.7 10*3/uL (ref 4.0–10.5)

## 2016-05-02 MED ORDER — HEPARIN SOD (PORK) LOCK FLUSH 100 UNIT/ML IV SOLN
INTRAVENOUS | Status: AC
  Filled 2016-05-02: qty 5

## 2016-05-02 MED ORDER — HEPARIN SOD (PORK) LOCK FLUSH 100 UNIT/ML IV SOLN
500.0000 [IU] | Freq: Once | INTRAVENOUS | Status: AC | PRN
  Administered 2016-05-02: 500 [IU]
  Filled 2016-05-02: qty 5

## 2016-05-02 MED ORDER — SODIUM CHLORIDE 0.9% FLUSH: 10.0000 mL | INTRAVENOUS | Status: DC | PRN

## 2016-05-02 MED ORDER — SODIUM CHLORIDE 0.9 % IV SOLN
10.0000 mg | Freq: Once | INTRAVENOUS | Status: AC
  Administered 2016-05-02: 10 mg via INTRAVENOUS
  Filled 2016-05-02: qty 1

## 2016-05-02 MED ORDER — CARBOPLATIN CHEMO INJECTION 600 MG/60ML
660.0000 mg | Freq: Once | INTRAVENOUS | Status: AC
  Administered 2016-05-02: 660 mg via INTRAVENOUS
  Filled 2016-05-02: qty 66

## 2016-05-02 MED ORDER — PALONOSETRON HCL INJECTION 0.25 MG/5ML
0.2500 mg | Freq: Once | INTRAVENOUS | Status: AC
  Administered 2016-05-02: 0.25 mg via INTRAVENOUS
  Filled 2016-05-02: qty 5

## 2016-05-02 MED ORDER — SODIUM CHLORIDE 0.9 % IV SOLN
Freq: Once | INTRAVENOUS | Status: AC
  Administered 2016-05-02: 09:00:00 via INTRAVENOUS

## 2016-05-02 MED ORDER — PACLITAXEL PROTEIN-BOUND CHEMO INJECTION 100 MG
100.0000 mg/m2 | Freq: Once | INTRAVENOUS | Status: AC
  Administered 2016-05-02: 175 mg via INTRAVENOUS
  Filled 2016-05-02: qty 35

## 2016-05-02 MED ORDER — LORAZEPAM 2 MG/ML IJ SOLN
0.5000 mg | Freq: Once | INTRAMUSCULAR | Status: AC | PRN
  Administered 2016-05-02: 0.5 mg via INTRAVENOUS
  Filled 2016-05-02: qty 1

## 2016-05-02 NOTE — Telephone Encounter (Signed)
Received the PDL1 status on Martie's tumor from [facility] and have requested ALK and EGFR from [facility] on same tumor. This request was faxed to [facility] today 05-02-16 @ 1615.

## 2016-05-02 NOTE — Patient Instructions (Signed)
[facility] Discharge Instructions for Patients Receiving Chemotherapy   Beginning January 23rd 2017 lab work for the [facility] will be done in the  Main lab at [facility] on 1st floor. If you have a lab appointment with the [facility] please come in thru the  Main Entrance and check in at the main information desk   Today you received the following chemotherapy agents:  Abraxane and carboplatin  If you develop nausea and vomiting, or diarrhea that is not controlled by your medication, call the clinic.  The clinic phone number is [phone]. Office hours are Monday-Friday 8:30am-5:00pm.  BELOW ARE SYMPTOMS THAT SHOULD BE REPORTED IMMEDIATELY:  *FEVER GREATER THAN 101.0 F  *CHILLS WITH OR WITHOUT FEVER  NAUSEA AND VOMITING THAT IS NOT CONTROLLED WITH YOUR NAUSEA MEDICATION  *UNUSUAL SHORTNESS OF BREATH  *UNUSUAL BRUISING OR BLEEDING  TENDERNESS IN MOUTH AND THROAT WITH OR WITHOUT PRESENCE OF ULCERS  *URINARY PROBLEMS  *BOWEL PROBLEMS  UNUSUAL RASH Items with * indicate a potential emergency and should be followed up as soon as possible. If you have an emergency after office hours please contact your primary care physician or go to the nearest emergency department.  Please call the clinic during office hours if you have any questions or concerns.   You may also contact the Patient Navigator at [phone] should you have any questions or need assistance in obtaining follow up care.      Resources For Cancer Patients and their Caregivers ? American Cancer Society: Can assist with transportation, wigs, general needs, runs Look Good Feel Better.        ([phone] ? Cancer Care: Provides financial assistance, online support groups, medication/co-pay assistance.  1-800-813-HOPE (787)879-1186) ? [place] Assists [place] cancer patients and their families through emotional , educational and financial support.   413-429-8562 ? [place] DSS Where to apply for food stamps, Medicaid and utility assistance. 938-018-1877 ? RCATS: Transportation to medical appointments. ([phone] ? Social Security Administration: May apply for disability if have a Stage IV cancer. [phone] 825-245-0017 ? [employer], Disability and Transit Services: Assists with nutrition, care and transit needs. [phone]

## 2016-05-02 NOTE — Patient Instructions (Signed)
[place] at [facility] Discharge Instructions  RECOMMENDATIONS MADE BY THE CONSULTANT AND ANY TEST RESULTS WILL BE SENT TO YOUR REFERRING PHYSICIAN.  CT of chest is much improved. We will plan on continuing treatment as planned. Return as planned for follow-up and treatment.  Thank you for choosing [place] at [facility] to provide your oncology and hematology care.  To afford each patient quality time with our provider, please arrive at least 15 minutes before your scheduled appointment time.   Beginning January 23rd 2017 lab work for the [facility] will be done in the  Main lab at [facility] on 1st floor. If you have a lab appointment with the [facility] please come in thru the  Main Entrance and check in at the main information desk  You need to re-schedule your appointment should you arrive 10 or more minutes late.  We strive to give you quality time with our providers, and arriving late affects you and other patients whose appointments are after yours.  Also, if you no show three or more times for appointments you may be dismissed from the clinic at the providers discretion.     Again, thank you for choosing [facility].  Our hope is that these requests will decrease the amount of time that you wait before being seen by our physicians.       _____________________________________________________________  Should you have questions after your visit to [facility], please contact our office at [phone] between the hours of 8:30 a.m. and 4:30 p.m.  Voicemails left after 4:30 p.m. will not be returned until the following business day.  For prescription refill requests, have your pharmacy contact our office.         Resources For Cancer Patients and their Caregivers ? American Cancer Society: Can assist with transportation, wigs, general needs, runs Look Good Feel Better.         ([phone] ? Cancer Care: Provides financial assistance, online support groups, medication/co-pay assistance.  1-800-813-HOPE 450-281-5116) ? [place] Assists [place] cancer patients and their families through emotional , educational and financial support.  ([phone] ? [place] DSS Where to apply for food stamps, Medicaid and utility assistance. 8380522686 ? RCATS: Transportation to medical appointments. 2512833430 ? Social Security Administration: May apply for disability if have a Stage IV cancer. 6472850528 519-818-6968 ? [employer], Disability and Transit Services: Assists with nutrition, care and transit needs. [place] Support Programs: '@10RELATIVEDAYS'$ @ > Cancer Support Group  2nd Tuesday of the month 1pm-2pm, Journey Room  > Creative Journey  3rd Tuesday of the month 1130am-1pm, Journey Room  > Look Good Feel Better  1st Wednesday of the month 10am-12 noon, Journey Room (Call [place] to register [phone])

## 2016-05-02 NOTE — Progress Notes (Signed)
1145: Tolerated tx w/o adverse reaction. A&Ox4, in no distress.  VSS.  Discharged ambulatory.

## 2016-05-05 ENCOUNTER — Other Ambulatory Visit (HOSPITAL_COMMUNITY): Payer: Self-pay | Admitting: Oncology

## 2016-05-09 ENCOUNTER — Encounter (HOSPITAL_BASED_OUTPATIENT_CLINIC_OR_DEPARTMENT_OTHER): Payer: Medicare PPO

## 2016-05-09 ENCOUNTER — Inpatient Hospital Stay (HOSPITAL_COMMUNITY): Payer: Medicare HMO

## 2016-05-09 ENCOUNTER — Encounter (HOSPITAL_COMMUNITY): Payer: Self-pay

## 2016-05-09 VITALS — BP 104/78 | HR 94 | Temp 97.3°F | Resp 18 | Wt 145.4 lb

## 2016-05-09 DIAGNOSIS — C3490 Malignant neoplasm of unspecified part of unspecified bronchus or lung: Secondary | ICD-10-CM

## 2016-05-09 DIAGNOSIS — Z5111 Encounter for antineoplastic chemotherapy: Secondary | ICD-10-CM

## 2016-05-09 DIAGNOSIS — C3491 Malignant neoplasm of unspecified part of right bronchus or lung: Secondary | ICD-10-CM | POA: Diagnosis not present

## 2016-05-09 LAB — COMPREHENSIVE METABOLIC PANEL
ALT: 22 U/L (ref 17–63)
ANION GAP: 9 (ref 5–15)
AST: 24 U/L (ref 15–41)
Albumin: 3.5 g/dL (ref 3.5–5.0)
Alkaline Phosphatase: 66 U/L (ref 38–126)
BUN: 14 mg/dL (ref 6–20)
CALCIUM: 9 mg/dL (ref 8.9–10.3)
CHLORIDE: 98 mmol/L — AB (ref 101–111)
CO2: 26 mmol/L (ref 22–32)
Creatinine, Ser: 0.6 mg/dL — ABNORMAL LOW (ref 0.61–1.24)
GFR calc non Af Amer: >60 mL/min (ref >60)
Glucose, Bld: 114 mg/dL — ABNORMAL HIGH (ref 65–99)
Potassium: 3.7 mmol/L (ref 3.5–5.1)
SODIUM: 133 mmol/L — AB (ref 135–145)
Total Bilirubin: 0.6 mg/dL (ref 0.3–1.2)
Total Protein: 7.1 g/dL (ref 6.5–8.1)

## 2016-05-09 LAB — CBC WITH DIFFERENTIAL/PLATELET
Basophils Absolute: 0 10*3/uL (ref 0.0–0.1)
Basophils Relative: 0 %
EOS ABS: 0 10*3/uL (ref 0.0–0.7)
EOS PCT: 0 %
HCT: 27.2 % — ABNORMAL LOW (ref 39.0–52.0)
Hemoglobin: 9 g/dL — ABNORMAL LOW (ref 13.0–17.0)
LYMPHS ABS: 0.5 10*3/uL — AB (ref 0.7–4.0)
Lymphocytes Relative: 9 %
MCH: 33 pg (ref 26.0–34.0)
MCHC: 33.1 g/dL (ref 30.0–36.0)
MCV: 99.6 fL (ref 78.0–100.0)
MONOS PCT: 7 %
Monocytes Absolute: 0.4 10*3/uL (ref 0.1–1.0)
Neutro Abs: 5.2 10*3/uL (ref 1.7–7.7)
Neutrophils Relative %: 84 %
PLATELETS: 248 10*3/uL (ref 150–400)
RBC: 2.73 MIL/uL — ABNORMAL LOW (ref 4.22–5.81)
RDW: 18.3 % — ABNORMAL HIGH (ref 11.5–15.5)
WBC: 6.1 10*3/uL (ref 4.0–10.5)

## 2016-05-09 MED ORDER — HEPARIN SOD (PORK) LOCK FLUSH 100 UNIT/ML IV SOLN
500.0000 [IU] | Freq: Once | INTRAVENOUS | Status: AC | PRN
  Administered 2016-05-09: 500 [IU]
  Filled 2016-05-09: qty 5

## 2016-05-09 MED ORDER — SODIUM CHLORIDE 0.9% FLUSH: 10.0000 mL | INTRAVENOUS | Status: DC | PRN

## 2016-05-09 MED ORDER — PACLITAXEL PROTEIN-BOUND CHEMO INJECTION 100 MG
100.0000 mg/m2 | Freq: Once | INTRAVENOUS | Status: AC
  Administered 2016-05-09: 175 mg via INTRAVENOUS
  Filled 2016-05-09: qty 35

## 2016-05-09 MED ORDER — PALONOSETRON HCL INJECTION 0.25 MG/5ML
0.2500 mg | Freq: Once | INTRAVENOUS | Status: AC
Start: 2016-05-09 — End: 2016-05-09
  Administered 2016-05-09: 0.25 mg via INTRAVENOUS
  Filled 2016-05-09: qty 5

## 2016-05-09 MED ORDER — LORAZEPAM 2 MG/ML IJ SOLN
0.5000 mg | Freq: Once | INTRAMUSCULAR | Status: AC
  Administered 2016-05-09: 0.5 mg via INTRAVENOUS
  Filled 2016-05-09: qty 1

## 2016-05-09 MED ORDER — SODIUM CHLORIDE 0.9 % IV SOLN
Freq: Once | INTRAVENOUS | Status: AC
  Administered 2016-05-09: 12:00:00 via INTRAVENOUS

## 2016-05-09 NOTE — Progress Notes (Signed)
Tyler Aguilar Tolerated chemotherapy well  Discharged ambulatory

## 2016-05-09 NOTE — Patient Instructions (Signed)
[place] at [facility] Discharge Instructions  RECOMMENDATIONS MADE BY THE CONSULTANT AND ANY TEST RESULTS WILL BE SENT TO YOUR REFERRING PHYSICIAN.  abraxene today Follow up as scheduled Please call the clinic if you have any questions or concerns   Thank you for choosing [place] at [facility] to provide your oncology and hematology care.  To afford each patient quality time with our provider, please arrive at least 15 minutes before your scheduled appointment time.   Beginning January 23rd 2017 lab work for the [facility] will be done in the  Main lab at [facility] on 1st floor. If you have a lab appointment with the [facility] please come in thru the  Main Entrance and check in at the main information desk  You need to re-schedule your appointment should you arrive 10 or more minutes late.  We strive to give you quality time with our providers, and arriving late affects you and other patients whose appointments are after yours.  Also, if you no show three or more times for appointments you may be dismissed from the clinic at the providers discretion.     Again, thank you for choosing [facility].  Our hope is that these requests will decrease the amount of time that you wait before being seen by our physicians.       _____________________________________________________________  Should you have questions after your visit to [facility], please contact our office at [phone] between the hours of 8:30 a.m. and 4:30 p.m.  Voicemails left after 4:30 p.m. will not be returned until the following business day.  For prescription refill requests, have your pharmacy contact our office.         Resources For Cancer Patients and their Caregivers ? American Cancer Society: Can assist with transportation, wigs, general needs, runs Look Good Feel Better.        (272)734-0539 ? Cancer Care: Provides  financial assistance, online support groups, medication/co-pay assistance.  1-800-813-HOPE (367)205-3796) ? [place] Assists [place] cancer patients and their families through emotional , educational and financial support.  (604)050-9965 ? [place] DSS Where to apply for food stamps, Medicaid and utility assistance. 9894797683 ? [employer]: Transportation to medical appointments. (435)142-2412 ? Social Security Administration: May apply for disability if have a Stage IV cancer. 445-633-3943 564 361 4637 ? [employer], Disability and Transit Services: Assists with nutrition, care and transit needs. [place] Support Programs: '@10RELATIVEDAYS'$ @ > Cancer Support Group  2nd Tuesday of the month 1pm-2pm, Journey Room  > Creative Journey  3rd Tuesday of the month 1130am-1pm, Journey Room  > Look Good Feel Better  1st Wednesday of the month 10am-12 noon, Journey Room (Call [place] to register [phone])

## 2016-05-13 ENCOUNTER — Other Ambulatory Visit: Payer: Self-pay | Admitting: *Deleted

## 2016-05-13 MED ORDER — CARVEDILOL 3.125 MG PO TABS: 3.1250 mg | ORAL_TABLET | Freq: Two times a day (BID) | ORAL | Status: AC

## 2016-05-16 ENCOUNTER — Encounter (HOSPITAL_BASED_OUTPATIENT_CLINIC_OR_DEPARTMENT_OTHER): Payer: Medicare PPO | Admitting: Hematology & Oncology

## 2016-05-16 ENCOUNTER — Encounter (HOSPITAL_COMMUNITY): Payer: Medicare PPO | Attending: Hematology & Oncology

## 2016-05-16 ENCOUNTER — Inpatient Hospital Stay (HOSPITAL_COMMUNITY): Payer: Medicare HMO

## 2016-05-16 ENCOUNTER — Encounter (HOSPITAL_COMMUNITY): Payer: Self-pay | Admitting: Hematology & Oncology

## 2016-05-16 VITALS — BP 119/68 | HR 105 | Temp 98.8°F | Resp 20 | Wt 145.0 lb

## 2016-05-16 VITALS — BP 119/80 | HR 82 | Temp 98.0°F | Resp 20

## 2016-05-16 DIAGNOSIS — R938 Abnormal findings on diagnostic imaging of other specified body structures: Secondary | ICD-10-CM | POA: Diagnosis present

## 2016-05-16 DIAGNOSIS — T451X5A Adverse effect of antineoplastic and immunosuppressive drugs, initial encounter: Secondary | ICD-10-CM

## 2016-05-16 DIAGNOSIS — R042 Hemoptysis: Secondary | ICD-10-CM | POA: Diagnosis present

## 2016-05-16 DIAGNOSIS — C3491 Malignant neoplasm of unspecified part of right bronchus or lung: Secondary | ICD-10-CM | POA: Diagnosis present

## 2016-05-16 DIAGNOSIS — M25552 Pain in left hip: Secondary | ICD-10-CM

## 2016-05-16 DIAGNOSIS — C3490 Malignant neoplasm of unspecified part of unspecified bronchus or lung: Secondary | ICD-10-CM

## 2016-05-16 DIAGNOSIS — D6481 Anemia due to antineoplastic chemotherapy: Secondary | ICD-10-CM

## 2016-05-16 DIAGNOSIS — M25512 Pain in left shoulder: Secondary | ICD-10-CM

## 2016-05-16 DIAGNOSIS — Z5111 Encounter for antineoplastic chemotherapy: Secondary | ICD-10-CM

## 2016-05-16 DIAGNOSIS — D709 Neutropenia, unspecified: Secondary | ICD-10-CM

## 2016-05-16 DIAGNOSIS — D630 Anemia in neoplastic disease: Secondary | ICD-10-CM | POA: Diagnosis not present

## 2016-05-16 DIAGNOSIS — M545 Low back pain, unspecified: Secondary | ICD-10-CM

## 2016-05-16 DIAGNOSIS — R05 Cough: Secondary | ICD-10-CM | POA: Diagnosis present

## 2016-05-16 DIAGNOSIS — Z8673 Personal history of transient ischemic attack (TIA), and cerebral infarction without residual deficits: Secondary | ICD-10-CM

## 2016-05-16 LAB — COMPREHENSIVE METABOLIC PANEL
ALT: 22 U/L (ref 17–63)
ANION GAP: 8 (ref 5–15)
AST: 25 U/L (ref 15–41)
Albumin: 3.4 g/dL — ABNORMAL LOW (ref 3.5–5.0)
Alkaline Phosphatase: 64 U/L (ref 38–126)
BUN: 9 mg/dL (ref 6–20)
CHLORIDE: 102 mmol/L (ref 101–111)
CO2: 26 mmol/L (ref 22–32)
CREATININE: 0.65 mg/dL (ref 0.61–1.24)
Calcium: 9.1 mg/dL (ref 8.9–10.3)
GFR calc non Af Amer: >60 mL/min (ref >60)
Glucose, Bld: 117 mg/dL — ABNORMAL HIGH (ref 65–99)
POTASSIUM: 3.9 mmol/L (ref 3.5–5.1)
SODIUM: 136 mmol/L (ref 135–145)
Total Bilirubin: 0.6 mg/dL (ref 0.3–1.2)
Total Protein: 6.7 g/dL (ref 6.5–8.1)

## 2016-05-16 LAB — CBC WITH DIFFERENTIAL/PLATELET
Basophils Absolute: 0 10*3/uL (ref 0.0–0.1)
Basophils Relative: 1 %
EOS ABS: 0 10*3/uL (ref 0.0–0.7)
EOS PCT: 1 %
HCT: 25.6 % — ABNORMAL LOW (ref 39.0–52.0)
Hemoglobin: 8.6 g/dL — ABNORMAL LOW (ref 13.0–17.0)
LYMPHS ABS: 0.3 10*3/uL — AB (ref 0.7–4.0)
Lymphocytes Relative: 15 %
MCH: 33.9 pg (ref 26.0–34.0)
MCHC: 33.6 g/dL (ref 30.0–36.0)
MCV: 100.8 fL — ABNORMAL HIGH (ref 78.0–100.0)
Monocytes Absolute: 0.4 10*3/uL (ref 0.1–1.0)
Monocytes Relative: 20 %
Neutro Abs: 1.4 10*3/uL — ABNORMAL LOW (ref 1.7–7.7)
Neutrophils Relative %: 65 %
PLATELETS: 203 10*3/uL (ref 150–400)
RBC: 2.54 MIL/uL — AB (ref 4.22–5.81)
RDW: 18.6 % — ABNORMAL HIGH (ref 11.5–15.5)
WBC: 2.2 10*3/uL — AB (ref 4.0–10.5)

## 2016-05-16 MED ORDER — LORAZEPAM 2 MG/ML IJ SOLN
0.5000 mg | Freq: Once | INTRAMUSCULAR | Status: AC
  Administered 2016-05-16: 0.5 mg via INTRAVENOUS
  Filled 2016-05-16: qty 1

## 2016-05-16 MED ORDER — SODIUM CHLORIDE 0.9% FLUSH
10.0000 mL | INTRAVENOUS | Status: DC | PRN
  Administered 2016-05-16: 10 mL
  Filled 2016-05-16: qty 10

## 2016-05-16 MED ORDER — PEGFILGRASTIM 6 MG/0.6ML ~~LOC~~ PSKT: 6.0000 mg | PREFILLED_SYRINGE | Freq: Once | SUBCUTANEOUS | Status: DC

## 2016-05-16 MED ORDER — PALONOSETRON HCL INJECTION 0.25 MG/5ML
0.2500 mg | Freq: Once | INTRAVENOUS | Status: AC
  Administered 2016-05-16: 0.25 mg via INTRAVENOUS
  Filled 2016-05-16: qty 5

## 2016-05-16 MED ORDER — HEPARIN SOD (PORK) LOCK FLUSH 100 UNIT/ML IV SOLN
500.0000 [IU] | Freq: Once | INTRAVENOUS | Status: AC | PRN
  Administered 2016-05-16: 500 [IU]
  Filled 2016-05-16: qty 5

## 2016-05-16 MED ORDER — LORAZEPAM 2 MG/ML IJ SOLN: 0.5000 mg | Freq: Once | INTRAMUSCULAR | Status: DC | PRN

## 2016-05-16 MED ORDER — PACLITAXEL PROTEIN-BOUND CHEMO INJECTION 100 MG
100.0000 mg/m2 | Freq: Once | INTRAVENOUS | Status: AC
  Administered 2016-05-16: 175 mg via INTRAVENOUS
  Filled 2016-05-16: qty 35

## 2016-05-16 MED ORDER — SODIUM CHLORIDE 0.9 % IV SOLN
Freq: Once | INTRAVENOUS | Status: AC
  Administered 2016-05-16: 11:00:00 via INTRAVENOUS

## 2016-05-16 NOTE — Patient Instructions (Signed)
[facility] Discharge Instructions for Patients Receiving Chemotherapy   Beginning January 23rd 2017 lab work for the [facility] will be done in the  Main lab at [facility] on 1st floor. If you have a lab appointment with the [facility] please come in thru the  Main Entrance and check in at the main information desk   Today you received the following chemotherapy agents:  Abraxane  If you develop nausea and vomiting, or diarrhea that is not controlled by your medication, call the clinic.  The clinic phone number is [phone]. Office hours are Monday-Friday 8:30am-5:00pm.  BELOW ARE SYMPTOMS THAT SHOULD BE REPORTED IMMEDIATELY:  *FEVER GREATER THAN 101.0 F  *CHILLS WITH OR WITHOUT FEVER  NAUSEA AND VOMITING THAT IS NOT CONTROLLED WITH YOUR NAUSEA MEDICATION  *UNUSUAL SHORTNESS OF BREATH  *UNUSUAL BRUISING OR BLEEDING  TENDERNESS IN MOUTH AND THROAT WITH OR WITHOUT PRESENCE OF ULCERS  *URINARY PROBLEMS  *BOWEL PROBLEMS  UNUSUAL RASH Items with * indicate a potential emergency and should be followed up as soon as possible. If you have an emergency after office hours please contact your primary care physician or go to the nearest emergency department.  Please call the clinic during office hours if you have any questions or concerns.   You may also contact the Patient Navigator at [phone] should you have any questions or need assistance in obtaining follow up care.      Resources For Cancer Patients and their Caregivers ? American Cancer Society: Can assist with transportation, wigs, general needs, runs Look Good Feel Better.        5100610180 ? Cancer Care: Provides financial assistance, online support groups, medication/co-pay assistance.  1-800-813-HOPE 910-550-5748) ? [place] Assists [place] cancer patients and their families through emotional , educational and financial support.   [phone] ? [place] DSS Where to apply for food stamps, Medicaid and utility assistance. 3095316092 ? RCATS: Transportation to medical appointments. 838-127-7064 ? Social Security Administration: May apply for disability if have a Stage IV cancer. ([phone] (929) [phone] ? [employer], Disability and Transit Services: Assists with nutrition, care and transit needs. [phone]

## 2016-05-16 NOTE — Progress Notes (Signed)
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) EBUS FNA subcarinal mass, squamous cell carcinoma 01/01/2016 RT for palliation of central lung NSCLC at [facility] started on 01/11/2016 Dysphagis, mild oral and mild moderate pharyngeal dysphagia Paroxysmal afib with EF 35% on ECHO 01/22/2016 Intractable hiccups on Baclofen 10 mg po tid Anemia, chronic disease Hyponatremia Acute hypoxic respiratory failure, COPD History of CVA with residual L sided weakness Urgent XRT secondary to airway compromise from NSCLC    Squamous cell lung cancer ([place])   01/01/2016 Pathology Results EBUS FNA subcarinal mass, squamous cell carcinoma   01/11/2016 -  Radiation Therapy RT for palliation of central lung NSCLC at [place] started on 01/11/2016   01/17/2016 Pathology Results PD-L1 shows high expression- proportion score of 50- 60%   03/07/2016 -  Chemotherapy Carboplatin/Abraxane day 1, 8, 15 every 28 day   03/12/2016 Imaging CT chest- Pulmonary parenchymal pattern of peribronchovascular ground-glass, nodularity and consolidation is likely new or progressive from chest radiograph 02/27/2016 and may be due to an infectious bronchiolitis/bronchopneumonia.    04/29/2016 Imaging CT chest- Continued decreased size of the infiltrative subcarinal mass. Significant decrease in patchy  basilar-predominant nodular interlobular and peribronchovascular interstitial thickening, favoring decreasing lymphangitic tumor.     HISTORY OF PRESENTING ILLNESS:  Tyler Aguilar 60 y.o. male is here because of clinically staged III (T1N2M0) Squamous cell carcinoma of the R lung.He is here for ongoing chemotherapy.   Mr. Vonbehren returns to the [facility]  today accompanied by his sister.  He says that his leg is hurting today, "all of a sudden," and that it just recently started bothering him. "Not every day." He thinks it hurts worse when he's doing his chemo. Yesterday evening, he notes that it "got to hurting so bad I couldn't hardly walk on it." He notes that the pain runs through from his left hip all the way down his left thigh. He says he has not had this pain before. [facility] but doesn't "stop" it. He confirms just taking one of these as-needed. He says the only way he can get it to stop is if he lays down flat in the bed and moves it around until he finds a position where it doesn't hurt.  He remarks that it feels like a toothache or a "muscle spasm," denying that it's a shooting pain when it acts up. "It just started as I started the chemo." He specifically notes that when he was doing physical therapy, it never bothered him; "not until after the chemo."  He's also noticed that when he blows his nose, especially on his left side, it looks like "blood clots." He says this occurs sometimes on both sides, but more on the L than the R. He confirms that he's had sinus infections before, and is still taking Allegra. He says that this bloody nose [place] been happening [place]  He says that he's still coughing, but notes that "sometimes stuff comes up and sometimes it doesn't."  He denies noticing any blood when he coughs.  In general he notes eating anything he wants, and that his appetite is good.  He is chewing ice  when he comes up to the exam table for the physical exam, noting "I like ice." When asked if he's eating it all the time, he says "not really."  He is active. He does yard work. Overall, he notes he is pleased with the progress he has made. Hair is thinning, he has done better with this than anticipated. No nausea or vomiting. No diarrhea or  constipation.   MEDICAL HISTORY:  Past Medical History  Diagnosis Date  . 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 COPD (chronic obstructive pulmonary disease) ([place])   . AAA (abdominal aortic aneurysm) (HCC)     4.5 cm December 2016  . Dyslipidemia   . Lung cancer ([place])     Stage III squamous cell, right lung  . Cardiomyopathy ([place])     LVEF 35% February 2017 [facility]  . PAF (paroxysmal atrial fibrillation) (HCC)     Versus SVT documented February 2017 [facility]    SURGICAL HISTORY: Past Surgical History  Procedure Laterality Date  . Finger surgery Right 1979  . Colonoscopy  08/31/2012    Procedure: COLONOSCOPY;  Surgeon: Jamesetta So, MD;  Location: [facility] ENDO SUITE;  Service: Gastroenterology;  Laterality: N/A;  . Bronchoscopy    . Portacath placement Left 02/27/2016    Procedure: INSERTION PORT-A-CATH LEFT SUBCLAVIAN;  Surgeon: Aviva Signs, MD;  Location: [facility] ORS;  Service: General;  Laterality: Left;    SOCIAL HISTORY: Social History   Social History  . Marital Status: Married    Spouse Name: N/A  . Number of Children: N/A  . Years of Education: N/A   Occupational History  . Not on file.   Social History Main Topics  . Smoking status: Former Smoker    Types: Cigarettes    Quit date: 12/31/2015  . Smokeless tobacco: Not on file  . Alcohol Use: 0.0 oz/week    0 Standard drinks or equivalent per week  . Drug Use: No  . Sexual Activity: Not on file   Other Topics Concern  . Not on file   Social History Narrative  Currently going through a divorce 0 children Ex smoker. Previously worked as a Dealer.  FAMILY HISTORY: Family History  Problem Relation Age of Onset  . Diabetes Mellitus II Mother   . Cancer Mother   . Cancer Father   . Coronary artery disease Brother     CABG   has no family status information on file.   Father died of a hematoma of the liver at 77 yo Mother died of heart  failure at 32 yo  ALLERGIES:  has No Known Allergies.  MEDICATIONS:  Current Outpatient Prescriptions  Medication Sig Dispense Refill  . albuterol (PROVENTIL HFA;VENTOLIN HFA) 108 (90 Base) MCG/ACT inhaler Inhale 2 puffs into the lungs every 4 (four) hours as needed for wheezing or shortness of breath.    Marland Kitchen aspirin 325 MG tablet Take 1 tablet (325 mg total) by mouth daily. 90 tablet 2  . atorvastatin (LIPITOR) 80 MG tablet Take 80 mg by mouth every evening.    . budesonide-formoterol (SYMBICORT) 160-4.5 MCG/ACT inhaler Inhale 2 puffs into the lungs 2 (two) times daily.    Marland Kitchen CARBOPLATIN IV Inject into the vein. To start 3/24    . carvedilol (COREG) 3.125 MG tablet Take 1 tablet (3.125 mg total) by mouth 2 (two) times daily. 180 tablet 3  . escitalopram (LEXAPRO) 10 MG tablet  Take 1 tablet (10 mg total) by mouth daily. 90 tablet 1  . fexofenadine (ALLEGRA ALLERGY) 180 MG tablet Take 1 tablet (180 mg total) by mouth daily. 30 tablet 3  . lidocaine-prilocaine (EMLA) cream Apply a quarter size amount to port site 1 hour prior to chemo. Do not rub in. Cover with plastic wrap. 30 g 3  . lisinopril (PRINIVIL,ZESTRIL) 2.5 MG tablet Take 1 tablet (2.5 mg total) by mouth daily. 90 tablet 3  . LORazepam (ATIVAN) 1 MG tablet Take 1 tablet (1 mg total) by mouth 3 (three) times daily. 60 tablet 0  . magic mouthwash w/lidocaine SOLN Take 30 mLs by mouth.    Marland Kitchen PACLitaxel Protein-Bound Part (ABRAXANE IV) Inject into the vein. To start 3/24.    Marland Kitchen pantoprazole (PROTONIX) 40 MG tablet Take 1 tablet (40 mg total) by mouth daily. 90 tablet 1  . pyridOXINE (VITAMIN B-6) 50 MG tablet Take 50 mg by mouth daily.    . ranitidine (ZANTAC) 150 MG tablet Take 1 tablet (150 mg total) by mouth 2 (two) times daily. 180 tablet 1  . rivaroxaban (XARELTO) 20 MG TABS tablet Take 1 tablet (20 mg total) by mouth daily. 90 tablet 2  . sodium chloride (OCEAN) 0.65 % nasal spray Place into the nose.    Marland Kitchen Spacer/Aero-Holding Chambers  (AEROCHAMBER PLUS FLO-VU) MISC     . SPIRIVA RESPIMAT 2.5 MCG/ACT AERS INHALE 2 PUFFS INTO LUNGS DAILY 1 Inhaler 3   No current facility-administered medications for this visit.    Review of Systems  Constitutional: Negative for weight loss.  Doing much better overall. HENT: Positive for congestion.   Eyes: Negative.   Respiratory: Positive for cough, sputum production, improved   Cardiovascular: Negative.   Gastrointestinal: Negative.  Negative for diarrhea.  Genitourinary: Negative.   Musculoskeletal: Negative for falls. Skin: Negative.   Neurological: Left leg weakness, chronic since prior CVA. LL pain Endo/Heme/Allergies: Negative.   Psychiatric/Behavioral: Currently denies All other systems reviewed and are negative. 14 point ROS was done and is otherwise as detailed above or in HPI  PHYSICAL EXAMINATION: ECOG PERFORMANCE STATUS: 1 - Symptomatic but completely ambulatory  Filed Vitals:   05/16/16 1000  BP: 119/68  Pulse: 105  Temp: 98.8 F (37.1 C)  Resp: 20   Filed Weights   05/16/16 1000  Weight: 145 lb (65.772 kg)    Physical Exam  Constitutional: He is oriented to person, place, and time and well-developed, well-nourished, and in no distress. Well groomed Ambulated to the clinic today, without a cane. Thinning hair HENT:  Head: Normocephalic and atraumatic.  Nose: Nose normal.  Mouth/Throat: Oropharynx is clear and moist. No oropharyngeal exudate.  Eyes: Conjunctivae and EOM are normal. Pupils are equal, round, and reactive to light. Right eye exhibits no discharge. Left eye exhibits no discharge. No scleral icterus.  Neck: Normal range of motion. Neck supple. No tracheal deviation present. No thyromegaly present.  Cardiovascular: Normal rate, regular rhythm and normal heart sounds.  Exam reveals no gallop and no friction rub.   No murmur heard. Pulmonary/Chest: Effort normal. He has no wheezes. He has no rales.  Abdominal: Soft. Bowel sounds are normal. He  exhibits no distension and no mass. There is no tenderness. There is no rebound and no guarding.  Musculoskeletal: Normal range of motion. He exhibits no edema.  Lymphadenopathy:    He has no cervical adenopathy.  Neurological: He is alert and oriented to person, place, and time. No cranial nerve deficit.  Skin: Skin is warm and dry. No rash noted.  Psychiatric: Mood, memory, affect and judgment normal.  Nursing note and vitals reviewed.  LABORATORY DATA:  I have reviewed the data as listed  CBC    Component Value Date/Time   WBC 2.2* 05/16/2016 1016   RBC 2.54* 05/16/2016 1016   HGB 8.6* 05/16/2016 1016   HCT 25.6* 05/16/2016 1016   PLT 203 05/16/2016 1016   MCV 100.8* 05/16/2016 1016   MCH 33.9 05/16/2016 1016   MCHC 33.6 05/16/2016 1016   RDW 18.6* 05/16/2016 1016   LYMPHSABS 0.3* 05/16/2016 1016   MONOABS 0.4 05/16/2016 1016   EOSABS 0.0 05/16/2016 1016   BASOSABS 0.0 05/16/2016 1016   CMP     Component Value Date/Time   NA 133* 05/09/2016 1041   K 3.7 05/09/2016 1041   CL 98* 05/09/2016 1041   CO2 26 05/09/2016 1041   GLUCOSE 114* 05/09/2016 1041   BUN 14 05/09/2016 1041   CREATININE 0.60* 05/09/2016 1041   CALCIUM 9.0 05/09/2016 1041   PROT 7.1 05/09/2016 1041   ALBUMIN 3.5 05/09/2016 1041   AST 24 05/09/2016 1041   ALT 22 05/09/2016 1041   ALKPHOS 66 05/09/2016 1041   BILITOT 0.6 05/09/2016 1041   GFRNONAA >60 05/09/2016 1041   GFRAA >60 05/09/2016 1041      RADIOGRAPHIC STUDIES: I have personally reviewed the radiological images as listed and agreed with the findings in the report.  Study Result     CLINICAL DATA: Right lung squamous cell carcinoma presenting for restaging status post chemotherapy and radiation therapy. Shortness of breath. Patient received emergent radiation therapy for airway compromise due to the subcarinal mass at the time of diagnosis on 01/11/2016.  EXAM: CT CHEST WITH CONTRAST  TECHNIQUE: Multidetector CT imaging  of the chest was performed during intravenous contrast administration.  CONTRAST: 89m ISOVUE-300 IOPAMIDOL (ISOVUE-300) INJECTION 61%  COMPARISON: 03/12/2016 chest CT .  FINDINGS: Mediastinum/Nodes: Normal heart size. Trace pericardial fluid/thickening is stable. Left subclavian Port-A-Cath terminates in the lower third of the superior vena cava. Atherosclerotic nonaneurysmal thoracic aorta. Normal caliber pulmonary arteries. No central pulmonary emboli. Normal visualized thyroid. Nonspecific mild circumferential wall thickening in the mid thoracic esophagus, not definitely changed. No axillary adenopathy. The infiltrative subcarinal mass measures 2.7 x 1.8 cm (series 2/ image 73), decreased from 3.1 x 2.5 cm. No new pathologically enlarged mediastinal or hilar nodes.  Lungs/Pleura: No pneumothorax. No pleural effusion. Mild centrilobular emphysema and diffuse bronchial wall thickening. There is nodular interlobular septal thickening and patchy nodular interstitial thickening of the peribronchovascular and centrilobular interstitium predominantly in the lower lobes, lingula and basilar upper lobes, significantly decreased from 03/12/2016. Areas of more confluent consolidation in the medial lower lobes have also decreased. No new consolidative airspace disease.  Upper abdomen: Two subcentimeter hypodense lesions in the visualized liver, too small to characterize, unchanged.  Musculoskeletal: No aggressive appearing focal osseous lesions.  IMPRESSION: 1. Continued decreased size of the infiltrative subcarinal mass. 2. Significant decrease in patchy basilar-predominant nodular interlobular and peribronchovascular interstitial thickening, favoring decreasing lymphangitic tumor. 3. No new or progressive metastatic disease in the chest. 4. Stable mild circumferential wall thickening in the mid thoracic esophagus, probably treatment related. 5. Mild centrilobular  emphysema and diffuse bronchial wall thickening, suggesting COPD.   Electronically Signed  By: JIlona SorrelM.D.  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 He is currently doing well with sequential chemotherapy, here today for cycle #3D8. Anemia is neoplasia and treatment related.   Imaging is improving. Unfortunately difficult to truly assess if some of the CT findings were disease vs. XRT changes/inflammation or infectious. Once he has completed 4 cycles imaging will be followed according to symptoms and NCCN guidelines.  Treatment is currently with curative intent, although sequential given that his XRT was given urgently at [facility]. We will add neulasta today given ANC of 1400 to avoid delay of cycle #4.   I have encouraged him to use saline rinses for his sinuses tid.  Given his L hip and LBP will check a bone scan.   Anemia is multifactorial including chemotherapy related.  If not done recently will check iron studies, B12, folate.  All questions were answered. The patient knows to call the clinic with any problems, questions or concerns.  This document serves as a record of services personally performed by Ancil Linsey, MD. It was created on her behalf by Toni Amend, a trained medical scribe. The creation of this record is based on the scribe's  personal observations and the provider's statements to them. This document has been checked and approved by the attending provider.  I have reviewed the above documentation for accuracy and completeness, and I agree with the above.  This note was electronically signed.  Molli Hazard, MD   05/16/2016 10:33 AM

## 2016-05-16 NOTE — Patient Instructions (Signed)
[place] at [facility] Discharge Instructions  RECOMMENDATIONS MADE BY THE CONSULTANT AND ANY TEST RESULTS WILL BE SENT TO YOUR REFERRING PHYSICIAN.   Bone Scan (orders in)  Return to clinic in 2 weeks to see Dr. Mamie Nick, and cycle 4 chemo    Thank you for choosing [place] at [facility] to provide your oncology and hematology care.  To afford each patient quality time with our provider, please arrive at least 15 minutes before your scheduled appointment time.   Beginning January 23rd 2017 lab work for the [facility] will be done in the  Main lab at [facility] on 1st floor. If you have a lab appointment with the [place] please come in thru the  Main Entrance and check in at the main information desk  You need to re-schedule your appointment should you arrive 10 or more minutes late.  We strive to give you quality time with our providers, and arriving late affects you and other patients whose appointments are after yours.  Also, if you no show three or more times for appointments you may be dismissed from the clinic at the providers discretion.     Again, thank you for choosing [facility].  Our hope is that these requests will decrease the amount of time that you wait before being seen by our physicians.       _____________________________________________________________  Should you have questions after your visit to [facility], please contact our office at [phone] between the hours of 8:30 a.m. and 4:30 p.m.  Voicemails left after 4:30 p.m. will not be returned until the following business day.  For prescription refill requests, have your pharmacy contact our office.         Resources For Cancer Patients and their Caregivers ? American Cancer Society: Can assist with transportation, wigs, general needs, runs Look Good Feel Better.        980-637-9765 ? Cancer Care: Provides financial  assistance, online support groups, medication/co-pay assistance.  1-800-813-HOPE 954-612-8507) ? [place] Assists [place] cancer patients and their families through emotional , educational and financial support.  (952) 294-2125 ? [place] DSS Where to apply for food stamps, Medicaid and utility assistance. (934)289-2091 ? RCATS: Transportation to medical appointments. 506-221-3256 ? Social Security Administration: May apply for disability if have a Stage IV cancer. 234-643-7032 (213)[phone] ? [employer], Disability and Transit Services: Assists with nutrition, care and transit needs. [place] Support Programs: '@10RELATIVEDAYS'$ @ > Cancer Support Group  2nd Tuesday of the month 1pm-2pm, Journey Room  > Creative Journey  3rd Tuesday of the month 1130am-1pm, Journey Room  > Look Good Feel Better  1st Wednesday of the month 10am-12 noon, Journey Room (Call [place] to register [phone])

## 2016-05-16 NOTE — Progress Notes (Signed)
[place] 1400; [name] aware; okay to tx today per MD; will add Neulasta OnPro today (changed to Neulasta injection d/t awaiting authorization; scheduled 05/19/16).  Tolerated tx w/o adverse reaction.  A&Ox4, in no distress.  VSS. Discharged ambulatory in c/o sister for transport home.

## 2016-05-19 ENCOUNTER — Encounter (HOSPITAL_COMMUNITY): Payer: Self-pay

## 2016-05-19 ENCOUNTER — Encounter (HOSPITAL_BASED_OUTPATIENT_CLINIC_OR_DEPARTMENT_OTHER): Payer: Medicare PPO

## 2016-05-19 DIAGNOSIS — Z5189 Encounter for other specified aftercare: Secondary | ICD-10-CM

## 2016-05-19 DIAGNOSIS — D709 Neutropenia, unspecified: Secondary | ICD-10-CM | POA: Diagnosis not present

## 2016-05-19 MED ORDER — PEGFILGRASTIM INJECTION 6 MG/0.6ML ~~LOC~~
PREFILLED_SYRINGE | SUBCUTANEOUS | Status: AC
  Filled 2016-05-19: qty 0.6

## 2016-05-19 MED ORDER — PEGFILGRASTIM INJECTION 6 MG/0.6ML ~~LOC~~
6.0000 mg | PREFILLED_SYRINGE | Freq: Once | SUBCUTANEOUS | Status: AC
  Administered 2016-05-19: 6 mg via SUBCUTANEOUS

## 2016-05-19 NOTE — Patient Instructions (Signed)
[place] at [facility] Discharge Instructions  RECOMMENDATIONS MADE BY THE CONSULTANT AND ANY TEST RESULTS WILL BE SENT TO YOUR REFERRING PHYSICIAN.   neulasta today Follow up as scheduled Please call the clinic if you have any questions or concerns    Thank you for choosing [place] at [facility] to provide your oncology and hematology care.  To afford each patient quality time with our provider, please arrive at least 15 minutes before your scheduled appointment time.   Beginning January 23rd 2017 lab work for the [facility] will be done in the  Main lab at [facility] on 1st floor. If you have a lab appointment with the [place] please come in thru the  Main Entrance and check in at the main information desk  You need to re-schedule your appointment should you arrive 10 or more minutes late.  We strive to give you quality time with our providers, and arriving late affects you and other patients whose appointments are after yours.  Also, if you no show three or more times for appointments you may be dismissed from the clinic at the providers discretion.     Again, thank you for choosing [facility].  Our hope is that these requests will decrease the amount of time that you wait before being seen by our physicians.       _____________________________________________________________  Should you have questions after your visit to [facility], please contact our office at [phone] between the hours of 8:30 a.m. and 4:30 p.m.  Voicemails left after 4:30 p.m. will not be returned until the following business day.  For prescription refill requests, have your pharmacy contact our office.         Resources For Cancer Patients and their Caregivers ? American Cancer Society: Can assist with transportation, wigs, general needs, runs Look Good Feel Better.        [phone] ? Cancer Care: Provides  financial assistance, online support groups, medication/co-pay assistance.  1-800-813-HOPE (920) 823-0863) ? [place] Assists [place] cancer patients and their families through emotional , educational and financial support.  (312)887-4177 ? [place] DSS Where to apply for food stamps, Medicaid and utility assistance. 502-854-0416 ? RCATS: Transportation to medical appointments. 252-774-2744 ? Social Security Administration: May apply for disability if have a Stage IV cancer. 602-176-0077 506-541-7574 ? [employer], Disability and Transit Services: Assists with nutrition, care and transit needs. [place] Support Programs: '@10RELATIVEDAYS'$ @ > Cancer Support Group  2nd Tuesday of the month 1pm-2pm, Journey Room  > Creative Journey  3rd Tuesday of the month 1130am-1pm, Journey Room  > Look Good Feel Better  1st Wednesday of the month 10am-12 noon, Journey Room (Call [place] to register [phone])

## 2016-05-19 NOTE — Progress Notes (Signed)
Tyler Aguilar's reason for visit today is for an injection as scheduled per MD orders. Tyler Aguilar also received neulasta per MD orders; see [facility] for administration details.  Tyler Aguilar tolerated all procedures well and without incident; questions were answered and patient was discharged.

## 2016-05-28 ENCOUNTER — Encounter: Payer: Self-pay | Admitting: Cardiology

## 2016-05-28 ENCOUNTER — Ambulatory Visit (INDEPENDENT_AMBULATORY_CARE_PROVIDER_SITE_OTHER): Payer: Medicare PPO | Admitting: Cardiology

## 2016-05-28 VITALS — BP 92/60 | HR 99 | Ht 65.0 in | Wt 145.0 lb

## 2016-05-28 DIAGNOSIS — I480 Paroxysmal atrial fibrillation: Secondary | ICD-10-CM

## 2016-05-28 DIAGNOSIS — I5022 Chronic systolic (congestive) heart failure: Secondary | ICD-10-CM

## 2016-05-28 DIAGNOSIS — E785 Hyperlipidemia, unspecified: Secondary | ICD-10-CM | POA: Diagnosis not present

## 2016-05-28 NOTE — Patient Instructions (Signed)
Medication Instructions:  STOP LISINOPRIL   Labwork: NONE  Testing/Procedures: NONE  Follow-Up: Your physician recommends that you schedule a follow-up appointment in: 2 MONTHS    Any Other Special Instructions Will Be Listed Below (If Applicable).  I HAVE GIVEN YOU SOME SAMPLES OF XARELTO    If you need a refill on your cardiac medications before your next appointment, please call your pharmacy.

## 2016-05-28 NOTE — Progress Notes (Signed)
Patient ID: Tyler Aguilar, male   DOB: 1956-08-17, 60 y.o.   MRN: [id number]     Clinical Summary Tyler Aguilar is a 60 y.o.male seen today for follow up of the following medical problems.   1. Chronic systolic heart failure - echo Jan 2017 LVEF 50-55% - during admit at [facility] 01/2016 LVEF dropped to 35%. This was in the setting of rapid afib and acute respiratory illness and and respiratory failure with intubation. From notes acute heart failure at that time as well, diuresed 10 liters.  - repeat echo 02/2016 showed LVEF 35-40%, multiple WMAs.   - last visit we started lisionpril 2.'5mg'$  daily. Since that time has had a cough.  - stable SOB/DOE. Weights stable 145 lbs. No recent LE edema.    2. Afib - episode during admission at [facility] in 01/2016 - on xarelto,can have occasoinal nose bleeds but overall mild - denies any palpitations.  - xarelto is too expensive. $500 for 90 days. Has [employer].    3. Lung CA - history of radiation and chemo.   4. CVA - on ASA for secondary prevention, followed by neurology  5. AAA   Past Medical History  Diagnosis Date  . 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 COPD (chronic obstructive pulmonary disease) ([place])   . AAA (abdominal aortic aneurysm) (HCC)     4.5 cm December 2016  . Dyslipidemia   . Lung cancer ([place])     Stage III squamous cell, right lung  . Cardiomyopathy ([place])     LVEF 35% February 2017 [facility]  . PAF (paroxysmal atrial fibrillation) (HCC)     Versus SVT documented February 2017 [facility]     No Known Allergies   Current Outpatient Prescriptions  Medication Sig Dispense Refill  . albuterol (PROVENTIL HFA;VENTOLIN HFA) 108 (90 Base) MCG/ACT inhaler Inhale 2 puffs into the lungs every 4 (four) hours as needed for wheezing or shortness of breath.    Marland Kitchen aspirin 325 MG tablet Take 1 tablet (325 mg total) by mouth daily. 90 tablet 2  .  atorvastatin (LIPITOR) 80 MG tablet Take 80 mg by mouth every evening.    . budesonide-formoterol (SYMBICORT) 160-4.5 MCG/ACT inhaler Inhale 2 puffs into the lungs 2 (two) times daily.    Marland Kitchen CARBOPLATIN IV Inject into the vein. To start 3/24    . carvedilol (COREG) 3.125 MG tablet Take 1 tablet (3.125 mg total) by mouth 2 (two) times daily. 180 tablet 3  . escitalopram (LEXAPRO) 10 MG tablet Take 1 tablet (10 mg total) by mouth daily. 90 tablet 1  . fexofenadine (ALLEGRA ALLERGY) 180 MG tablet Take 1 tablet (180 mg total) by mouth daily. 30 tablet 3  . lidocaine-prilocaine (EMLA) cream Apply a quarter size amount to port site 1 hour prior to chemo. Do not rub in. Cover with plastic wrap. 30 g 3  . lisinopril (PRINIVIL,ZESTRIL) 2.5 MG tablet Take 1 tablet (2.5 mg total) by mouth daily. 90 tablet 3  . LORazepam (ATIVAN) 1 MG tablet Take 1 tablet (1 mg total) by mouth 3 (three) times daily. 60 tablet 0  . magic mouthwash w/lidocaine SOLN Take 30 mLs by mouth.    Marland Kitchen PACLitaxel Protein-Bound Part (ABRAXANE IV) Inject into the vein. To start 3/24.    Marland Kitchen pantoprazole (PROTONIX) 40 MG tablet Take 1 tablet (40 mg total) by mouth daily. 90 tablet 1  .  pyridOXINE (VITAMIN B-6) 50 MG tablet Take 50 mg by mouth daily.    . ranitidine (ZANTAC) 150 MG tablet Take 1 tablet (150 mg total) by mouth 2 (two) times daily. 180 tablet 1  . rivaroxaban (XARELTO) 20 MG TABS tablet Take 1 tablet (20 mg total) by mouth daily. 90 tablet 2  . sodium chloride (OCEAN) 0.65 % nasal spray Place into the nose.    Marland Kitchen Spacer/Aero-Holding Chambers (AEROCHAMBER PLUS FLO-VU) MISC     . SPIRIVA RESPIMAT 2.5 MCG/ACT AERS INHALE 2 PUFFS INTO LUNGS DAILY 1 Inhaler 3   No current facility-administered medications for this visit.     Past Surgical History  Procedure Laterality Date  . Finger surgery Right 1979  . Colonoscopy  08/31/2012    Procedure: COLONOSCOPY;  Surgeon: Jamesetta So, MD;  Location: [facility] ENDO SUITE;  Service:  Gastroenterology;  Laterality: N/A;  . Bronchoscopy    . Portacath placement Left 02/27/2016    Procedure: INSERTION PORT-A-CATH LEFT SUBCLAVIAN;  Surgeon: Aviva Signs, MD;  Location: [facility] ORS;  Service: General;  Laterality: Left;     No Known Allergies    Family History  Problem Relation Age of Onset  . Diabetes Mellitus II Mother   . Cancer Mother   . Cancer Father   . Coronary artery disease Brother     CABG     Social History Tyler Aguilar reports that he quit smoking about 4 months ago. His smoking use included Cigarettes. He does not have any smokeless tobacco history on file. Tyler Aguilar reports that he drinks alcohol.   Review of Systems CONSTITUTIONAL: No weight loss, fever, chills, weakness or fatigue.  HEENT: Eyes: No visual loss, blurred vision, double vision or yellow sclerae.No hearing loss, sneezing, congestion, runny nose or sore throat.  SKIN: No rash or itching.  CARDIOVASCULAR: per HPI RESPIRATORY: No shortness of breath, cough or sputum.  GASTROINTESTINAL: No anorexia, nausea, vomiting or diarrhea. No abdominal pain or blood.  GENITOURINARY: No burning on urination, no polyuria NEUROLOGICAL: No headache, dizziness, syncope, paralysis, ataxia, numbness or tingling in the extremities. No change in bowel or bladder control.  MUSCULOSKELETAL: No muscle, back pain, joint pain or stiffness.  LYMPHATICS: No enlarged nodes. No history of splenectomy.  PSYCHIATRIC: No history of depression or anxiety.  ENDOCRINOLOGIC: No reports of sweating, cold or heat intolerance. No polyuria or polydipsia.  Marland Kitchen   Physical Examination Filed Vitals:   05/28/16 0908  BP: 92/60  Pulse: 99   Filed Vitals:   05/28/16 0908  Height: '5\' 5"'$  (1.651 m)  Weight: 145 lb (65.772 kg)    Gen: resting comfortably, no acute distress HEENT: no scleral icterus, pupils equal round and reactive, no palptable cervical adenopathy,  CV: RRR, no m/r/g, no jvd Resp: Clear to auscultation  bilaterally GI: abdomen is soft, non-tender, non-distended, normal bowel sounds, no hepatosplenomegaly MSK: extremities are warm, no edema.  Skin: warm, no rash Neuro:  no focal deficits Psych: appropriate affect   Diagnostic Studies  Echocardiogram 01/22/2016 [facility]): SUMMARY Technically difficult images d/t Atrial fibrillation and patient sitting  straight up in bed with difficulty breathing. The left ventricle is borderline dilated. There is moderate concentric left ventricular hypertrophy. Overall left ventricular function appears to be moderately reduced . LV ejection fraction = 35%. There is moderate global hypokinesis of the left ventricle. The right ventricle is normal size. The right ventricular systolic function is mildly reduced. There is dysynchrony of the LV and RV. The left atrial size is  normal.  Right atrium not well visualized. There is aortic valve sclerosis. There is mild aortic regurgitation. There is no pericardial effusion. Compared to exam images of 05/03/08, LV size has increased and function has  decreased.  Echocardiogram 01/03/2016: Study Conclusions  - Left ventricle: The cavity size was normal. Wall thickness was  increased in a pattern of mild LVH. Systolic function was normal.  The estimated ejection fraction was in the range of 50% to 55%.  Doppler parameters are consistent with abnormal left ventricular  relaxation (grade 1 diastolic dysfunction). Doppler parameters  are consistent with high ventricular filling pressure. - Aortic valve: Mildly calcified annulus. Trileaflet; mildly  thickened leaflets. There was mild regurgitation. Valve area  (VTI): 2.31 cm^2. Valve area (Vmax): 2.16 cm^2. - Aorta: The visualized portion of the proximal ascending aorta is  mildly dilated at 40 mm. Aortic root dimension: 42 mm (ED). - Aortic root: The aortic root was mildly dilated. - Atrial septum: No defect or patent foramen ovale was  identified. - Technically difficult study.  02/2016 Echo Study Conclusions  - Left ventricle: LVEF is approximately 35 to 40% with  hypokinesis/akineis of the lateral wall; akinesis of the  mid/distal posterior wall. The cavity size was normal. Wall  thickness was increased in a pattern of mild LVH. Doppler  parameters are consistent with abnormal left ventricular  relaxation (grade 1 diastolic dysfunction). - Mitral valve: Calcified annulus. Mildly thickened leaflets .   Assessment and Plan  1. Chronic systolic HF - LVEF 06% by echo 01/2016 at [facility] in setting of acute respiratory illness and afib - repeat echo LVEF 35-40% with multiple WMAs - cough on ACE-I, he will try holding and following symptoms. He is to contact us in 2 weeks, if cough resolved start low dose losartan  2. Afib - no current symptoms - continue rate control. CHADS2Vasc score of 3 (CVA, CHF), continue xarelto - occasoinal nose bleeds, from cardiac standpoint he does not need to be on both xarelto and ASA. Ok to stop ASA if ok with neuro, he has f/u with them next week.  - xarelto expensive, we will have eliquis priced for him. May need to change to coumaidn  3. Hyperlipidemia - continue high dose statin in setting of prior CVA  F/u 2 months

## 2016-05-29 ENCOUNTER — Encounter (HOSPITAL_COMMUNITY)
Admission: RE | Admit: 2016-05-29 | Discharge: 2016-05-29 | Disposition: A | Discharge status: Home / Self Care | Payer: Medicare PPO | Source: Ambulatory Visit | Attending: Hematology & Oncology | Admitting: Hematology & Oncology

## 2016-05-29 ENCOUNTER — Encounter (HOSPITAL_COMMUNITY): Payer: Self-pay

## 2016-05-29 DIAGNOSIS — M25552 Pain in left hip: Secondary | ICD-10-CM | POA: Insufficient documentation

## 2016-05-29 DIAGNOSIS — C3490 Malignant neoplasm of unspecified part of unspecified bronchus or lung: Secondary | ICD-10-CM | POA: Insufficient documentation

## 2016-05-29 DIAGNOSIS — R937 Abnormal findings on diagnostic imaging of other parts of musculoskeletal system: Secondary | ICD-10-CM | POA: Diagnosis not present

## 2016-05-29 DIAGNOSIS — M899 Disorder of bone, unspecified: Secondary | ICD-10-CM | POA: Insufficient documentation

## 2016-05-29 DIAGNOSIS — M545 Low back pain: Secondary | ICD-10-CM | POA: Insufficient documentation

## 2016-05-29 MED ORDER — TECHNETIUM TC 99M MEDRONATE IV KIT
25.0000 | PACK | Freq: Once | INTRAVENOUS | Status: AC | PRN
  Administered 2016-05-29: 25 via INTRAVENOUS

## 2016-05-30 ENCOUNTER — Encounter (HOSPITAL_BASED_OUTPATIENT_CLINIC_OR_DEPARTMENT_OTHER): Payer: Medicare PPO

## 2016-05-30 ENCOUNTER — Ambulatory Visit (HOSPITAL_COMMUNITY)
Admission: RE | Admit: 2016-05-30 | Discharge: 2016-05-30 | Disposition: A | Discharge status: Home / Self Care | Payer: Medicare PPO | Source: Ambulatory Visit | Attending: Hematology & Oncology | Admitting: Hematology & Oncology

## 2016-05-30 ENCOUNTER — Encounter: Payer: Self-pay | Admitting: Hematology & Oncology

## 2016-05-30 ENCOUNTER — Encounter (HOSPITAL_COMMUNITY): Payer: Self-pay

## 2016-05-30 ENCOUNTER — Encounter (HOSPITAL_BASED_OUTPATIENT_CLINIC_OR_DEPARTMENT_OTHER): Payer: Medicare PPO | Admitting: Oncology

## 2016-05-30 ENCOUNTER — Ambulatory Visit (HOSPITAL_COMMUNITY)
Admission: RE | Admit: 2016-05-30 | Discharge: 2016-05-30 | Disposition: A | Discharge status: Home / Self Care | Payer: Medicare PPO | Source: Ambulatory Visit | Attending: Oncology | Admitting: Oncology

## 2016-05-30 VITALS — BP 110/68 | HR 86 | Temp 98.0°F | Resp 18 | Wt 145.0 lb

## 2016-05-30 VITALS — Resp 18

## 2016-05-30 DIAGNOSIS — C3490 Malignant neoplasm of unspecified part of unspecified bronchus or lung: Secondary | ICD-10-CM | POA: Diagnosis not present

## 2016-05-30 DIAGNOSIS — M791 Myalgia: Secondary | ICD-10-CM

## 2016-05-30 DIAGNOSIS — R05 Cough: Secondary | ICD-10-CM

## 2016-05-30 DIAGNOSIS — M545 Low back pain, unspecified: Secondary | ICD-10-CM

## 2016-05-30 DIAGNOSIS — C3491 Malignant neoplasm of unspecified part of right bronchus or lung: Secondary | ICD-10-CM

## 2016-05-30 DIAGNOSIS — D6481 Anemia due to antineoplastic chemotherapy: Secondary | ICD-10-CM | POA: Diagnosis not present

## 2016-05-30 DIAGNOSIS — Z5111 Encounter for antineoplastic chemotherapy: Secondary | ICD-10-CM | POA: Diagnosis not present

## 2016-05-30 DIAGNOSIS — D630 Anemia in neoplastic disease: Secondary | ICD-10-CM

## 2016-05-30 DIAGNOSIS — M25552 Pain in left hip: Secondary | ICD-10-CM

## 2016-05-30 LAB — COMPREHENSIVE METABOLIC PANEL
ALK PHOS: 73 U/L (ref 38–126)
ALT: 19 U/L (ref 17–63)
AST: 21 U/L (ref 15–41)
Albumin: 3.2 g/dL — ABNORMAL LOW (ref 3.5–5.0)
Anion gap: 6 (ref 5–15)
BILIRUBIN TOTAL: 0.7 mg/dL (ref 0.3–1.2)
BUN: 10 mg/dL (ref 6–20)
CALCIUM: 8.6 mg/dL — AB (ref 8.9–10.3)
CHLORIDE: 100 mmol/L — AB (ref 101–111)
CO2: 25 mmol/L (ref 22–32)
CREATININE: 0.62 mg/dL (ref 0.61–1.24)
Glucose, Bld: 104 mg/dL — ABNORMAL HIGH (ref 65–99)
Potassium: 3.8 mmol/L (ref 3.5–5.1)
Sodium: 131 mmol/L — ABNORMAL LOW (ref 135–145)
TOTAL PROTEIN: 6.6 g/dL (ref 6.5–8.1)

## 2016-05-30 LAB — CBC WITH DIFFERENTIAL/PLATELET
BASOS ABS: 0 10*3/uL (ref 0.0–0.1)
Basophils Relative: 0 %
Eosinophils Absolute: 0 10*3/uL (ref 0.0–0.7)
Eosinophils Relative: 0 %
HEMATOCRIT: 25.3 % — AB (ref 39.0–52.0)
HEMOGLOBIN: 8.5 g/dL — AB (ref 13.0–17.0)
LYMPHS ABS: 0.4 10*3/uL — AB (ref 0.7–4.0)
LYMPHS PCT: 5 %
MCH: 34.8 pg — AB (ref 26.0–34.0)
MCHC: 33.6 g/dL (ref 30.0–36.0)
MCV: 103.7 fL — AB (ref 78.0–100.0)
Monocytes Absolute: 0.6 10*3/uL (ref 0.1–1.0)
Monocytes Relative: 8 %
NEUTROS ABS: 7.5 10*3/uL (ref 1.7–7.7)
Neutrophils Relative %: 88 %
Platelets: 143 10*3/uL — ABNORMAL LOW (ref 150–400)
RBC: 2.44 MIL/uL — AB (ref 4.22–5.81)
RDW: 20.7 % — ABNORMAL HIGH (ref 11.5–15.5)
WBC: 8.6 10*3/uL (ref 4.0–10.5)

## 2016-05-30 MED ORDER — SODIUM CHLORIDE 0.9 % IV SOLN
10.0000 mg | Freq: Once | INTRAVENOUS | Status: AC
  Administered 2016-05-30: 10 mg via INTRAVENOUS
  Filled 2016-05-30: qty 1

## 2016-05-30 MED ORDER — SODIUM CHLORIDE 0.9 % IV SOLN
Freq: Once | INTRAVENOUS | Status: AC
  Administered 2016-05-30: 11:00:00 via INTRAVENOUS

## 2016-05-30 MED ORDER — SODIUM CHLORIDE 0.9% FLUSH
10.0000 mL | INTRAVENOUS | Status: DC | PRN
  Administered 2016-05-30: 10 mL
  Filled 2016-05-30: qty 10

## 2016-05-30 MED ORDER — LORAZEPAM 2 MG/ML IJ SOLN
0.5000 mg | Freq: Once | INTRAMUSCULAR | Status: AC
  Administered 2016-05-30: 0.5 mg via INTRAVENOUS
  Filled 2016-05-30: qty 1

## 2016-05-30 MED ORDER — HEPARIN SOD (PORK) LOCK FLUSH 100 UNIT/ML IV SOLN
500.0000 [IU] | Freq: Once | INTRAVENOUS | Status: AC | PRN
  Administered 2016-05-30: 500 [IU]
  Filled 2016-05-30: qty 5

## 2016-05-30 MED ORDER — PACLITAXEL PROTEIN-BOUND CHEMO INJECTION 100 MG
100.0000 mg/m2 | Freq: Once | INTRAVENOUS | Status: AC
  Administered 2016-05-30: 175 mg via INTRAVENOUS
  Filled 2016-05-30: qty 35

## 2016-05-30 MED ORDER — PALONOSETRON HCL INJECTION 0.25 MG/5ML
0.2500 mg | Freq: Once | INTRAVENOUS | Status: AC
  Administered 2016-05-30: 0.25 mg via INTRAVENOUS
  Filled 2016-05-30: qty 5

## 2016-05-30 MED ORDER — SODIUM CHLORIDE 0.9 % IV SOLN
660.0000 mg | Freq: Once | INTRAVENOUS | Status: AC
  Administered 2016-05-30: 660 mg via INTRAVENOUS
  Filled 2016-05-30: qty 66

## 2016-05-30 NOTE — Patient Instructions (Signed)
[facility] Discharge Instructions for Patients Receiving Chemotherapy   Beginning January 23rd 2017 lab work for the [facility] will be done in the  Main lab at [facility] on 1st floor. If you have a lab appointment with the [place] please come in thru the  Main Entrance and check in at the main information desk   Today you received the following chemotherapy agents:  Abraxane and Carboplatin  If you develop nausea and vomiting, or diarrhea that is not controlled by your medication, call the clinic.  The clinic phone number is [phone]. Office hours are Monday-Friday 8:30am-5:00pm.  BELOW ARE SYMPTOMS THAT SHOULD BE REPORTED IMMEDIATELY:  *FEVER GREATER THAN 101.0 F  *CHILLS WITH OR WITHOUT FEVER  NAUSEA AND VOMITING THAT IS NOT CONTROLLED WITH YOUR NAUSEA MEDICATION  *UNUSUAL SHORTNESS OF BREATH  *UNUSUAL BRUISING OR BLEEDING  TENDERNESS IN MOUTH AND THROAT WITH OR WITHOUT PRESENCE OF ULCERS  *URINARY PROBLEMS  *BOWEL PROBLEMS  UNUSUAL RASH Items with * indicate a potential emergency and should be followed up as soon as possible. If you have an emergency after office hours please contact your primary care physician or go to the nearest emergency department.  Please call the clinic during office hours if you have any questions or concerns.   You may also contact the Patient Navigator at [phone] should you have any questions or need assistance in obtaining follow up care.      Resources For Cancer Patients and their Caregivers ? American Cancer Society: Can assist with transportation, wigs, general needs, runs Look Good Feel Better.        [phone] ? Cancer Care: Provides financial assistance, online support groups, medication/co-pay assistance.  1-800-813-HOPE 323-142-5556) ? [place] Assists [place] cancer patients and their families through emotional , educational and financial support.   [phone] ? [place] DSS Where to apply for food stamps, Medicaid and utility assistance. [phone] ? RC[employer]: Transportation to medical appointments. [phone] ? Social Security Administration: May apply for disability if have a Stage IV cancer. [phone] ? [employer], Disability and Transit Services: Assists with nutrition, care and transit needs. [phone]

## 2016-05-30 NOTE — Progress Notes (Signed)
Tyler Bogus, MD [place] [id number]  Squamous cell lung cancer, unspecified laterality ([place]) - Plan: DG Pelvis 1-2 Views  CURRENT THERAPY: Carboplatin/Abraxane day 1, 8, 15 every 28 day fashion beginning on 03/07/2016.  INTERVAL HISTORY: Tyler Aguilar 60 y.o. 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 Now on systemic chemotherapy consisting of Carboplatin/Abraxane beginning on 03/07/2016.     Squamous cell lung cancer ([place])   01/01/2016 Pathology Results EBUS FNA subcarinal mass, squamous cell carcinoma   01/11/2016 -  Radiation Therapy RT for palliation of central lung NSCLC at [facility] started on 01/11/2016   01/17/2016 Pathology Results PD-L1 shows high expression- proportion score of 50- 60%   03/07/2016 -  Chemotherapy Carboplatin/Abraxane day 1, 8, 15 every 28 day   03/12/2016 Imaging CT chest- Pulmonary parenchymal pattern of peribronchovascular ground-glass, nodularity and consolidation is likely new or progressive from chest radiograph 02/27/2016 and may be due to an infectious bronchiolitis/bronchopneumonia.    04/29/2016 Imaging CT chest- Continued decreased size of the infiltrative subcarinal mass. Significant decrease in patchy  basilar-predominant nodular interlobular and peribronchovascular interstitial thickening, favoring decreasing lymphangitic tumor.   05/29/2016 Imaging Bone scan- Focal photopenic abnormality in the left iliac wing. Suspect lytic bone lesion or less likely, artifact.   He is doing well. He continues to tolerate therapy without any major issues. He does have a few complaints which he voices today: 1. He notes a dry cough. I do not see that he is on any ACE inhibitor's. He denies  any sputum production. He is afebrile.  He denies any shaking chills.   I reviewed cardiology's note this states starting lisinopril 2.5 mg daily, but this is not on his medication list. I reviewed his medication history and this medication was discontinued in May 2017. 2. He notes a left buttocks and left posterior upper leg discomfort. He has tried oxycodone for this discomfort which is ineffective. "I think drinking ginger ale works better than the pain medicine." 3. He notes a significant increase in cost of his Xarelto. He was given samples of Xarelto by cardiology. He notes that a 90 day supply cause him $500. He is working with Dr. Harl Bowie to find a more affordable anticoagulant. 4. Epistaxis on nose blowing.  This is mild and secondary to anticoagulation and ASA.  "It didn't happen when I was on Plavix."   Review of Systems  Constitutional: Negative.  Negative for fever, chills and weight loss.  HENT: Negative.   Eyes: Negative.   Respiratory: Positive for cough (Dry cough). Negative for sputum production, shortness of breath and wheezing.   Cardiovascular: Negative.   Gastrointestinal: Negative.   Genitourinary: Negative.   Musculoskeletal: Negative.   Skin: Negative.   Neurological: Negative.   Endo/Heme/Allergies: Negative.   Psychiatric/Behavioral: Negative.     Past Medical History  Diagnosis Date  . 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 COPD (chronic obstructive pulmonary disease) ([place])   . AAA (abdominal aortic aneurysm) (HCC)     4.5 cm December 2016  . Dyslipidemia   . Lung cancer ([place])     Stage III squamous cell, right lung  . Cardiomyopathy (  [place])     LVEF 35% February 2017 [facility]  . PAF (paroxysmal atrial fibrillation) (HCC)     Versus SVT documented February 2017 [facility]    Past Surgical History  Procedure Laterality Date  . Finger surgery Right 1979  . Colonoscopy  08/31/2012     Procedure: COLONOSCOPY;  Surgeon: Jamesetta So, MD;  Location: [facility] ENDO SUITE;  Service: Gastroenterology;  Laterality: N/A;  . Bronchoscopy    . Portacath placement Left 02/27/2016    Procedure: INSERTION PORT-A-CATH LEFT SUBCLAVIAN;  Surgeon: Aviva Signs, MD;  Location: [facility] ORS;  Service: General;  Laterality: Left;    Family History  Problem Relation Age of Onset  . Diabetes Mellitus II Mother   . Cancer Mother   . Cancer Father   . Coronary artery disease Brother     CABG    Social History   Social History  . Marital Status: Married    Spouse Name: N/A  . Number of Children: N/A  . Years of Education: N/A   Social History Main Topics  . Smoking status: Former Smoker    Types: Cigarettes    Quit date: 12/31/2015  . Smokeless tobacco: Not on file  . Alcohol Use: 0.0 oz/week    0 Standard drinks or equivalent per week  . Drug Use: No  . Sexual Activity: Not on file   Other Topics Concern  . Not on file   Social History Narrative     PHYSICAL EXAMINATION  ECOG PERFORMANCE STATUS: 1 - Symptomatic but completely ambulatory  Filed Vitals:   05/30/16 0955  Resp: 18  Blood pressure 115/71 Pulse 90 Temperature 98.33F Oxygen saturation 98% on room air.  GENERAL:alert, well nourished, well developed, comfortable, cooperative, smiling and chronically ill appearing and unaccompanied SKIN: skin color, texture, turgor are normal, no rashes or significant lesions HEAD: Normocephalic, No masses, lesions, tenderness or abnormalities EYES: normal, EOMI, Conjunctiva are pink and non-injected EARS: External ears normal OROPHARYNX:lips, buccal mucosa, and tongue normal and mucous membranes are moist  NECK: supple, trachea midline LYMPH:  not examined BREAST:not examined LUNGS: clear to auscultation and percussion HEART: regular rate & rhythm ABDOMEN:abdomen soft and normal bowel sounds BACK: Back symmetric, no curvature. EXTREMITIES:less then 2 second capillary refill, no  joint deformities, effusion, or inflammation, no skin discoloration, no cyanosis  NEURO: alert & oriented x 3 with fluent speech, no focal motor/sensory deficits, gait normal   LABORATORY DATA: CBC    Component Value Date/Time   WBC 8.6 05/30/2016 1000   RBC 2.44* 05/30/2016 1000   HGB 8.5* 05/30/2016 1000   HCT 25.3* 05/30/2016 1000   PLT 143* 05/30/2016 1000   MCV 103.7* 05/30/2016 1000   MCH 34.8* 05/30/2016 1000   MCHC 33.6 05/30/2016 1000   RDW 20.7* 05/30/2016 1000   LYMPHSABS 0.4* 05/30/2016 1000   MONOABS 0.6 05/30/2016 1000   EOSABS 0.0 05/30/2016 1000   BASOSABS 0.0 05/30/2016 1000      Chemistry      Component Value Date/Time   NA 131* 05/30/2016 1000   K 3.8 05/30/2016 1000   CL 100* 05/30/2016 1000   CO2 25 05/30/2016 1000   BUN 10 05/30/2016 1000   CREATININE 0.62 05/30/2016 1000      Component Value Date/Time   CALCIUM 8.6* 05/30/2016 1000   ALKPHOS 73 05/30/2016 1000   AST 21 05/30/2016 1000   ALT 19 05/30/2016 1000   BILITOT 0.7 05/30/2016 1000  PENDING LABS:   RADIOGRAPHIC STUDIES:  Nm Bone Scan Whole Body  05/29/2016  CLINICAL DATA:  Tc MDP 22.9 mCi. Patient notes Left hip/leg pain since beginning chemo in the past months. Diagnosed with cancer in Jan 2017.^25MILLI CURIE TC-MDP TECHNETIUM TC 50M MEDRONATE IV KIT. Squamous cell carcinoma of the lung. EXAM: NUCLEAR MEDICINE WHOLE BODY BONE SCAN TECHNIQUE: Whole body anterior and posterior images were obtained approximately 3 hours after intravenous injection of radiopharmaceutical. RADIOPHARMACEUTICALS:  22.9 mCi Technetium-58mMDP IV COMPARISON:  Chest CT 04/29/2016 FINDINGS: Bilateral renal activity is present. Focal round photopenic defect is identified overlying the left iliac wing. Mildly asymmetric activity in the ankles is likely due to positioning. No other asymmetric areas of activity are identified to suggest osseous metastatic disease. IMPRESSION: 1. Focal photopenic abnormality in  the left iliac wing. 2. Suspect lytic bone lesion or less likely, artifact. Does the patient have an implanted metallic device? 3. Plain film of the pelvis is recommended for further evaluation. These results will be called to the ordering clinician or representative by the Radiologist Assistant, and communication documented in the PACS or zVision Dashboard. Electronically Signed   By: ENolon NationsM.D.   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  Now on systemic chemotherapy consisting of Carboplatin/Abraxane beginning on 03/07/2016.  PDL1- proportion score of 50- 60% (high expression)  Oncology history is updated.  Pre-treatment labs as ordered today.  Labs meet treatment parameters.  His anemia is noted and stable compared to 2 weeks ago.  He is resistant to PRBC transfusion and he is currently asymptomatic.  We will need to monitor moving forward.  He is not an ESA candidate due to "curative" intent of treatment.  Bone scan demonstrates an abnormality in the left iliac wing.  Recommended plain pelvic films and therefore imaging orders are placed for this.  This will be completed today following completion of chemotherapy. DG pelvis 1-2 views.  He is tolerating treatment well.  His weight is stable.  He denies any nausea/vomiting. His appetite is strong.  [facility] onc[facility] history as noted above. We will perform a Gardiant360 to evaluate ALK/EGFR status.  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  Given curative intent treatment, he is NOT a candidate for ESA therapy.  He notes a dry cough  that is not productive.  He is afebrile.  No signs or symptoms of infection.  He declines a cough suppressant.    He notes some left buttock and posterior aspect of upper leg discomfort that has been ongoing for some time.  He is welcome to try an NSAID for this.  He denies any intervention that is more potent/aggressive.  He asks about Super-Beta Prostate.  I have reviewed the ingredients of this intervention and I do not see any issues at this time.  He has a free sample at home.  He has concerns regarding Xarelto price.  He is working with Dr. BHarl Bowieregarding this.  Return in 2 weeks for treatment and follow-up appointment with continuation of chemotherapy as planned.    ORDERS PLACED FOR THIS ENCOUNTER: Orders Placed This  Encounter  Procedures  . DG Pelvis 1-2 Views    MEDICATIONS PRESCRIBED THIS ENCOUNTER: No orders of the defined types were placed in this encounter.    THERAPY PLAN:  Continue with 4 cycles of chemotherapy as planned.  All questions were answered. The patient knows to call the clinic with any problems, questions or concerns. We can certainly see the patient much sooner if necessary.  Patient and plan discussed with Dr. Ancil Linsey and she is in agreement with the aforementioned.   This note is electronically signed by: Doy Mince 05/30/2016 11:49 AM

## 2016-05-30 NOTE — Assessment & Plan Note (Addendum)
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  Now on systemic chemotherapy consisting of Carboplatin/Abraxane beginning on 03/07/2016.  PDL1- proportion score of 50- 60% (high expression)  Oncology history is updated.  Pre-treatment labs as ordered today.  Labs meet treatment parameters.  His anemia is noted and stable compared to 2 weeks ago.  He is resistant to PRBC transfusion and he is currently asymptomatic.  We will need to monitor moving forward.  He is not an ESA candidate due to "curative" intent of treatment.  Bone scan demonstrates an abnormality in the left iliac wing.  Recommended plain pelvic films and therefore imaging orders are placed for this.  This will be completed today following completion of chemotherapy. DG pelvis 1-2 views.  He is tolerating treatment well.  His weight is stable.  He denies any nausea/vomiting. His appetite is strong.  We have ascertained PDL1 testing results from [facility] and this is added to his oncology history as noted above. We will call [facility] today and see if we can finally ascertain ALK/EGFR results.  If not, as this has been an ongoing issue, we will perform a Guardant360 to evaluate ALK/EGFR status.  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  Given curative intent treatment, he is NOT a candidate for ESA therapy.  He notes a dry cough that is not productive.  He is afebrile.  No signs or symptoms of infection.  He declines a cough suppressant.    He notes some left buttock and posterior aspect of upper leg discomfort that has been ongoing for some time.  He is welcome to try an NSAID for this.  He denies any intervention that is more potent/aggressive.  He asks about Super-Beta Prostate.  I have reviewed the  ingredients of this intervention and I do not see any issues at this time.  He has a free sample at home.  He has concerns regarding Xarelto price.  He is working with Dr. Harl Bowie regarding this.  Return in 2 weeks for treatment and follow-up appointment with continuation of chemotherapy as planned.

## 2016-05-30 NOTE — Patient Instructions (Signed)
[place] at [facility] Discharge Instructions  RECOMMENDATIONS MADE BY THE CONSULTANT AND ANY TEST RESULTS WILL BE SENT TO YOUR REFERRING PHYSICIAN.  Exam done and seen today by Kirby Crigler PA-C Need to get Pelvic xray today after treatment Labs reviewed today Treatment today Return to see the doctor in 2 weeks with [name] Please call the clinic if you have any questions or concerns  Thank you for choosing [place] at [facility] to provide your oncology and hematology care.  To afford each patient quality time with our provider, please arrive at least 15 minutes before your scheduled appointment time.   Beginning January 23rd 2017 lab work for the [facility] will be done in the  Main lab at [facility] on 1st floor. If you have a lab appointment with the [place] please come in thru the  Main Entrance and check in at the main information desk  You need to re-schedule your appointment should you arrive 10 or more minutes late.  We strive to give you quality time with our providers, and arriving late affects you and other patients whose appointments are after yours.  Also, if you no show three or more times for appointments you may be dismissed from the clinic at the providers discretion.     Again, thank you for choosing [facility].  Our hope is that these requests will decrease the amount of time that you wait before being seen by our physicians.       _____________________________________________________________  Should you have questions after your visit to [facility], please contact our office at [phone] between the hours of 8:30 a.m. and 4:30 p.m.  Voicemails left after 4:30 p.m. will not be returned until the following business day.  For prescription refill requests, have your pharmacy contact our office.         Resources For Cancer Patients and their Caregivers ? American Cancer  Society: Can assist with transportation, wigs, general needs, runs Look Good Feel Better.        587-582-0319 ? Cancer Care: Provides financial assistance, online support groups, medication/co-pay assistance.  1-800-813-HOPE 551-315-6356) ? [place] Assists [place] cancer patients and their families through emotional , educational and financial support.  612-850-9537 ? [place] DSS Where to apply for food stamps, Medicaid and utility assistance. 8481536262 ? RCATS: Transportation to medical appointments. 778-127-2069 ? Social Security Administration: May apply for disability if have a Stage IV cancer. 224-597-9889 870-857-9796 ? [employer], Disability and Transit Services: Assists with nutrition, care and transit needs. [place] Support Programs: '@10RELATIVEDAYS'$ @ > Cancer Support Group  2nd Tuesday of the month 1pm-2pm, Journey Room  > Creative Journey  3rd Tuesday of the month 1130am-1pm, Journey Room  > Look Good Feel Better  1st Wednesday of the month 10am-12 noon, Journey Room (Call [place] to register [phone])

## 2016-05-30 NOTE — Progress Notes (Signed)
1315:  Tolerated tx w/o adverse reaction.  VSS.  A&Ox4, in no distress.  Discharged ambulatory in c/o sister for transport home.

## 2016-06-02 ENCOUNTER — Telehealth: Payer: Self-pay | Admitting: Cardiology

## 2016-06-02 ENCOUNTER — Other Ambulatory Visit (HOSPITAL_COMMUNITY): Payer: Self-pay | Admitting: Oncology

## 2016-06-02 DIAGNOSIS — C3490 Malignant neoplasm of unspecified part of unspecified bronchus or lung: Secondary | ICD-10-CM

## 2016-06-06 ENCOUNTER — Inpatient Hospital Stay (HOSPITAL_COMMUNITY): Payer: Medicare PPO

## 2016-06-09 ENCOUNTER — Other Ambulatory Visit (HOSPITAL_COMMUNITY): Payer: Self-pay | Admitting: Hematology & Oncology

## 2016-06-10 ENCOUNTER — Ambulatory Visit (HOSPITAL_COMMUNITY)
Admission: RE | Admit: 2016-06-10 | Discharge: 2016-06-10 | Disposition: A | Discharge status: Home / Self Care | Payer: Medicare PPO | Source: Ambulatory Visit | Attending: Oncology | Admitting: Oncology

## 2016-06-10 ENCOUNTER — Telehealth: Payer: Self-pay | Admitting: *Deleted

## 2016-06-10 ENCOUNTER — Other Ambulatory Visit (HOSPITAL_COMMUNITY): Payer: Self-pay | Admitting: Oncology

## 2016-06-10 DIAGNOSIS — I700 Atherosclerosis of aorta: Secondary | ICD-10-CM | POA: Insufficient documentation

## 2016-06-10 DIAGNOSIS — R590 Localized enlarged lymph nodes: Secondary | ICD-10-CM | POA: Insufficient documentation

## 2016-06-10 DIAGNOSIS — I2510 Atherosclerotic heart disease of native coronary artery without angina pectoris: Secondary | ICD-10-CM | POA: Insufficient documentation

## 2016-06-10 DIAGNOSIS — C3490 Malignant neoplasm of unspecified part of unspecified bronchus or lung: Secondary | ICD-10-CM | POA: Diagnosis present

## 2016-06-10 DIAGNOSIS — I714 Abdominal aortic aneurysm, without rupture: Secondary | ICD-10-CM | POA: Insufficient documentation

## 2016-06-10 DIAGNOSIS — C3491 Malignant neoplasm of unspecified part of right bronchus or lung: Secondary | ICD-10-CM

## 2016-06-10 DIAGNOSIS — C7951 Secondary malignant neoplasm of bone: Secondary | ICD-10-CM | POA: Insufficient documentation

## 2016-06-10 LAB — GLUCOSE, CAPILLARY: GLUCOSE-CAPILLARY: 112 mg/dL — AB (ref 65–99)

## 2016-06-10 MED ORDER — LORAZEPAM 1 MG PO TABS: 1.0000 mg | ORAL_TABLET | Freq: Three times a day (TID) | ORAL | Status: DC | PRN

## 2016-06-10 MED ORDER — OXYCODONE HCL 5 MG PO TABS: 5.0000 mg | ORAL_TABLET | Freq: Four times a day (QID) | ORAL | Status: DC | PRN

## 2016-06-10 MED ORDER — FLUDEOXYGLUCOSE F - 18 (FDG) INJECTION
8.1000 | Freq: Once | INTRAVENOUS | Status: AC | PRN
  Administered 2016-06-10: 8.1 via INTRAVENOUS

## 2016-06-10 NOTE — Telephone Encounter (Signed)
That's good new. He still needs to be on something similar to help strengthen his heart. Please start losartan 12.'5mg'$  daily, this has the same benefits without causing a cough.  Zandra Abts MD

## 2016-06-10 NOTE — Telephone Encounter (Signed)
Daughter states that patient has stopped coughing after he stopped taking LISINOPRIL.

## 2016-06-11 ENCOUNTER — Telehealth (HOSPITAL_COMMUNITY): Payer: Self-pay | Admitting: *Deleted

## 2016-06-11 MED ORDER — LOSARTAN POTASSIUM 25 MG PO TABS: 12.5000 mg | ORAL_TABLET | Freq: Every day | ORAL | Status: AC

## 2016-06-12 ENCOUNTER — Telehealth: Payer: Self-pay | Admitting: Cardiology

## 2016-06-12 NOTE — Telephone Encounter (Signed)
Spoke to pt's sister. Let her know we will accept the [facility] printout to send in with his medication.

## 2016-06-12 NOTE — Telephone Encounter (Signed)
pls call pt's sister concerning the form for his medication assistance

## 2016-06-13 ENCOUNTER — Encounter (HOSPITAL_BASED_OUTPATIENT_CLINIC_OR_DEPARTMENT_OTHER): Payer: Medicare PPO

## 2016-06-13 ENCOUNTER — Inpatient Hospital Stay (HOSPITAL_COMMUNITY): Payer: Medicare PPO

## 2016-06-13 ENCOUNTER — Encounter (HOSPITAL_BASED_OUTPATIENT_CLINIC_OR_DEPARTMENT_OTHER): Payer: Medicare PPO | Admitting: Hematology & Oncology

## 2016-06-13 ENCOUNTER — Encounter (HOSPITAL_COMMUNITY): Payer: Self-pay | Admitting: Hematology & Oncology

## 2016-06-13 VITALS — BP 132/68 | HR 96 | Temp 98.0°F | Resp 18

## 2016-06-13 VITALS — BP 138/80 | HR 94 | Temp 98.0°F | Resp 18 | Wt 140.1 lb

## 2016-06-13 DIAGNOSIS — C3491 Malignant neoplasm of unspecified part of right bronchus or lung: Secondary | ICD-10-CM

## 2016-06-13 DIAGNOSIS — C7951 Secondary malignant neoplasm of bone: Secondary | ICD-10-CM | POA: Diagnosis not present

## 2016-06-13 DIAGNOSIS — M545 Low back pain, unspecified: Secondary | ICD-10-CM

## 2016-06-13 DIAGNOSIS — Z5112 Encounter for antineoplastic immunotherapy: Secondary | ICD-10-CM

## 2016-06-13 DIAGNOSIS — M25552 Pain in left hip: Secondary | ICD-10-CM

## 2016-06-13 DIAGNOSIS — G893 Neoplasm related pain (acute) (chronic): Secondary | ICD-10-CM

## 2016-06-13 DIAGNOSIS — D630 Anemia in neoplastic disease: Secondary | ICD-10-CM

## 2016-06-13 LAB — CBC WITH DIFFERENTIAL/PLATELET
BASOS ABS: 0 10*3/uL (ref 0.0–0.1)
BASOS PCT: 0 %
EOS ABS: 0 10*3/uL (ref 0.0–0.7)
EOS PCT: 1 %
HCT: 26.5 % — ABNORMAL LOW (ref 39.0–52.0)
Hemoglobin: 8.7 g/dL — ABNORMAL LOW (ref 13.0–17.0)
Lymphocytes Relative: 11 %
Lymphs Abs: 0.4 10*3/uL — ABNORMAL LOW (ref 0.7–4.0)
MCH: 33.7 pg (ref 26.0–34.0)
MCHC: 32.8 g/dL (ref 30.0–36.0)
MCV: 102.7 fL — AB (ref 78.0–100.0)
MONO ABS: 1 10*3/uL (ref 0.1–1.0)
Monocytes Relative: 23 %
Neutro Abs: 2.7 10*3/uL (ref 1.7–7.7)
Neutrophils Relative %: 65 %
PLATELETS: 241 10*3/uL (ref 150–400)
RBC: 2.58 MIL/uL — AB (ref 4.22–5.81)
RDW: 18.6 % — AB (ref 11.5–15.5)
WBC: 4.1 10*3/uL (ref 4.0–10.5)

## 2016-06-13 LAB — COMPREHENSIVE METABOLIC PANEL
ALT: 22 U/L (ref 17–63)
AST: 23 U/L (ref 15–41)
Albumin: 3.2 g/dL — ABNORMAL LOW (ref 3.5–5.0)
Alkaline Phosphatase: 66 U/L (ref 38–126)
Anion gap: 8 (ref 5–15)
BUN: 9 mg/dL (ref 6–20)
CHLORIDE: 100 mmol/L — AB (ref 101–111)
CO2: 27 mmol/L (ref 22–32)
Calcium: 9.5 mg/dL (ref 8.9–10.3)
Creatinine, Ser: 0.59 mg/dL — ABNORMAL LOW (ref 0.61–1.24)
GFR calc Af Amer: >60 mL/min (ref >60)
Glucose, Bld: 139 mg/dL — ABNORMAL HIGH (ref 65–99)
POTASSIUM: 3.8 mmol/L (ref 3.5–5.1)
SODIUM: 135 mmol/L (ref 135–145)
Total Bilirubin: 0.4 mg/dL (ref 0.3–1.2)
Total Protein: 6.9 g/dL (ref 6.5–8.1)

## 2016-06-13 MED ORDER — FENTANYL 12 MCG/HR TD PT72: 12.0000 ug | MEDICATED_PATCH | TRANSDERMAL | Status: DC

## 2016-06-13 MED ORDER — SODIUM CHLORIDE 0.9% FLUSH: 10.0000 mL | INTRAVENOUS | Status: DC | PRN

## 2016-06-13 MED ORDER — PEMBROLIZUMAB CHEMO INJECTION 100 MG/4ML
200.0000 mg | Freq: Once | INTRAVENOUS | Status: AC
  Administered 2016-06-13: 200 mg via INTRAVENOUS
  Filled 2016-06-13: qty 8

## 2016-06-13 MED ORDER — HEPARIN SOD (PORK) LOCK FLUSH 100 UNIT/ML IV SOLN
500.0000 [IU] | Freq: Once | INTRAVENOUS | Status: AC | PRN
  Administered 2016-06-13: 500 [IU]

## 2016-06-13 MED ORDER — PREDNISONE 20 MG PO TABS: ORAL_TABLET | ORAL | Status: DC

## 2016-06-13 MED ORDER — HEPARIN SOD (PORK) LOCK FLUSH 100 UNIT/ML IV SOLN
INTRAVENOUS | Status: AC
  Filled 2016-06-13: qty 5

## 2016-06-13 MED ORDER — SODIUM CHLORIDE 0.9 % IV SOLN
Freq: Once | INTRAVENOUS | Status: AC
  Administered 2016-06-13: 12:00:00 via INTRAVENOUS

## 2016-06-13 NOTE — Patient Instructions (Signed)
[place] at [facility] Discharge Instructions  RECOMMENDATIONS MADE BY THE CONSULTANT AND ANY TEST RESULTS WILL BE SENT TO YOUR REFERRING PHYSICIAN.  We discussed the following: Please use caution with physical activity, use a cane with walking Please take calcium plus vitamin D daily You will be receiving an appointment with Dr. Lisbeth Renshaw in [facility] for Radiation I have started you on a fentanyl patch, if in 48 hours you have no pain relief, remove the patch and apply 2 patches.  You may use your short acting pain medicine as written while on the patch  Thank you for choosing [place] at [facility] to provide your oncology and hematology care.  To afford each patient quality time with our provider, please arrive at least 15 minutes before your scheduled appointment time.   Beginning January 23rd 2017 lab work for the [facility] will be done in the  Main lab at [facility] on 1st floor. If you have a lab appointment with the [facility] please come in thru the  Main Entrance and check in at the main information desk  You need to re-schedule your appointment should you arrive 10 or more minutes late.  We strive to give you quality time with our providers, and arriving late affects you and other patients whose appointments are after yours.  Also, if you no show three or more times for appointments you may be dismissed from the clinic at the providers discretion.     Again, thank you for choosing [facility].  Our hope is that these requests will decrease the amount of time that you wait before being seen by our physicians.       _____________________________________________________________  Should you have questions after your visit to [facility], please contact our office at [phone] between the hours of 8:30 a.m. and 4:30 p.m.  Voicemails left after 4:30 p.m. will not be returned until the following business day.   For prescription refill requests, have your pharmacy contact our office.         Resources For Cancer Patients and their Caregivers ? American Cancer Society: Can assist with transportation, wigs, general needs, runs Look Good Feel Better.        805-341-4095 ? Cancer Care: Provides financial assistance, online support groups, medication/co-pay assistance.  1-800-813-HOPE 308-503-0581) ? [place] Assists [place] cancer patients and their families through emotional , educational and financial support.  (614)885-0861 ? [place] DSS Where to apply for food stamps, Medicaid and utility assistance. (845) 454-7719 ? RCATS: Transportation to medical appointments. 336 634 4835 ? Social Security Administration: May apply for disability if have a Stage IV cancer. (219) 272-6964 (510)738-4307 ? [employer], Disability and Transit Services: Assists with nutrition, care and transit needs. [place] Support Programs: '@10RELATIVEDAYS'$ @ > Cancer Support Group  2nd Tuesday of the month 1pm-2pm, Journey Room  > Creative Journey  3rd Tuesday of the month 1130am-1pm, Journey Room  > Look Good Feel Better  1st Wednesday of the month 10am-12 noon, Journey Room (Call [place] to register [phone])

## 2016-06-13 NOTE — Progress Notes (Signed)
Patient tolerated infusion well.  VSS.   

## 2016-06-13 NOTE — Progress Notes (Signed)
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 (Incompletely staged) EBUS FNA subcarinal mass, squamous cell carcinoma 01/01/2016 RT for palliation of central lung NSCLC at [place] started on 01/11/2016 Dysphagis, mild oral and mild moderate pharyngeal dysphagia Paroxysmal afib with EF 35% on ECHO 01/22/2016 Intractable hiccups on Baclofen 10 mg po tid Anemia, chronic disease Hyponatremia Acute hypoxic respiratory failure, COPD History of CVA with residual L sided weakness Urgent XRT secondary to airway compromise from NSCLC    Squamous cell carcinoma of lung, stage IV ([place])   01/01/2016 Pathology Results EBUS FNA subcarinal mass, squamous cell carcinoma   01/03/2016 Pathology Results ALK negative.  ROS1 negative.   01/11/2016 -  Radiation Therapy RT for palliation of central lung NSCLC at [place] started on 01/11/2016   01/17/2016 Pathology Results PD-L1 shows high expression- proportion score of 50- 60%   03/07/2016 -  Chemotherapy Carboplatin/Abraxane day 1, 8, 15 every 28 day   03/12/2016 Imaging CT chest- Pulmonary parenchymal pattern of peribronchovascular ground-glass, nodularity and consolidation is likely new or progressive from chest radiograph 02/27/2016 and may be due to an infectious bronchiolitis/bronchopneumonia.    04/29/2016 Imaging CT chest- Continued decreased size of the infiltrative subcarinal mass. Significant decrease in patchy  basilar-predominant nodular interlobular and peribronchovascular interstitial thickening, favoring decreasing lymphangitic tumor.   05/29/2016 Imaging Bone scan- Focal photopenic abnormality in the left iliac wing. Suspect lytic bone lesion or less likely, artifact.   05/30/2016 Imaging Pelvis  xray- Large lytic lesions in the left iliac wing and proximal left femur consistent with metastatic disease.   05/30/2016 Imaging CT ADDENDUM: Further review of the 04/29/16 chest CT was performed in light of the L pelvic lytic metastases identified on 05/30/16 There is a slightly expansile lytic osseous metastasis in the right T4 pedicle/pars interarticularis new since 03/12/16   05/30/2016 Progression Imaging demonstrates new lesions and CT scan was re-reviewed after new left iliac wing lytic lesion.  Review demonstrates a new right T4 lytic lesion new since 03/12/2016.     HISTORY OF PRESENTING ILLNESS:  Tyler Aguilar 60 y.o. male is here for follow-up of stage IV squamous cell carcinoma of the lung. He was initially clinically staged at [facility] as staged III (T1N2M0) Squamous cell carcinoma of the R lung. He was initially treated with emergent XRT at [facility] due to airway compromise. Chemotherapy with carboplatin/abraxane was given sequentially. He progressed through therapy. Disease is PDL1 positive with high expression.   [name] returns to the [facility] today accompanied by his sister. He is here to review PET imaging.   Mr. Wander is accompanied by his sister. I personally reviewed and went over current prognosis and future treatment options with the patient and his sister at length.   He reports bone pain associated with disease progression. He has been laying on the couch a lot. He uses a cane to avoid putting too much weight on his knee while ambulating. He began to feel a stabbing pain in his back on Tuesday morning when standing up from the toilet. He takes ativan to alleviate it. Last night, while bending over a certain way to get clothes out of the laundry he experienced pain in his back. He was able to finish the laundry by bending another way.  He denies any problems with his teeth, noting most of his teeth are false. He has all false teeth on top and partial on the bottom.   He does  not take his oxycodone regularly and would rather only take them as much as he absolutely has too. Admits he is stubborn. His sister notes he has been in a lot of pain over the last few days. He is reluctant to use pain medication.   His sister states she added Vitamin D supplements recently to his regular medications.    He is here today to start therapy with [place] and for further education.   MEDICAL HISTORY:  Past Medical History  Diagnosis Date  . Stroke [facility])     May 2009 - R medullary CVA w L-sided weakness, sensory loss, and dysarhria/dysphagia  . COPD (chronic obstructive pulmonary disease) ([place])   . AAA (abdominal aortic aneurysm) (H[facility])     4.5 cm December 2016  . Dyslipidemia   . Lung cancer ([facility])     Stage III squamous cell, right lung  . Cardiomyopathy ([place])     LVEF 35% February 2017 [facility]  . PAF (paroxysmal atrial fibrillation) (HCC)     Versus SVT documented February 2017 [facility]    SURGICAL HISTORY: Past Surgical History  Procedure Laterality Date  . Finger surgery Right 1979  . Colonoscopy  08/31/2012    Procedure: COLONOSCOPY;  Surgeon: Jamesetta So, MD;  Location: [facility] END[facility] SUITE;  Service: Gastroenterology;  Laterality: N/A;  . Bronchoscopy    . Portacath placement Left 02/27/2016    Procedure: INSERTION PORT-A-CATH LEFT SUBCLAVIAN;  Surgeon: Aviva Signs, MD;  Location: [facility] ORS;  Service: General;  Laterality: Left;    SOCIAL HISTORY: Social History   Social History  . Marital Status: Married    Spouse Name: N/A  . Number of Children: N/A  . Years of Education: N/A   Occupational History  . Not on file.   Social History Main Topics  . Smoking status: Former Smoker    Types: Cigarettes    Quit date: 12/31/2015  . Smokeless tobacco: Not on file  . Alcohol Use: 0.0 oz/week    0 Standard drinks or equivalent per week  . Drug Use: No  . Sexual Activity: Not on file   Other Topics Concern  . Not on file   Social History Narrative    Currently going through a divorce 0 children Ex smoker. Previously worked as a Dealer.  FAMILY HISTORY: Family History  Problem Relation Age of Onset  . Diabetes Mellitus II Mother   . Cancer Mother   . Cancer Father   . Coronary artery disease Brother     CABG   has no family status information on file.   Father died of a hematoma of the liver at 59 yo Mother died of heart failure at 49 yo  ALLERGIES:  has No Known Allergies.  MEDICATIONS:  Current Outpatient Prescriptions  Medication Sig Dispense Refill  . aspirin 81 MG tablet Take 81 mg by mouth daily.    Marland Kitchen atorvastatin (LIPITOR) 80 MG tablet Take 80 mg by mouth every evening.    . budesonide-formoterol (SYMBICORT) 160-4.5 MCG/ACT inhaler Inhale 2 puffs into the lungs 2 (two) times daily.    . carvedilol (COREG) 3.125 MG tablet Take 1 tablet (3.125 mg total) by mouth 2 (two) times daily. 180 tablet 3  . cholecalciferol (VITAMIN D) 1000 units tablet Take 1,000 Units by mouth daily.    Marland Kitchen  escitalopram (LEXAPRO) 10 MG tablet Take 1 tablet (10 mg total) by mouth daily. 90 tablet 1  . lidocaine-prilocaine (EMLA) cream Apply a quarter size amount to port site 1 hour prior to chemo. Do not rub in. Cover with plastic wrap. 30 g 3  . LORazepam (ATIVAN) 1 MG tablet Take 1 tablet (1 mg total) by mouth 3 (three) times daily as needed for anxiety. 60 tablet 0  . losartan (COZAAR) 25 MG tablet Take 0.5 tablets (12.5 mg total) by mouth daily. 45 tablet 3  . Omega-3 Fatty Acids (FISH OIL) 1000 MG CAPS Take by mouth. Pt and sister unsure of dosage    . oxyCODONE (ROXICODONE) 5 MG immediate release tablet Take 1 tablet (5 mg total) by mouth every 6 (six) hours as needed for severe pain. 60 tablet 0  . pantoprazole (PROTONIX) 40 MG tablet Take 1 tablet (40 mg total) by mouth daily. 90 tablet 1  . pyridOXINE (VITAMIN B-6) 50 MG tablet Take 50 mg by mouth daily.    . ranitidine (ZANTAC) 150 MG tablet Take 1 tablet (150 mg total) by mouth 2 (two)  times daily. 180 tablet 1  . rivaroxaban (XARELTO) 20 MG TABS tablet Take 1 tablet (20 mg total) by mouth daily. 90 tablet 2  . Spacer/Aero-Holding Chambers (AEROCHAMBER PLUS FLO-VU) MISC     . SPIRIVA RESPIMAT 2.5 MCG/ACT AERS INHALE 2 PUFFS INTO LUNGS DAILY 1 Inhaler 3  . CARBOPLATIN IV Inject into the vein. To start 3/24    . fexofenadine (ALLEGRA ALLERGY) 180 MG tablet Take 1 tablet (180 mg total) by mouth daily. (Patient not taking: Reported on 06/13/2016) 30 tablet 3  . PACLitaxel Protein-Bound Part (ABRAXANE IV) Inject into the vein. To start 3/24.     No current facility-administered medications for this visit.    Review of Systems  Constitutional: Negative for weight loss.  HENT: Positive for nosebleeding. Nose blood clots associated with aspirin Eyes: Negative.   Respiratory: Negative Cardiovascular: Negative.   Gastrointestinal: Negative.  Negative for diarrhea.  Genitourinary: Negative.   Musculoskeletal: Positive for back, joint, and bone pain. Negative for falls. Skin: Negative.   Neurological: Left leg weakness, chronic since prior CVA. LL pain Endo/Heme/Allergies: Negative.   Psychiatric/Behavioral: Currently denies All other systems reviewed and are negative. 14 point ROS was done and is otherwise as detailed above or in HPI   PHYSICAL EXAMINATION: ECOG PERFORMANCE STATUS: 1 - Symptomatic but completely ambulatory  Filed Vitals:   06/13/16 1004  BP: 138/80  Pulse: 94  Temp: 98 F (36.7 C)  Resp: 18   Filed Weights   06/13/16 1004  Weight: 140 lb 1.6 oz (63.549 kg)    Physical Exam  Constitutional: He is oriented to person, place, and time and well-developed, well-nourished, and in no distress. Well groomed Ambulated to the clinic today, with a cane. Thinning hair HENT:  Head: Normocephalic and atraumatic.  Nose: Nose normal.  Mouth/Throat: Oropharynx is clear and moist. No oropharyngeal exudate.  Eyes: Conjunctivae and EOM are normal. Pupils are  equal, round, and reactive to light. Right eye exhibits no discharge. Left eye exhibits no discharge. No scleral icterus.  Neck: Normal range of motion. Neck supple. No tracheal deviation present. No thyromegaly present.  Cardiovascular: Normal rate, regular rhythm and normal heart sounds.  Exam reveals no gallop and no friction rub.   No murmur heard. Pulmonary/Chest: Effort normal. He has no wheezes. He has no rales.  Abdominal: Soft. Bowel sounds are normal. He exhibits  no distension and no mass. There is no tenderness. There is no rebound and no guarding.  Musculoskeletal: Normal range of motion. He exhibits no edema.  Lymphadenopathy:    He has no cervical adenopathy.  Neurological: He is alert and oriented to person, place, and time. No cranial nerve deficit.  Skin: Skin is warm and dry. No rash noted.  Psychiatric: Mood, memory, affect and judgment normal.  Nursing note and vitals reviewed.  LABORATORY DATA:  I have reviewed the data as listed  CBC    Component Value Date/Time   WBC 8.6 05/30/2016 1000   RBC 2.44* 05/30/2016 1000   HGB 8.5* 05/30/2016 1000   HCT 25.3* 05/30/2016 1000   PLT 143* 05/30/2016 1000   MCV 103.7* 05/30/2016 1000   MCH 34.8* 05/30/2016 1000   MCHC 33.6 05/30/2016 1000   RDW 20.7* 05/30/2016 1000   LYMPHSABS 0.4* 05/30/2016 1000   MONOABS 0.6 05/30/2016 1000   EOSABS 0.0 05/30/2016 1000   BASOSABS 0.0 05/30/2016 1000   CMP     Component Value Date/Time   NA 131* 05/30/2016 1000   K 3.8 05/30/2016 1000   CL 100* 05/30/2016 1000   CO2 25 05/30/2016 1000   GLUCOSE 104* 05/30/2016 1000   BUN 10 05/30/2016 1000   CREATININE 0.62 05/30/2016 1000   CALCIUM 8.6* 05/30/2016 1000   PROT 6.6 05/30/2016 1000   ALBUMIN 3.2* 05/30/2016 1000   AST 21 05/30/2016 1000   ALT 19 05/30/2016 1000   ALKPHOS 73 05/30/2016 1000   BILITOT 0.7 05/30/2016 1000   GFRNONAA >60 05/30/2016 1000   GFRAA >60 05/30/2016 1000      RADIOGRAPHIC STUDIES: I have  personally reviewed the radiological images as listed and agreed with the findings in the report. Study Result     CLINICAL DATA: Initial treatment strategy for lung cancer.  EXAM: NUCLEAR MEDICINE PET SKULL BASE TO THIGH  TECHNIQUE: 8.1 mCi F-18 FDG was injected intravenously. Full-ring PET imaging was performed from the skull base to thigh after the radiotracer. CT data was obtained and used for attenuation correction and anatomic localization.  FASTING BLOOD GLUCOSE: Value: 112 mg/dl  COMPARISON: 04/29/2016  FINDINGS: NECK  No hypermetabolic lymph nodes in the neck.  CHEST  Normal heart size. Aortic atherosclerosis is identified. Calcifications involving the RCA, LAD and left circumflex coronary artery. Sub- carinal mass measures 2.7 cm and has an SUV max equal to 18.07.  No pleural effusion identified. Part solid lesion within the right upper lobe measures 2.7 cm and has an SUV max equal to 6.2. The part solid lesion involving the superior segment of the right lower lobe measures 1.7 cm and has an SUV max equal to 6.14, image 41 of series 6. Faint ground-glass nodule within the left upper lobe measures 7 mm and has an SUV max equal to 1.42, image 30 of series 6.  ABDOMEN/PELVIS  No abnormal hypermetabolic activity within the liver, pancreas, adrenal glands, or spleen. Infrarenal abdominal aortic aneurysm has a maximum AP diameter of 4.1 cm. No hypermetabolic lymph nodes in the abdomen or pelvis.  SKELETON  Multifocal hypermetabolic bone metastases are identified. Expansile and lytic lesion involving the posterior elements of the T4 vertebra measures approximately 3.8 cm and has an SUV max equal to 27.7. This appears to involve the L4-5 neuroforamina on the right, image 59 of series 4. Cannot rule out canal involvement at this level. Large destructive lesion involving the left iliac wing measures 7.1 cm and has an  SUV max equal to 23.08.  Paraspinal intramuscular lesion is identified posterior to the L5 vertebra. This measures approximately 2.2 cm and has an SUV max equal to 20.32. Finally, there is a destructive lesion involving the posterior cortex of the proximal left femur measuring 2.8 cm. SUV max equals 222.2.  IMPRESSION: 1. Multifocal sub solid lesions involving the lungs are intensely hypermetabolic and compatible with primary bronchogenic carcinoma 2. Hypermetabolic sub- carinal lymph node compatible with metastatic adenopathy. 3. Multifocal lytic bone metastases are identified. Lesion involving the posterior elements of the T4 vertebra may be of neurologic significance. The lesion involving the posterior cortex of the left femur may be of orthopedic significance. 4. Aortic atherosclerosis and multi vessel coronary artery calcification. 5. Infrarenal abdominal aortic aneurysm is noted measuring 4.1 cm. Recommend followup by [place] in 1 year. This recommendation follows ACR consensus guidelines: White Paper of the ACR Incidental Findings Committee II on Vascular Findings. J Am Coll Radiol 2013; 10:789-794.   Electronically Signed  By: Kerby Moors M.D.  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 L1 positive EBUS FNA subcarinal mass, squamous cell carcinoma 01/01/2016 RT for palliation of central lung NSCLC at [place] started on 01/11/2016 Dysphagis, mild oral and mild moderate pharyngeal dysphagia Paroxysmal afib with EF 35% on ECHO 01/22/2016 Intractable hiccups on Baclofen 10 mg po tid Anemia, chronic disease Hyponatremia Acute hypoxic respiratory failure, COPD History of CVA with residual L sided weakness Urgent XRT secondary to airway compromise from NSCLC Chemotherapy induced anemia Carboplatin/Abraxane with progression    I personally reviewed and went over current prognosis, disease progression, and future treatment  options with the patient. We discussed radiation oncology referral and he is agreeable. I have had Dr. Tammi Klippel review recent PET/CT.    The patient unfortunately progressed through carboplatin/abraxane.  He will begin [facility] immunotherapy.  He was given a wallet card describing this medication; I reviewed this information including side effects to watch out for with the patient at length. He and his sister were advised of side effects of concern such as pneumonitis, colitis. We reviewed the use of prednisone to manage these side effects.   Given his bone metastases we also discussed Xgeva or Zometa. I advised for the patient to add calcium and Vitamin D supplements to his regular medications.  The patient would prefer to go to [facility] for radiation treatment than [facility]. He will meet with Dr. Lisbeth Renshaw next week in [place].   I have written the patient a prescription for fentanyl patch to better control his pain. I advised he can still take oxycodone if needed. I reviewed the use of long acting and short acting pain medications in detail.   To complete staging I have ordered a brain MRI.  He will return for follow up in 3 weeks, sooner if needed. I have emphasized the importance of calling with any concerns or problems over the next few weeks prior to follow-up  All questions were answered. The patient knows to call the clinic with any problems, questions or concerns.  This document serves as a record of services personally performed by Ancil Linsey, MD. It was created on her behalf by Arlyce Harman, a trained medical scribe. The creation of this record is based on the scribe's personal observations and the provider's statements to them. This document has been checked and approved by the attending provider.  I have reviewed the above  documentation for accuracy and completeness, and I agree with the above.  This note was electronically signed.  Molli Hazard,  MD   06/13/2016 10:36 AM

## 2016-06-13 NOTE — Patient Instructions (Signed)
[facility] Discharge Instructions for Patients Receiving Chemotherapy   Beginning January 23rd 2017 lab work for the [facility] will be done in the  Main lab at [facility] on 1st floor. If you have a lab appointment with the [facility] please come in thru the  Main Entrance and check in at the main information desk   Today you received the following chemotherapy agent: Keytruda.   Please see MD AVS for more information.     If you develop nausea and vomiting, or diarrhea that is not controlled by your medication, call the clinic.  The clinic phone number is [phone]. Office hours are Monday-Friday 8:30am-5:00pm.  BELOW ARE SYMPTOMS THAT SHOULD BE REPORTED IMMEDIATELY:  *FEVER GREATER THAN 101.0 F  *CHILLS WITH OR WITHOUT FEVER  NAUSEA AND VOMITING THAT IS NOT CONTROLLED WITH YOUR NAUSEA MEDICATION  *UNUSUAL SHORTNESS OF BREATH  *UNUSUAL BRUISING OR BLEEDING  TENDERNESS IN MOUTH AND THROAT WITH OR WITHOUT PRESENCE OF ULCERS  *URINARY PROBLEMS  *BOWEL PROBLEMS  UNUSUAL RASH Items with * indicate a potential emergency and should be followed up as soon as possible. If you have an emergency after office hours please contact your primary care physician or go to the nearest emergency department.  Please call the clinic during office hours if you have any questions or concerns.   You may also contact the Patient Navigator at [phone] should you have any questions or need assistance in obtaining follow up care.      Resources For Cancer Patients and their Caregivers ? American Cancer Society: Can assist with transportation, wigs, general needs, runs Look Good Feel Better.        9123008267 ? Cancer Care: Provides financial assistance, online support groups, medication/co-pay assistance.  1-800-813-HOPE 727-692-0710) ? [facility] Assists [place] cancer patients and their families through emotional , educational  and financial support.  470-653-6739 ? [place] DSS Where to apply for food stamps, Medicaid and utility assistance. (520)796-6349 ? [facility]: Transportation to medical appointments. (403)512-1644 ? Social Security Administration: May apply for disability if have a Stage IV cancer. 986-528-6631 5106284664 ? [employer], Disability and Transit Services: Assists with nutrition, care and transit needs. [phone]

## 2016-06-16 ENCOUNTER — Ambulatory Visit
Admission: RE | Admit: 2016-06-16 | Discharge: 2016-06-16 | Disposition: A | Discharge status: Home / Self Care | Payer: Medicare PPO | Source: Ambulatory Visit | Attending: Radiation Oncology | Admitting: Radiation Oncology

## 2016-06-16 ENCOUNTER — Telehealth (HOSPITAL_COMMUNITY): Payer: Self-pay | Admitting: *Deleted

## 2016-06-16 ENCOUNTER — Encounter: Payer: Self-pay | Admitting: Radiation Oncology

## 2016-06-16 VITALS — BP 110/67 | HR 96 | Temp 97.9°F | Resp 18 | Ht 65.0 in | Wt 140.4 lb

## 2016-06-16 DIAGNOSIS — C7951 Secondary malignant neoplasm of bone: Secondary | ICD-10-CM | POA: Insufficient documentation

## 2016-06-16 DIAGNOSIS — Z87891 Personal history of nicotine dependence: Secondary | ICD-10-CM | POA: Insufficient documentation

## 2016-06-16 DIAGNOSIS — J9601 Acute respiratory failure with hypoxia: Secondary | ICD-10-CM | POA: Insufficient documentation

## 2016-06-16 DIAGNOSIS — Z8673 Personal history of transient ischemic attack (TIA), and cerebral infarction without residual deficits: Secondary | ICD-10-CM | POA: Insufficient documentation

## 2016-06-16 DIAGNOSIS — I480 Paroxysmal atrial fibrillation: Secondary | ICD-10-CM | POA: Diagnosis not present

## 2016-06-16 DIAGNOSIS — C7949 Secondary malignant neoplasm of other parts of nervous system: Principal | ICD-10-CM

## 2016-06-16 DIAGNOSIS — Z510 Encounter for antineoplastic radiation therapy: Secondary | ICD-10-CM | POA: Insufficient documentation

## 2016-06-16 DIAGNOSIS — C3491 Malignant neoplasm of unspecified part of right bronchus or lung: Secondary | ICD-10-CM

## 2016-06-16 DIAGNOSIS — I69354 Hemiplegia and hemiparesis following cerebral infarction affecting left non-dominant side: Secondary | ICD-10-CM | POA: Diagnosis not present

## 2016-06-16 DIAGNOSIS — J449 Chronic obstructive pulmonary disease, unspecified: Secondary | ICD-10-CM | POA: Diagnosis not present

## 2016-06-16 DIAGNOSIS — C7931 Secondary malignant neoplasm of brain: Secondary | ICD-10-CM

## 2016-06-16 DIAGNOSIS — R1310 Dysphagia, unspecified: Secondary | ICD-10-CM | POA: Diagnosis not present

## 2016-06-16 DIAGNOSIS — I69991 Dysphagia following unspecified cerebrovascular disease: Secondary | ICD-10-CM | POA: Diagnosis not present

## 2016-06-16 DIAGNOSIS — C3490 Malignant neoplasm of unspecified part of unspecified bronchus or lung: Secondary | ICD-10-CM | POA: Insufficient documentation

## 2016-06-16 NOTE — Progress Notes (Signed)
Histology and Location of Primary Cancer: Non small CellStage III  Lung cancer mets to Bone Sites of Visceral and Bony Metastatic Disease:  to Left Proximal Femur,and Right Pedicle T4   Location(s) of Symptomatic Metastases: left hip back pain,   Past/Anticipated chemotherapy by medical oncology, if any: Dr. Larene Beach [place],MD note  06/13/16;   Carboplatin/Abraxane started 03/07/2016,day 1,8,& 15 every 28 days, stopped,  On Keytruda now had treatment on 06/13/16  Pain on a scale of 0-10 is: 4 mid back, left hip, right side lung    If Spine Met(s), symptoms, if any, include:  Bowel/Bladder retention or incontinence (please describe): normal bowels,  Takes colace and benefiber,  frequency  Voiding   Numbness or weakness in extremities (please describe):   Current Decadron regimen, if applicable:   Ambulatory status? Walker? Wheelchair?:Cane   SAFETY ISSUES:Yes  Prior radiation? Yes, [facility]= started  01/11/2016-02/19/2016  Palliation central lung NSCLC 60Gy   Pacemaker/ICD? NO  Is the patient on methotrexate? NO  Current Complaints / other details: ,  Hx CVA residual left sided weakness, dysphagia =,PA-Fib,COPD, acute hypoxic respiratory  Failure,former smoker, Cigarettes quit 01/14/16 Mother cancer,DM II. died of heart failure age 14,, Father cancer ,died of hematoma of the liver age 42 BP 110/67 mmHg  Pulse 96  Temp(Src) 97.9 F (36.6 C) (Oral)  Resp 18  Ht _0  (1.651 m)  Wt 140 lb 6.4 oz (63.685 kg)  BMI 23.36 kg/m2  SpO2 96%  Wt Readings from Last 3 Encounters:  06/16/16 140 lb 6.4 oz (63.685 kg)  06/13/16 140 lb 1.6 oz (63.549 kg)  05/30/16 145 lb (65.772 kg)

## 2016-06-16 NOTE — Telephone Encounter (Signed)
24h call back: Patiet's sister states that pt is doing ok post Keytruda infusion on Friday June 30. Radiation is not going to start until next week. Pt to call us for any problems/concerns.

## 2016-06-16 NOTE — Progress Notes (Signed)
Please see the Nurse Progress Note in the MD Initial Consult Encounter for this patient. 

## 2016-06-16 NOTE — Progress Notes (Signed)
Radiation Oncology         [phone] ________________________________  Name: Tyler Aguilar MRN: [id number]  Date: 06/16/2016  DOB: [birth year]  VH:QIONGEX,BMWUXL L, MD  Whitney Muse, Kelby Fam, MD     REFERRING PHYSICIAN: Patrici Ranks, MD   DIAGNOSIS: The encounter diagnosis was Bone metastasis ([place]).  HISTORY OF PRESENT ILLNESS:Tyler Aguilar is a 60 y.o. male who is seen for an initial consultation visit for metastatic lung cancer. He had a stroke and was admitted at [facility] but then was airlifted to [facility] to treat him. He mentions that he was coughing up blood and that's when he had a CT angio of the chest, revealing a lung mass. He was squamous cell carcinoma of the lung 1/17/2017He had radiation therapy at [facility] starting 01/11/2016. He started chemotherapy, Carboplatin/Abraxane, 03/07/2016 and has stopped. He is currently receiving Keytruda and had treatment 06/13/2016. He had pain in his left femur that started a month or two ago and had a bone scan ordered 05/29/2016, revealing a focal photopenic abnormality in the left iliac wing. He had an x-ray of the pelvis the next day, showing the large lytic lesions in the left iliac wing and proximal left femur are consistent with metastatic disease. Further review of the chest CT from 04/29/2016 was performed and this showed there is a slightly expansile lytic osseous metastasis in the right T4 pedicle/pars interarticularis, new since 03/12/2016. He had a PET scan 06/10/2016, revealing hypermetabolic change in bilateral lung nodules and metabolic activity within T4, concerning for neuraforaminal lesion on the right and a large left right iliac wing lesion. There was also an L5 perispinal lesion and destructive lesion of the left femur. He is here today to discuss radiation treatment to the proximal femur and right pedicle T4 with Dr. Lisbeth Renshaw.   PREVIOUS RADIATION THERAPY: Yes, he had palliative radiation to his central lung at [facility]  01/11/2016- 02/19/16 initially with 2 fractions of 3 Gy each, and then conventional radiotherapy to a total dose of 60 Gy in 29 fractions .  PAST MEDICAL HISTORY:  has a past medical history of Stroke [facility]); COPD (chronic obstructive pulmonary disease) ([place]); AAA (abdominal aortic aneurysm) ([place]); Dyslipidemia; Lung cancer ([place]); Cardiomyopathy ([place]); and PAF (paroxysmal atrial fibrillation) ([place]).     PAST SURGICAL HISTORY: Past Surgical History  Procedure Laterality Date  . Finger surgery Right 1979  . Colonoscopy  08/31/2012    Procedure: COLONOSCOPY;  Surgeon: Jamesetta So, MD;  Location: [facility] ENDO SUITE;  Service: Gastroenterology;  Laterality: N/A;  . Bronchoscopy    . Portacath placement Left 02/27/2016    Procedure: INSERTION PORT-A-CATH LEFT SUBCLAVIAN;  Surgeon: Aviva Signs, MD;  Location: [facility] ORS;  Service: General;  Laterality: Left;     FAMILY HISTORY:  Family History  Problem Relation Age of Onset  . Diabetes Mellitus II Mother   . Cancer Mother   . Cancer Father   . Coronary artery disease Brother     CABG     SOCIAL HISTORY:  reports that he quit smoking about 5 months ago. His smoking use included Cigarettes. He does not have any smokeless tobacco history on file. He reports that he drinks alcohol. He reports that he does not use illicit drugs. 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   MEDICATIONS:  Current Outpatient Prescriptions  Medication Sig Dispense Refill  . aspirin 81 MG tablet  Take 81 mg by mouth daily.    Marland Kitchen atorvastatin (LIPITOR) 80 MG tablet Take 80 mg by mouth every evening.    . budesonide-formoterol (SYMBICORT) 160-4.5 MCG/ACT inhaler Inhale 2 puffs into the lungs 2 (two) times daily.    Marland Kitchen CARBOPLATIN IV Inject into the vein. To start 3/24    . carvedilol (COREG) 3.125 MG tablet Take 1 tablet (3.125 mg total) by mouth 2 (two) times daily. 180 tablet 3  .  cholecalciferol (VITAMIN D) 1000 units tablet Take 1,000 Units by mouth daily.    Marland Kitchen escitalopram (LEXAPRO) 10 MG tablet Take 1 tablet (10 mg total) by mouth daily. 90 tablet 1  . fentaNYL (DURAGESIC - DOSED MCG/HR) 12 MCG/HR Place 1 patch (12.5 mcg total) onto the skin every 3 (three) days. 10 patch 0  . fexofenadine (ALLEGRA ALLERGY) 180 MG tablet Take 1 tablet (180 mg total) by mouth daily. (Patient not taking: Reported on 06/13/2016) 30 tablet 3  . lidocaine-prilocaine (EMLA) cream Apply a quarter size amount to port site 1 hour prior to chemo. Do not rub in. Cover with plastic wrap. 30 g 3  . LORazepam (ATIVAN) 1 MG tablet Take 1 tablet (1 mg total) by mouth 3 (three) times daily as needed for anxiety. 60 tablet 0  . losartan (COZAAR) 25 MG tablet Take 0.5 tablets (12.5 mg total) by mouth daily. 45 tablet 3  . Omega-3 Fatty Acids (FISH OIL) 1000 MG CAPS Take by mouth. Pt and sister unsure of dosage    . oxyCODONE (ROXICODONE) 5 MG immediate release tablet Take 1 tablet (5 mg total) by mouth every 6 (six) hours as needed for severe pain. 60 tablet 0  . PACLitaxel Protein-Bound Part (ABRAXANE IV) Inject into the vein. To start 3/24.    Marland Kitchen pantoprazole (PROTONIX) 40 MG tablet Take 1 tablet (40 mg total) by mouth daily. 90 tablet 1  . predniSONE (DELTASONE) 20 MG tablet At onset of symptoms take 3 tablets daily 21 tablet 4  . pyridOXINE (VITAMIN B-6) 50 MG tablet Take 50 mg by mouth daily.    . ranitidine (ZANTAC) 150 MG tablet Take 1 tablet (150 mg total) by mouth 2 (two) times daily. 180 tablet 1  . rivaroxaban (XARELTO) 20 MG TABS tablet Take 1 tablet (20 mg total) by mouth daily. 90 tablet 2  . Spacer/Aero-Holding Chambers (AEROCHAMBER PLUS FLO-VU) MISC     . SPIRIVA RESPIMAT 2.5 MCG/ACT AERS INHALE 2 PUFFS INTO LUNGS DAILY 1 Inhaler 3   No current facility-administered medications for this encounter.     REVIEW OF SYSTEMS: On review of systems, the patient reports that he is doing well  overall. He denies any chest pain, cough, fevers, chills, night sweats, unintended weight changes. He denies any bowel or bladder disturbances, and denies abdominal pain, nausea or vomiting. He denies any new musculoskeletal or joint aches or pains. He mentions he has had some pain in his mid-back that is very noticeable when he stretches or reaches to grab something. He denies numbness or tingling in his legs. He denies bowel or bladder incontinence. He mentions he has some soreness in his hip that he treats with Oxycodone and Tylenol. He was given a Fentanyl patch that he hasn't used yet. He mentions he has a pulling sensation when he reaches for something in his T4 region. A complete review of systems is obtained and is otherwise negative.  PHYSICAL EXAM:  height is 5' 5"  (1.651 m) and weight is 140 lb 6.4  oz (63.685 kg). His oral temperature is 97.9 F (36.6 C). His blood pressure is 110/67 and his pulse is 96. His respiration is 18 and oxygen saturation is 96%.    ECOG = 1  0 - Asymptomatic (Fully active, able to carry on all predisease activities without restriction)  1 - Symptomatic but completely ambulatory (Restricted in physically strenuous activity but ambulatory and able to carry out work of a light or sedentary nature. For example, light housework, office work)  2 - Symptomatic, <50% in bed during the day (Ambulatory and capable of all self care but unable to carry out any work activities. Up and about more than 50% of waking hours)  3 - Symptomatic, >50% in bed, but not bedbound (Capable of only limited self-care, confined to bed or chair 50% or more of waking hours)  4 - Bedbound (Completely disabled. Cannot carry on any self-care. Totally confined to bed or chair)  5 - Death   Eustace Pen MM, Creech RH, Tormey DC, et al. [phone]). "Toxicity and response criteria of the [facility]". [place] Oncol. 5 (6): 649-55     LABORATORY DATA:  Lab Results  Component  Value Date   WBC 4.1 06/13/2016   HGB 8.7* 06/13/2016   HCT 26.5* 06/13/2016   MCV 102.7* 06/13/2016   PLT 241 06/13/2016   Lab Results  Component Value Date   NA 135 06/13/2016   K 3.8 06/13/2016   CL 100* 06/13/2016   CO2 27 06/13/2016   Lab Results  Component Value Date   ALT 22 06/13/2016   AST 23 06/13/2016   ALKPHOS 66 06/13/2016   BILITOT 0.4 06/13/2016      RADIOGRAPHY: Dg Pelvis 1-2 Views  05/30/2016  CLINICAL DATA:  Hip pain.  Abnormal bone scan.  Lung cancer. EXAM: PELVIS - 1-2 VIEW COMPARISON:  Bone scan 05/29/2016.  CT 02/19/2010. FINDINGS: Large lytic lesion noted in the left iliac wing and proximal left femur consistent with metastatic disease. No other focal abnormalities identified. Aortoiliac atherosclerotic vascular disease. IMPRESSION: Large lytic lesions in the left iliac wing and proximal left femur consistent with metastatic disease. Electronically Signed   By: Marcello Moores  Register   On: 05/30/2016 14:46   Nm Bone Scan Whole Body  05/29/2016  CLINICAL DATA:  Tc MDP 22.9 mCi. Patient notes Left hip/leg pain since beginning chemo in the past months. Diagnosed with cancer in Jan 2017.^25MILLI CURIE TC-MDP TECHNETIUM TC 44M MEDRONATE IV KIT. Squamous cell carcinoma of the lung. EXAM: NUCLEAR MEDICINE WHOLE BODY BONE SCAN TECHNIQUE: Whole body anterior and posterior images were obtained approximately 3 hours after intravenous injection of radiopharmaceutical. RADIOPHARMACEUTICALS:  22.9 mCi Technetium-49mMDP IV COMPARISON:  Chest CT 04/29/2016 FINDINGS: Bilateral renal activity is present. Focal round photopenic defect is identified overlying the left iliac wing. Mildly asymmetric activity in the ankles is likely due to positioning. No other asymmetric areas of activity are identified to suggest osseous metastatic disease. IMPRESSION: 1. Focal photopenic abnormality in the left iliac wing. 2. Suspect lytic bone lesion or less likely, artifact. Does the patient have an  implanted metallic device? 3. Plain film of the pelvis is recommended for further evaluation. These results will be called to the ordering clinician or representative by the Radiologist Assistant, and communication documented in the PACS or zVision Dashboard. Electronically Signed   By: ENolon NationsM.D.   On: 05/29/2016 13:01   Nm Pet Image Initial (pi) Skull Base To Thigh  06/10/2016  CLINICAL  DATA:  Initial treatment strategy for lung cancer. EXAM: NUCLEAR MEDICINE PET SKULL BASE TO THIGH TECHNIQUE: 8.1 mCi F-18 FDG was injected intravenously. Full-ring PET imaging was performed from the skull base to thigh after the radiotracer. CT data was obtained and used for attenuation correction and anatomic localization. FASTING BLOOD GLUCOSE:  Value: 112 mg/dl COMPARISON:  04/29/2016 FINDINGS: NECK No hypermetabolic lymph nodes in the neck. CHEST Normal heart size. Aortic atherosclerosis is identified. Calcifications involving the RCA, LAD and left circumflex coronary artery. Sub- carinal mass measures 2.7 cm and has an SUV max equal to 18.07. No pleural effusion identified. Part solid lesion within the right upper lobe measures 2.7 cm and has an SUV max equal to 6.2. The part solid lesion involving the superior segment of the right lower lobe measures 1.7 cm and has an SUV max equal to 6.14, image 41 of series 6. Faint ground-glass nodule within the left upper lobe measures 7 mm and has an SUV max equal to 1.42, image 30 of series 6. ABDOMEN/PELVIS No abnormal hypermetabolic activity within the liver, pancreas, adrenal glands, or spleen. Infrarenal abdominal aortic aneurysm has a maximum AP diameter of 4.1 cm. No hypermetabolic lymph nodes in the abdomen or pelvis. SKELETON Multifocal hypermetabolic bone metastases are identified. Expansile and lytic lesion involving the posterior elements of the T4 vertebra measures approximately 3.8 cm and has an SUV max equal to 27.7. This appears to involve the L4-5  neuroforamina on the right, image 59 of series 4. Cannot rule out canal involvement at this level. Large destructive lesion involving the left iliac wing measures 7.1 cm and has an SUV max equal to 23.08. Paraspinal intramuscular lesion is identified posterior to the L5 vertebra. This measures approximately 2.2 cm and has an SUV max equal to 20.32. Finally, there is a destructive lesion involving the posterior cortex of the proximal left femur measuring 2.8 cm. SUV max equals 222.2. IMPRESSION: 1. Multifocal sub solid lesions involving the lungs are intensely hypermetabolic and compatible with primary bronchogenic carcinoma 2. Hypermetabolic sub- carinal lymph node compatible with metastatic adenopathy. 3. Multifocal lytic bone metastases are identified. Lesion involving the posterior elements of the T4 vertebra may be of neurologic significance. The lesion involving the posterior cortex of the left femur may be of orthopedic significance. 4. Aortic atherosclerosis and multi vessel coronary artery calcification. 5. Infrarenal abdominal aortic aneurysm is noted measuring 4.1 cm. Recommend followup by [place] in 1 year. This recommendation follows ACR consensus guidelines: White Paper of the ACR Incidental Findings Committee II on Vascular Findings. J Am Coll Radiol 2013; 10:789-794. Electronically Signed   By: Kerby Moors M.D.   On: 06/10/2016 14:43       IMPRESSION: Tyler Aguilar is a 59 yo male with non-small cell lung cancer, stage III, mets to proximal femur and right pedicle T4.    PLAN: We discussed the role of radiation and its side effects. We discussed that we would need to obtain the details from his previous treatments through [facility] to make sure that his fields would not overlap if we proceeded with palliative radiotherapy in 30 Gy over 10 fractions. Dr. Lisbeth Renshaw discusses the risks, benefits, short, and long term effects of therapy if we were to proceed in this manner. The patient is in agreement. We will  plan to simulate today and obtain his records. We will move forward accordingly.   The above documentation reflects my direct findings during this shared patient visit. Please see the separate note by  Dr. Lisbeth Renshaw on this date for the remainder of the patient's plan of care.    Carola Rhine, PAC   Addendum: After receiving the patient's records from [facility], we have determined that the fields we would prescribe would overlap in his previously treated field. Hence, Dr. Lisbeth Renshaw has recommended proceeding with palliative radiotherapy to the left iliac bone and femur, and SRS to the T4 lesion. I will inform our navigator once I have informed the patient of this plan.   Carola Rhine, PAC

## 2016-06-17 DIAGNOSIS — Z8673 Personal history of transient ischemic attack (TIA), and cerebral infarction without residual deficits: Secondary | ICD-10-CM | POA: Diagnosis not present

## 2016-06-18 ENCOUNTER — Telehealth: Payer: Self-pay | Admitting: Radiation Oncology

## 2016-06-18 ENCOUNTER — Other Ambulatory Visit: Payer: Self-pay | Admitting: Radiation Therapy

## 2016-06-18 DIAGNOSIS — Z8673 Personal history of transient ischemic attack (TIA), and cerebral infarction without residual deficits: Secondary | ICD-10-CM | POA: Diagnosis not present

## 2016-06-18 DIAGNOSIS — C7949 Secondary malignant neoplasm of other parts of nervous system: Principal | ICD-10-CM

## 2016-06-18 DIAGNOSIS — C7931 Secondary malignant neoplasm of brain: Secondary | ICD-10-CM

## 2016-06-18 NOTE — Telephone Encounter (Signed)
I called and spoke with the patient's sister to let  the patient know that Dr. Lisbeth Renshaw has reviewed his previous treatment strategy and that we would move forward with SRS to the T4 spine. We discussed the options for treatment and [facility] [place] will be in touch.

## 2016-06-23 ENCOUNTER — Ambulatory Visit
Admission: RE | Admit: 2016-06-23 | Discharge: 2016-06-23 | Disposition: A | Discharge status: Home / Self Care | Payer: Medicare PPO | Source: Ambulatory Visit | Attending: Radiation Oncology | Admitting: Radiation Oncology

## 2016-06-23 ENCOUNTER — Other Ambulatory Visit (HOSPITAL_COMMUNITY): Payer: Self-pay | Admitting: *Deleted

## 2016-06-23 DIAGNOSIS — Z8673 Personal history of transient ischemic attack (TIA), and cerebral infarction without residual deficits: Secondary | ICD-10-CM | POA: Diagnosis not present

## 2016-06-23 DIAGNOSIS — C3491 Malignant neoplasm of unspecified part of right bronchus or lung: Secondary | ICD-10-CM

## 2016-06-23 MED ORDER — LEVOFLOXACIN 500 MG PO TABS: 500.0000 mg | ORAL_TABLET | Freq: Every day | ORAL | Status: DC

## 2016-06-24 ENCOUNTER — Ambulatory Visit
Admission: RE | Admit: 2016-06-24 | Discharge: 2016-06-24 | Disposition: A | Discharge status: Home / Self Care | Payer: Medicare PPO | Source: Ambulatory Visit | Attending: Radiation Oncology | Admitting: Radiation Oncology

## 2016-06-24 ENCOUNTER — Ambulatory Visit: Payer: Medicare PPO | Admitting: Radiation Oncology

## 2016-06-24 DIAGNOSIS — Z8673 Personal history of transient ischemic attack (TIA), and cerebral infarction without residual deficits: Secondary | ICD-10-CM | POA: Diagnosis not present

## 2016-06-24 DIAGNOSIS — C3490 Malignant neoplasm of unspecified part of unspecified bronchus or lung: Secondary | ICD-10-CM

## 2016-06-24 MED ORDER — SONAFINE EX EMUL
1.0000 | Freq: Two times a day (BID) | CUTANEOUS | Status: DC
Start: 2016-06-24 — End: 2016-06-25
  Administered 2016-06-24: 1 via TOPICAL

## 2016-06-24 NOTE — Progress Notes (Signed)
Pt education done, left femur, pelvis and thoracic area to have radiation, radiation the repay and you book, sonafine cream, my business card, discussed ways to manage side effects of nausea, vomiting, diarrhea, , loss appetite, irritation of esophagus, fatigue, pain,  increase protein in diet, buy imodium for diarrhea, do not eat fresh fruit or fresh vegetables if having diarreha, may need to stay away from fried greay, spicy foods, may need to eat 5-6 smaller meals and snack in between, high calorie drinks, water, sees Md weekly and prn, verbal understanding, teach back given

## 2016-06-25 ENCOUNTER — Ambulatory Visit
Admission: RE | Admit: 2016-06-25 | Discharge: 2016-06-25 | Disposition: A | Discharge status: Home / Self Care | Payer: Medicare PPO | Source: Ambulatory Visit | Attending: Radiation Oncology | Admitting: Radiation Oncology

## 2016-06-25 DIAGNOSIS — Z8673 Personal history of transient ischemic attack (TIA), and cerebral infarction without residual deficits: Secondary | ICD-10-CM | POA: Diagnosis not present

## 2016-06-26 ENCOUNTER — Ambulatory Visit
Admission: RE | Admit: 2016-06-26 | Discharge: 2016-06-26 | Disposition: A | Discharge status: Home / Self Care | Payer: Medicare PPO | Source: Ambulatory Visit | Attending: Radiation Oncology | Admitting: Radiation Oncology

## 2016-06-26 DIAGNOSIS — C7931 Secondary malignant neoplasm of brain: Secondary | ICD-10-CM

## 2016-06-26 DIAGNOSIS — C7949 Secondary malignant neoplasm of other parts of nervous system: Principal | ICD-10-CM

## 2016-06-26 DIAGNOSIS — Z8673 Personal history of transient ischemic attack (TIA), and cerebral infarction without residual deficits: Secondary | ICD-10-CM | POA: Diagnosis not present

## 2016-06-26 MED ORDER — GADOBENATE DIMEGLUMINE 529 MG/ML IV SOLN
13.0000 mL | Freq: Once | INTRAVENOUS | Status: AC | PRN
  Administered 2016-06-26: 13 mL via INTRAVENOUS

## 2016-06-27 ENCOUNTER — Encounter: Payer: Self-pay | Admitting: Radiation Oncology

## 2016-06-27 ENCOUNTER — Ambulatory Visit
Admission: RE | Admit: 2016-06-27 | Discharge: 2016-06-27 | Disposition: A | Discharge status: Home / Self Care | Payer: Medicare PPO | Source: Ambulatory Visit | Attending: Radiation Oncology | Admitting: Radiation Oncology

## 2016-06-27 VITALS — BP 110/68 | HR 84 | Temp 99.1°F | Resp 16 | Wt 137.3 lb

## 2016-06-27 DIAGNOSIS — Z8673 Personal history of transient ischemic attack (TIA), and cerebral infarction without residual deficits: Secondary | ICD-10-CM | POA: Diagnosis not present

## 2016-06-27 DIAGNOSIS — C7951 Secondary malignant neoplasm of bone: Secondary | ICD-10-CM

## 2016-06-27 NOTE — Progress Notes (Signed)
Weekly rad txs left femur ,left iliac, no c/o pain or slight  Nausea,  Tired,   slight fever 99.1,started levaquin Tuesday, , no appetite, on fentanyl patch, takes ativan,  BP 110/68 mmHg  Pulse 84  Temp(Src) 99.1 F (37.3 C)  Resp 16  Wt 137 lb 4.8 oz (62.279 kg)  Wt Readings from Last 3 Encounters:  06/27/16 137 lb 4.8 oz (62.279 kg)  06/16/16 140 lb 6.4 oz (63.685 kg)  06/13/16 140 lb 1.6 oz (63.549 kg)

## 2016-06-28 DIAGNOSIS — C7951 Secondary malignant neoplasm of bone: Secondary | ICD-10-CM | POA: Insufficient documentation

## 2016-06-28 NOTE — Progress Notes (Signed)
Department of Radiation Oncology  Phone:  [phone] Fax:        [phone]  Weekly Treatment Note    Name: Tyler Aguilar Date: 06/28/2016 MRN: [id number] DOB: 07-24-1956   Diagnosis:     ICD-9-CM ICD-10-CM   1. Bone metastasis (H[facility]) 198.5 C79.51      Current dose: 15 Gy  Current fraction: 5   MEDICATIONS: Current Outpatient Prescriptions  Medication Sig Dispense Refill  . aspirin 81 MG tablet Take 81 mg by mouth daily.    Marland Kitchen atorvastatin (LIPITOR) 80 MG tablet Take 80 mg by mouth every evening.    . budesonide-formoterol (SYMBICORT) 160-4.5 MCG/ACT inhaler Inhale 2 puffs into the lungs 2 (two) times daily.    Marland Kitchen CARBOPLATIN IV Inject into the vein. Reported on 06/16/2016    . carvedilol (COREG) 3.125 MG tablet Take 1 tablet (3.125 mg total) by mouth 2 (two) times daily. 180 tablet 3  . cholecalciferol (VITAMIN D) 1000 units tablet Take 1,000 Units by mouth daily.    Marland Kitchen escitalopram (LEXAPRO) 10 MG tablet Take 1 tablet (10 mg total) by mouth daily. 90 tablet 1  . fentaNYL (DURAGESIC - DOSED MCG/HR) 12 MCG/HR Place 1 patch (12.5 mcg total) onto the skin every 3 (three) days. 10 patch 0  . fexofenadine (ALLEGRA ALLERGY) 180 MG tablet Take 1 tablet (180 mg total) by mouth daily. 30 tablet 3  . levofloxacin (LEVAQUIN) 500 MG tablet Take 1 tablet (500 mg total) by mouth daily. 10 tablet 0  . lidocaine-prilocaine (EMLA) cream Apply a quarter size amount to port site 1 hour prior to chemo. Do not rub in. Cover with plastic wrap. 30 g 3  . LORazepam (ATIVAN) 1 MG tablet Take 1 tablet (1 mg total) by mouth 3 (three) times daily as needed for anxiety. 60 tablet 0  . losartan (COZAAR) 25 MG tablet Take 0.5 tablets (12.5 mg total) by mouth daily. 45 tablet 3  . Omega-3 Fatty Acids (FISH OIL) 1000 MG CAPS Take by mouth. Pt and sister unsure of dosage    . oxyCODONE (ROXICODONE) 5 MG immediate release tablet Take 1 tablet (5 mg total) by mouth every 6 (six) hours as needed for severe  pain. 60 tablet 0  . PACLitaxel Protein-Bound Part (ABRAXANE IV) Inject into the vein. Reported on 06/16/2016    . pantoprazole (PROTONIX) 40 MG tablet Take 1 tablet (40 mg total) by mouth daily. 90 tablet 1  . pyridOXINE (VITAMIN B-6) 50 MG tablet Take 50 mg by mouth daily.    . ranitidine (ZANTAC) 150 MG tablet Take 1 tablet (150 mg total) by mouth 2 (two) times daily. 180 tablet 1  . rivaroxaban (XARELTO) 20 MG TABS tablet Take 1 tablet (20 mg total) by mouth daily. 90 tablet 2  . Spacer/Aero-Holding Chambers (AEROCHAMBER PLUS FLO-VU) MISC     . SPIRIVA RESPIMAT 2.5 MCG/ACT AERS INHALE 2 PUFFS INTO LUNGS DAILY 1 Inhaler 3  . predniSONE (DELTASONE) 20 MG tablet At onset of symptoms take 3 tablets daily (Patient not taking: Reported on 06/16/2016) 21 tablet 4   No current facility-administered medications for this encounter.     ALLERGIES: Review of patient's allergies indicates no known allergies.   LABORATORY DATA:  Lab Results  Component Value Date   WBC 4.1 06/13/2016   HGB 8.7* 06/13/2016   HCT 26.5* 06/13/2016   MCV 102.7* 06/13/2016   PLT 241 06/13/2016   Lab Results  Component Value Date   NA 135 06/13/2016  K 3.8 06/13/2016   CL 100* 06/13/2016   CO2 27 06/13/2016   Lab Results  Component Value Date   ALT 22 06/13/2016   AST 23 06/13/2016   ALKPHOS 66 06/13/2016   BILITOT 0.4 06/13/2016     NARRATIVE: Tyler Aguilar was seen today for weekly treatment management. The chart was checked and the patient's films were reviewed.  Weekly rad txs left femur ,left iliac, no c/o pain or slight  Nausea,  Tired,   slight fever 99.1,started levaquin Tuesday, , no appetite, on fentanyl patch, takes ativan,  BP 110/68 mmHg  Pulse 84  Temp(Src) 99.1 F (37.3 C)  Resp 16  Wt 137 lb 4.8 oz (62.279 kg)  Wt Readings from Last 3 Encounters:  06/27/16 137 lb 4.8 oz (62.279 kg)  06/16/16 140 lb 6.4 oz (63.685 kg)  06/13/16 140 lb 1.6 oz (63.549 kg)    PHYSICAL EXAMINATION:  weight is 137 lb 4.8 oz (62.279 kg). His temperature is 99.1 F (37.3 C). His blood pressure is 110/68 and his pulse is 84. His respiration is 16.        ASSESSMENT: The patient is doing satisfactorily with treatment.  PLAN: We will continue with the patient's radiation treatment as planned.

## 2016-06-30 ENCOUNTER — Other Ambulatory Visit: Payer: Self-pay | Admitting: Radiation Therapy

## 2016-06-30 ENCOUNTER — Ambulatory Visit
Admission: RE | Admit: 2016-06-30 | Discharge: 2016-06-30 | Disposition: A | Discharge status: Home / Self Care | Payer: Medicare PPO | Source: Ambulatory Visit | Attending: Radiation Oncology | Admitting: Radiation Oncology

## 2016-06-30 DIAGNOSIS — Z8673 Personal history of transient ischemic attack (TIA), and cerebral infarction without residual deficits: Secondary | ICD-10-CM | POA: Diagnosis not present

## 2016-06-30 DIAGNOSIS — C7949 Secondary malignant neoplasm of other parts of nervous system: Principal | ICD-10-CM

## 2016-06-30 DIAGNOSIS — C7931 Secondary malignant neoplasm of brain: Secondary | ICD-10-CM

## 2016-07-01 ENCOUNTER — Ambulatory Visit
Admission: RE | Admit: 2016-07-01 | Discharge: 2016-07-01 | Disposition: A | Discharge status: Home / Self Care | Payer: Medicare PPO | Source: Ambulatory Visit | Attending: Radiation Oncology | Admitting: Radiation Oncology

## 2016-07-01 ENCOUNTER — Telehealth (HOSPITAL_COMMUNITY): Payer: Self-pay

## 2016-07-01 ENCOUNTER — Encounter (HOSPITAL_COMMUNITY): Payer: Self-pay | Admitting: Oncology

## 2016-07-01 DIAGNOSIS — Z8673 Personal history of transient ischemic attack (TIA), and cerebral infarction without residual deficits: Secondary | ICD-10-CM | POA: Diagnosis not present

## 2016-07-01 NOTE — Telephone Encounter (Signed)
Called patient and spoke with sister, Santiago Glad. Instructed her that patient needed to hold hi Xarelto tonight prior to his myelogram tomorrow. He can start back on the Xarelto on 7/20. Sister verbalized understanding and stated she had been instructed to hold his lexapro also.

## 2016-07-02 ENCOUNTER — Ambulatory Visit
Admission: RE | Admit: 2016-07-02 | Discharge: 2016-07-02 | Disposition: A | Discharge status: Home / Self Care | Payer: Medicare PPO | Source: Ambulatory Visit | Attending: Radiation Oncology | Admitting: Radiation Oncology

## 2016-07-02 ENCOUNTER — Ambulatory Visit: Payer: Medicare PPO | Admitting: Radiation Oncology

## 2016-07-02 DIAGNOSIS — C7931 Secondary malignant neoplasm of brain: Secondary | ICD-10-CM

## 2016-07-02 DIAGNOSIS — C7949 Secondary malignant neoplasm of other parts of nervous system: Principal | ICD-10-CM

## 2016-07-02 DIAGNOSIS — Z8673 Personal history of transient ischemic attack (TIA), and cerebral infarction without residual deficits: Secondary | ICD-10-CM | POA: Diagnosis not present

## 2016-07-02 MED ORDER — IOPAMIDOL (ISOVUE-M 300) INJECTION 61%
10.0000 mL | Freq: Once | INTRAMUSCULAR | Status: AC | PRN
  Administered 2016-07-02: 10 mL via INTRATHECAL

## 2016-07-02 NOTE — Progress Notes (Signed)
Patient states he has been off Lexapro and Xarelto for at least the past two days.

## 2016-07-02 NOTE — Discharge Instructions (Signed)
Myelogram Discharge Instructions  1. Go home and rest quietly for the next 24 hours.  It is important to lie flat for the next 24 hours.  Get up only to go to the restroom.  You may lie in the bed or on a couch on your back, your stomach, your left side or your right side.  You may have one pillow under your head.  You may have pillows between your knees while you are on your side or under your knees while you are on your back.  2. DO NOT drive today.  Recline the seat as far back as it will go, while still wearing your seat belt, on the way home.  3. You may get up to go to the bathroom as needed.  You may sit up for 10 minutes to eat.  You may resume your normal diet and medications unless otherwise indicated.  Drink lots of extra fluids today and tomorrow.  4. The incidence of headache, nausea, or vomiting is about 5% (one in 20 patients).  If you develop a headache, lie flat and drink plenty of fluids until the headache goes away.  Caffeinated beverages may be helpful.  If you develop severe nausea and vomiting or a headache that does not go away with flat bed rest, call [phone].  5. You may resume normal activities after your 24 hours of bed rest is over; however, do not exert yourself strongly or do any heavy lifting tomorrow. If when you get up you have a headache when standing, go back to bed and force fluids for another 24 hours.  6. Call your physician for a follow-up appointment.  The results of your myelogram will be sent directly to your physician by the following day.  7. If you have any questions or if complications develop after you arrive home, please call [phone].  Discharge instructions have been explained to the patient.  The patient, or the person responsible for the patient, fully understands these instructions.        MAY RESUME [place].   May resume Lexapro on July 03, 2016, after 9:30 am.

## 2016-07-03 ENCOUNTER — Telehealth: Payer: Self-pay

## 2016-07-03 ENCOUNTER — Ambulatory Visit
Admission: RE | Admit: 2016-07-03 | Discharge: 2016-07-03 | Disposition: A | Discharge status: Home / Self Care | Payer: Medicare PPO | Source: Ambulatory Visit | Attending: Radiation Oncology | Admitting: Radiation Oncology

## 2016-07-03 DIAGNOSIS — Z8673 Personal history of transient ischemic attack (TIA), and cerebral infarction without residual deficits: Secondary | ICD-10-CM | POA: Diagnosis not present

## 2016-07-03 NOTE — Telephone Encounter (Signed)
Benefit letter received,benefit verification is pending for xarelto,per Wynetta Emery and [employer] rep, we should know by early next week.may call on 07/08/16 at [phone] to see if pt gets free Xarelto

## 2016-07-04 ENCOUNTER — Ambulatory Visit
Admission: RE | Admit: 2016-07-04 | Discharge: 2016-07-04 | Disposition: A | Discharge status: Home / Self Care | Payer: Medicare PPO | Source: Ambulatory Visit | Attending: Radiation Oncology | Admitting: Radiation Oncology

## 2016-07-04 ENCOUNTER — Encounter: Payer: Self-pay | Admitting: Radiation Oncology

## 2016-07-04 ENCOUNTER — Ambulatory Visit: Payer: Medicare PPO

## 2016-07-04 ENCOUNTER — Other Ambulatory Visit: Payer: Self-pay

## 2016-07-04 ENCOUNTER — Encounter (HOSPITAL_COMMUNITY): Payer: Medicare PPO | Attending: Hematology & Oncology

## 2016-07-04 ENCOUNTER — Encounter (HOSPITAL_COMMUNITY): Payer: Self-pay | Admitting: Oncology

## 2016-07-04 ENCOUNTER — Encounter (HOSPITAL_BASED_OUTPATIENT_CLINIC_OR_DEPARTMENT_OTHER): Payer: Medicare PPO | Admitting: Oncology

## 2016-07-04 VITALS — BP 109/69 | HR 90 | Temp 98.2°F | Resp 16 | Wt 131.4 lb

## 2016-07-04 VITALS — BP 103/63 | HR 87 | Temp 98.1°F | Resp 16

## 2016-07-04 DIAGNOSIS — C3490 Malignant neoplasm of unspecified part of unspecified bronchus or lung: Secondary | ICD-10-CM

## 2016-07-04 DIAGNOSIS — C7951 Secondary malignant neoplasm of bone: Secondary | ICD-10-CM | POA: Diagnosis not present

## 2016-07-04 DIAGNOSIS — D6481 Anemia due to antineoplastic chemotherapy: Secondary | ICD-10-CM | POA: Diagnosis not present

## 2016-07-04 DIAGNOSIS — Z8673 Personal history of transient ischemic attack (TIA), and cerebral infarction without residual deficits: Secondary | ICD-10-CM | POA: Diagnosis not present

## 2016-07-04 DIAGNOSIS — C3491 Malignant neoplasm of unspecified part of right bronchus or lung: Secondary | ICD-10-CM | POA: Diagnosis not present

## 2016-07-04 DIAGNOSIS — D630 Anemia in neoplastic disease: Secondary | ICD-10-CM

## 2016-07-04 DIAGNOSIS — R05 Cough: Secondary | ICD-10-CM | POA: Insufficient documentation

## 2016-07-04 DIAGNOSIS — R938 Abnormal findings on diagnostic imaging of other specified body structures: Secondary | ICD-10-CM | POA: Diagnosis present

## 2016-07-04 DIAGNOSIS — R0789 Other chest pain: Secondary | ICD-10-CM

## 2016-07-04 DIAGNOSIS — R042 Hemoptysis: Secondary | ICD-10-CM | POA: Diagnosis present

## 2016-07-04 DIAGNOSIS — Z5112 Encounter for antineoplastic immunotherapy: Secondary | ICD-10-CM | POA: Diagnosis not present

## 2016-07-04 DIAGNOSIS — R079 Chest pain, unspecified: Secondary | ICD-10-CM

## 2016-07-04 LAB — COMPREHENSIVE METABOLIC PANEL
ALBUMIN: 2.7 g/dL — AB (ref 3.5–5.0)
ALK PHOS: 69 U/L (ref 38–126)
ALT: 25 U/L (ref 17–63)
ANION GAP: 6 (ref 5–15)
AST: 25 U/L (ref 15–41)
BILIRUBIN TOTAL: 0.5 mg/dL (ref 0.3–1.2)
BUN: 10 mg/dL (ref 6–20)
CALCIUM: 9 mg/dL (ref 8.9–10.3)
CO2: 27 mmol/L (ref 22–32)
Chloride: 100 mmol/L — ABNORMAL LOW (ref 101–111)
Creatinine, Ser: 0.59 mg/dL — ABNORMAL LOW (ref 0.61–1.24)
GFR calc Af Amer: >60 mL/min (ref >60)
GLUCOSE: 101 mg/dL — AB (ref 65–99)
POTASSIUM: 3.7 mmol/L (ref 3.5–5.1)
Sodium: 133 mmol/L — ABNORMAL LOW (ref 135–145)
TOTAL PROTEIN: 6.7 g/dL (ref 6.5–8.1)

## 2016-07-04 LAB — CBC WITH DIFFERENTIAL/PLATELET
BASOS PCT: 0 %
Basophils Absolute: 0 10*3/uL (ref 0.0–0.1)
Eosinophils Absolute: 0.1 10*3/uL (ref 0.0–0.7)
Eosinophils Relative: 2 %
HEMATOCRIT: 25.4 % — AB (ref 39.0–52.0)
HEMOGLOBIN: 8.2 g/dL — AB (ref 13.0–17.0)
LYMPHS ABS: 0.3 10*3/uL — AB (ref 0.7–4.0)
LYMPHS PCT: 5 %
MCH: 32.8 pg (ref 26.0–34.0)
MCHC: 32.3 g/dL (ref 30.0–36.0)
MCV: 101.6 fL — AB (ref 78.0–100.0)
Monocytes Absolute: 0.6 10*3/uL (ref 0.1–1.0)
Monocytes Relative: 12 %
NEUTROS PCT: 81 %
Neutro Abs: 4.3 10*3/uL (ref 1.7–7.7)
Platelets: 198 10*3/uL (ref 150–400)
RBC: 2.5 MIL/uL — ABNORMAL LOW (ref 4.22–5.81)
RDW: 17.3 % — AB (ref 11.5–15.5)
WBC: 5.3 10*3/uL (ref 4.0–10.5)

## 2016-07-04 LAB — TSH: TSH: 0.64 u[IU]/mL (ref 0.350–4.500)

## 2016-07-04 MED ORDER — SODIUM CHLORIDE 0.9% FLUSH: 10.0000 mL | INTRAVENOUS | Status: DC | PRN

## 2016-07-04 MED ORDER — SODIUM CHLORIDE 0.9 % IV SOLN
Freq: Once | INTRAVENOUS | Status: AC
  Administered 2016-07-04: 10:00:00 via INTRAVENOUS

## 2016-07-04 MED ORDER — SODIUM CHLORIDE 0.9 % IV SOLN
200.0000 mg | Freq: Once | INTRAVENOUS | Status: AC
  Administered 2016-07-04: 200 mg via INTRAVENOUS
  Filled 2016-07-04: qty 8

## 2016-07-04 MED ORDER — HEPARIN SOD (PORK) LOCK FLUSH 100 UNIT/ML IV SOLN
500.0000 [IU] | Freq: Once | INTRAVENOUS | Status: AC | PRN
  Administered 2016-07-04: 500 [IU]

## 2016-07-04 NOTE — Assessment & Plan Note (Addendum)
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  He was treated with adjuvant systemic chemotherapy consisting of Carboplatin/Abraxane beginning on 03/07/2016, but with progression of disease noted on CT imaging on 05/30/2016 requiring a change in therapy to [place] beginning on 06/13/2016.  PDL1- proportion score of 50- 60% (high expression)  Oncology history is updated.  Staging in [facility] problem list is updated.  Pre-treatment labs as ordered today: CBC diff, CMET, TSH.  I personally reviewed and went over laboratory results with the patient.  The results are noted within this dictation.  Treatment criteria satisfied today. Hemoglobin is noted to be 8.2 g/dL. Message is sent nursing to offer the patient 1 unit packed red blood cell (nursing requested to contact sister regarding this).  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  Now that he has documented Stage IV disease, goal of therapy is palliation and therefore ESA therapy can be considered.  I will discuss this with Dr. Whitney Aguilar.  Today, he reports chest pain. This pain radiates to right shoulder blade. The pain comes and goes and occurs typically with exertion. He reports a dull ache pain with intermittent sharp pains. He denies any diaphoresis or shortness of breath associated with these episodes. He rates the pain as 7 out of 10. EKG is ordered today. EKG shows normal sinus rhythm with right bundle-branch block. Otherwise, EKG is compared to January 2017 EKG and no acute changes are noted.  Given his Stage IV disease, he needs baseline brain imaging.  Therefore, an order is placed for an MRI of brain w and wo contrast.  Return in 3 weeks for treatment and follow-up appointment with continuation of chemotherapy as planned.

## 2016-07-04 NOTE — Progress Notes (Signed)
Tyler Bogus, MD [address] [place] [address]  Squamous cell carcinoma of lung, stage IV, unspecified laterality ([place]) - Plan: TSH, MR Brain W Wo Contrast, EKG 12-Lead, EKG 12-Lead, EKG 12-Lead  Other chest pain - Plan: TSH, MR Brain W Wo Contrast, EKG 12-Lead, EKG 12-Lead, EKG 12-Lead  CURRENT THERAPY: Keytruda every 21 days beginning on 06/13/2016  INTERVAL HISTORY: Tyler Aguilar 60 y.o. 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  He was treated with adjuvant systemic chemotherapy consisting of Carboplatin/Abraxane beginning on 03/07/2016, but with progression of disease noted on CT imaging on 05/30/2016 requiring a change in therapy to [place] beginning on 06/13/2016.  PDL1- proportion score of 50- 60% (high expression)    Squamous cell carcinoma of lung, stage IV ([place])   01/01/2016 Pathology Results    EBUS FNA subcarinal mass, squamous cell carcinoma     01/03/2016 Pathology Results    ALK negative.  ROS1 negative.     01/11/2016 -  Radiation Therapy    RT for palliation of central lung NSCLC at [facility] started on 01/11/2016     01/17/2016 Pathology Results    PD-L1 shows high expression- proportion score of 50- 60%     03/07/2016 - 05/30/2016 Chemotherapy    Carboplatin/Abraxane day 1, 8, 15 every 28 day     03/12/2016 Imaging    CT chest- Pulmonary parenchymal pattern of peribronchovascular ground-glass, nodularity and consolidation is likely new or progressive from chest radiograph 02/27/2016 and may be due to an infectious bronchiolitis/bronchopneumonia.      04/29/2016 Imaging    CT chest- Continued decreased size of the infiltrative subcarinal mass. Significant decrease in patchy  basilar-predominant nodular  interlobular and peribronchovascular interstitial thickening, favoring decreasing lymphangitic tumor.     05/29/2016 Imaging    Bone scan- Focal photopenic abnormality in the left iliac wing. Suspect lytic bone lesion or less likely, artifact.     05/30/2016 Imaging    Pelvis xray- Large lytic lesions in the left iliac wing and proximal left femur consistent with metastatic disease.     05/30/2016 Imaging    CT ADDENDUM: Further review of the 04/29/16 chest CT was performed in light of the L pelvic lytic metastases identified on 05/30/16 There is a slightly expansile lytic osseous metastasis in the right T4 pedicle/pars interarticularis new since 03/12/16     05/30/2016 Progression    Imaging demonstrates new lesions and CT scan was re-reviewed after new left iliac wing lytic lesion.  Review demonstrates a new right T4 lytic lesion new since 03/12/2016.     06/13/2016 -  Chemotherapy    Keytruda     06/23/2016 PET scan    Multifocal sub solid lesions involving the lungs are intensely hypermetabolic and compatible with primary bronchogenic carcinoma. Hypermetabolic sub- carinal lymph node compatible with metastatic adenopathy. Multifocal lytic bone metastases.     06/26/2016 Imaging    MR T-spine- Metastatic disease to T4 vertebral body on the right with progression since 04/29/2016. There is mild tumor in the lateral epidural space on the right without cord compression.     06/27/2016 - 07/04/2016 Radiation Therapy    SRS to left femur ,left iliac by Dr. Lisbeth Renshaw     07/02/2016 Imaging  CT T-spine- Myelogram for SRS treatment planning of destructive T4 metastasis. Small volume epidural tumor on the right without spinal cord compression. Right T4-5 neural foraminal involvement.      [name] has tolerated his first cycle of therapy (immunotherapy) without any complaints.  He notes a chest pain.  He rates as 7 out of 10. He notices goal and sometimes sharp in nature. He notes that it comes and goes and  radiates to his right shoulder blade. It occurs typically with exertion. He denies any shortness of breath or diaphoresis with these symptoms. His vitals are stable.  Review of Systems  Constitutional: Negative.  Negative for fever, chills and weight loss.  HENT: Negative.  Negative for hearing loss and tinnitus.   Eyes: Negative.  Negative for blurred vision and double vision.  Respiratory: Negative for cough, hemoptysis and shortness of breath.   Cardiovascular: Positive for chest pain (7/10, radiates to the right shoulder blade, comes and goes.). Negative for palpitations and leg swelling.  Gastrointestinal: Negative.   Genitourinary: Negative.   Musculoskeletal: Negative.   Skin: Negative.  Negative for itching and rash.  Neurological: Negative.  Negative for headaches.  Endo/Heme/Allergies: Negative.   Psychiatric/Behavioral: Negative.     Past Medical History:  Diagnosis Date  . AAA (abdominal aortic aneurysm) (HCC)    4.5 cm December 2016  . Cardiomyopathy ([place])    LVEF 35% February 2017 [facility]  . COPD (chronic obstructive pulmonary disease) ([place])   . Dyslipidemia   . Lung cancer ([place])    Stage III squamous cell, right lung  . PAF (paroxysmal atrial fibrillation) (HCC)    Versus SVT documented February 2017 [facility]  . 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 BRONCHOSCOPY    . COLONOSCOPY  08/31/2012   Procedure: COLONOSCOPY;  Surgeon: Jamesetta So, MD;  Location: [facility] ENDO SUITE;  Service: Gastroenterology;  Laterality: N/A;  . FINGER SURGERY Right 1979  . PORTACATH PLACEMENT Left 02/27/2016   Procedure: INSERTION PORT-A-CATH LEFT SUBCLAVIAN;  Surgeon: Aviva Signs, MD;  Location: [facility] ORS;  Service: General;  Laterality: Left;    Family History  Problem Relation Age of Onset  . Diabetes Mellitus II Mother   . Cancer Mother   . Cancer Father   .  Coronary artery disease Brother     CABG    Social History   Social History  . Marital status: Married    Spouse name: N/A  . Number of children: N/A  . Years of education: N/A   Social History Main Topics  . Smoking status: Former Smoker    Types: Cigarettes    Quit date: 12/31/2015  . Smokeless tobacco: None  . Alcohol use 0.0 oz/week  . Drug use: No  . Sexual activity: Not Asked   Other Topics Concern  . None   Social History Narrative  . None     PHYSICAL EXAMINATION  ECOG PERFORMANCE STATUS: 1 - Symptomatic but completely ambulatory  Vitals:   07/04/16 0829  BP: 109/69  Pulse: 90  Resp: 16  Temp: 98.2 F (36.8 C)   GENERAL:alert, well nourished, well developed, comfortable, cooperative, smiling and chronically ill appearing and accompanied by his sister today. SKIN: skin color, texture, turgor are normal, no rashes or significant lesions HEAD: Normocephalic, No masses, lesions, tenderness or abnormalities EYES: normal, EOMI, Conjunctiva are pink and non-injected  EARS: External ears normal OROPHARYNX:lips, buccal mucosa, and tongue normal and mucous membranes are moist  NECK: supple, trachea midline LYMPH:  not examined BREAST:not examined LUNGS: clear to auscultation and percussion HEART: regular rate & rhythm ABDOMEN:abdomen soft and normal bowel sounds BACK: Back symmetric, no curvature. EXTREMITIES:less then 2 second capillary refill, no joint deformities, effusion, or inflammation, no skin discoloration, no cyanosis  NEURO: alert & oriented x 3 with fluent speech, no focal motor/sensory deficits, gait normal   LABORATORY DATA: CBC    Component Value Date/Time   WBC 5.3 07/04/2016 0905   RBC 2.50 (L) 07/04/2016 0905   HGB 8.2 (L) 07/04/2016 0905   HCT 25.4 (L) 07/04/2016 0905   PLT 198 07/04/2016 0905   MCV 101.6 (H) 07/04/2016 0905   MCH 32.8 07/04/2016 0905   MCHC 32.3 07/04/2016 0905   RDW 17.3 (H) 07/04/2016 0905   LYMPHSABS 0.3 (L)  07/04/2016 0905   MONOABS 0.6 07/04/2016 0905   EOSABS 0.1 07/04/2016 0905   BASOSABS 0.0 07/04/2016 0905      Chemistry      Component Value Date/Time   NA 133 (L) 07/04/2016 0905   K 3.7 07/04/2016 0905   CL 100 (L) 07/04/2016 0905   CO2 27 07/04/2016 0905   BUN 10 07/04/2016 0905   CREATININE 0.59 (L) 07/04/2016 0905      Component Value Date/Time   CALCIUM 9.0 07/04/2016 0905   ALKPHOS 69 07/04/2016 0905   AST 25 07/04/2016 0905   ALT 25 07/04/2016 0905   BILITOT 0.5 07/04/2016 0905        PENDING LABS:   RADIOGRAPHIC STUDIES:  Ct Thoracic Spine W Contrast  Result Date: 07/02/2016 CLINICAL DATA:  Lung cancer with metastatic disease to the spine. Stereotactic radiosurgery planning for treatment of T4 lesion. FLUOROSCOPY TIME:  Radiation Exposure Index (as provided by the fluoroscopic device): 279.33 microGray*m^2 Fluoroscopy Time (in minutes and seconds):  1 minute 11 seconds PROCEDURE: LUMBAR PUNCTURE FOR THORACIC MYELOGRAM After thorough discussion of risks and benefits of the procedure including bleeding, infection, injury to nerves, blood vessels, adjacent structures as well as headache and CSF leak, written and oral informed consent was obtained. Consent was obtained by Dr. Logan Bores. Patient was positioned prone on the fluoroscopy table. Local anesthesia was provided with 1% lidocaine without epinephrine after prepped and draped in the usual sterile fashion. Puncture was performed at L3-4 using a 3 1/2 inch 22-gauge spinal needle via a right interlaminar approach. Using a single pass through the dura, the needle was placed within the thecal sac, with return of clear CSF. 10 mL of Isovue M 300 was injected into the thecal sac, with normal opacification of the nerve roots and cauda equina consistent with free flow within the subarachnoid space. The patient was then moved to the trendelenburg position and contrast flowed into the Thoracic spine region. I personally performed  the lumbar puncture and administered the intrathecal contrast. I also personally supervised acquisition of the myelogram images. TECHNIQUE: Contiguous axial images were obtained through the Thoracic spine after the intrathecal infusion of infusion. Coronal and sagittal reconstructions were obtained of the axial image sets. COMPARISON:  Thoracic spine MRI 06/26/2016. PET-CT 06/10/2016. Chest CT 04/29/2016. FINDINGS: THORACIC MYELOGRAM FINDINGS: There is minimal left convex curvature of the upper thoracic spine. There is no significant listhesis. A destructive lesion is again seen involving the right aspect of the T4 vertebral body and posterior elements. Assessment of spinal canal patency is deferred to the following  CT given poor visualization of contrast on the conventional myelographic images. A left subclavian Port-A-Cath terminates over the SVC. Aortic atherosclerosis is noted. CT THORACIC MYELOGRAM FINDINGS: There is slight left convex curvature of the upper thoracic spine. There is no listhesis. As described on recent MRI, there is a destructive lesion involving the posterior T4 vertebral body, pedicle, transverse and articular processes, and lamina on the right with mild right-sided vertebral body height loss. There is also involvement of the adjacent right fourth rib. The mass measures 3.8 x 3.3 cm (transverse x AP) and has enlarged from the 04/29/2016 chest CT. Small volume tumor is present in the lateral epidural space on the right with slight displacement of the spinal cord but no spinal cord compression. Tumor extends into and narrows the right T4-5 neural foramen. There is also mild right-sided neural foraminal narrowing at T3-4. No suspicious lytic or blastic osseous lesions are identified elsewhere in the thoracic spine. A small left paracentral disc protrusion is again seen at T10-11 resulting in a slight impression on the spinal cord but no significant stenosis. Facet spurring results in mild right  neural foraminal narrowing at T10-11. The conus medullaris terminates at L1. Advanced atherosclerosis is noted involving the thoracic and visualized abdominal aorta and its major branch vessels as well as coronary arteries. Mildly ectatic ascending thoracic aorta is unchanged. Left subclavian Port-A-Cath terminates in the SVC. 2.7 cm subcarinal mass is unchanged. There is centrilobular emphysema. Evaluation of the lung parenchyma is limited by motion artifact, with mixed ground-glass and consolidative opacities present in the right upper and right lower lobes, incompletely visualized although the right upper lobe opacity appears to of increased from the prior PET-CT. IMPRESSION: 1. 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 Small volume epidural tumor on the right without spinal cord compression. Right T4-5 neural foraminal involvement. 2. Small T10-11 disc protrusion without significant stenosis. 3. Unchanged sub carinal mass. Incompletely evaluated right upper and right lower lobe opacities, with the right upper lobe opacity increasing from the prior PET-CT. 4. Aortic atherosclerosis. Electronically Signed   By: Logan Bores M.D.   On: 07/02/2016 14:31   Mr Thoracic Spine W Wo Contrast  Result Date: 06/26/2016 CLINICAL DATA:  Lung cancer with metastatic disease to spine. Stereotactic radiosurgery planning. EXAM: MRI THORACIC SPINE WITHOUT AND WITH CONTRAST TECHNIQUE: Multiplanar and multiecho pulse sequences of the thoracic spine were obtained without and with intravenous contrast. CONTRAST:  79m MULTIHANCE GADOBENATE DIMEGLUMINE 529 MG/ML IV SOLN COMPARISON:  PET 06/10/2016, CT chest 04/29/2016 FINDINGS: Metastatic disease right T4 vertebra involving the right vertebral body, pedicle, lamina, transverse process, and adjacent soft tissues. The mass measures approximately 32 x 37 mm. There is mild tumor in the lateral epidural space on the right. No cord compression. Spinal cord signal  is normal. No other metastatic deposits identified.  No fracture identified. Central disc protrusion at T10-11 without significant spinal stenosis. Remaining disc spaces normal. IMPRESSION: Metastatic disease to T4 vertebral body on the right with progression since 04/29/2016. There is mild tumor in the lateral epidural space on the right without cord compression. No other metastatic disease. Electronically Signed   By: CFranchot GalloM.D.   On: 06/26/2016 14:10   Nm Pet Image Initial (pi) Skull Base To Thigh  Addendum Date: 06/23/2016   ADDENDUM REPORT: 06/23/2016 14:23 ADDENDUM: The following is a correction to the body of the report under the sub- section [place] : This appears to involve the L4-5 neuroforamina on the right,  image 59 of series 4. SHOULD READ This appears to involve the T4-5 neuroforamina on the right, image 59 of series 4. Electronically Signed   By: Kerby Moors M.D.   On: 06/23/2016 14:23  Result Date: 06/23/2016 CLINICAL DATA:  Initial treatment strategy for lung cancer. EXAM: NUCLEAR MEDICINE PET SKULL BASE TO THIGH TECHNIQUE: 8.1 mCi F-18 FDG was injected intravenously. Full-ring PET imaging was performed from the skull base to thigh after the radiotracer. CT data was obtained and used for attenuation correction and anatomic localization. FASTING BLOOD GLUCOSE:  Value: 112 mg/dl COMPARISON:  04/29/2016 FINDINGS: NECK No hypermetabolic lymph nodes in the neck. CHEST Normal heart size. Aortic atherosclerosis is identified. Calcifications involving the RCA, LAD and left circumflex coronary artery. Sub- carinal mass measures 2.7 cm and has an SUV max equal to 18.07. No pleural effusion identified. Part solid lesion within the right upper lobe measures 2.7 cm and has an SUV max equal to 6.2. The part solid lesion involving the superior segment of the right lower lobe measures 1.7 cm and has an SUV max equal to 6.14, image 41 of series 6. Faint ground-glass nodule within the left upper  lobe measures 7 mm and has an SUV max equal to 1.42, image 30 of series 6. ABDOMEN/PELVIS No abnormal hypermetabolic activity within the liver, pancreas, adrenal glands, or spleen. Infrarenal abdominal aortic aneurysm has a maximum AP diameter of 4.1 cm. No hypermetabolic lymph nodes in the abdomen or pelvis. [place] Multifocal hypermetabolic bone metastases are identified. Expansile and lytic lesion involving the posterior elements of the T4 vertebra measures approximately 3.8 cm and has an SUV max equal to 27.7. This appears to involve the L4-5 neuroforamina on the right, image 59 of series 4. Cannot rule out canal involvement at this level. Large destructive lesion involving the left iliac wing measures 7.1 cm and has an SUV max equal to 23.08. Paraspinal intramuscular lesion is identified posterior to the L5 vertebra. This measures approximately 2.2 cm and has an SUV max equal to 20.32. Finally, there is a destructive lesion involving the posterior cortex of the proximal left femur measuring 2.8 cm. SUV max equals 222.2. IMPRESSION: 1. Multifocal sub solid lesions involving the lungs are intensely hypermetabolic and compatible with primary bronchogenic carcinoma 2. Hypermetabolic sub- carinal lymph node compatible with metastatic adenopathy. 3. Multifocal lytic bone metastases are identified. Lesion involving the posterior elements of the T4 vertebra may be of neurologic significance. The lesion involving the posterior cortex of the left femur may be of orthopedic significance. 4. Aortic atherosclerosis and multi vessel coronary artery calcification. 5. Infrarenal abdominal aortic aneurysm is noted measuring 4.1 cm. Recommend followup by [place] in 1 year. This recommendation follows ACR consensus guidelines: White Paper of the ACR Incidental Findings Committee II on Vascular Findings. J Am Coll Radiol 2013; 10:789-794. Electronically Signed: By: Kerby Moors M.D. On: 06/10/2016 14:43   Dg Myelography Lumbar Inj  Thoracic  Result Date: 07/02/2016 CLINICAL DATA:  Lung cancer with metastatic disease to the spine. Stereotactic radiosurgery planning for treatment of T4 lesion. FLUOROSCOPY TIME:  Radiation Exposure Index (as provided by the fluoroscopic device): 279.33 microGray*m^2 Fluoroscopy Time (in minutes and seconds):  1 minute 11 seconds PROCEDURE: LUMBAR PUNCTURE FOR THORACIC MYELOGRAM After thorough discussion of risks and benefits of the procedure including bleeding, infection, injury to nerves, blood vessels, adjacent structures as well as headache and CSF leak, written and oral informed consent was obtained. Consent was obtained by Dr. Logan Bores. Patient was  positioned prone on the fluoroscopy table. Local anesthesia was provided with 1% lidocaine without epinephrine after prepped and draped in the usual sterile fashion. Puncture was performed at L3-4 using a 3 1/2 inch 22-gauge spinal needle via a right interlaminar approach. Using a single pass through the dura, the needle was placed within the thecal sac, with return of clear CSF. 10 mL of Isovue M 300 was injected into the thecal sac, with normal opacification of the nerve roots and cauda equina consistent with free flow within the subarachnoid space. The patient was then moved to the trendelenburg position and contrast flowed into the Thoracic spine region. I personally performed the lumbar puncture and administered the intrathecal contrast. I also personally supervised acquisition of the myelogram images. TECHNIQUE: Contiguous axial images were obtained through the Thoracic spine after the intrathecal infusion of infusion. Coronal and sagittal reconstructions were obtained of the axial image sets. COMPARISON:  Thoracic spine MRI 06/26/2016. PET-CT 06/10/2016. Chest CT 04/29/2016. FINDINGS: THORACIC MYELOGRAM FINDINGS: There is minimal left convex curvature of the upper thoracic spine. There is no significant listhesis. A destructive lesion is again seen  involving the right aspect of the T4 vertebral body and posterior elements. Assessment of spinal canal patency is deferred to the following CT given poor visualization of contrast on the conventional myelographic images. A left subclavian Port-A-Cath terminates over the SVC. Aortic atherosclerosis is noted. CT THORACIC MYELOGRAM FINDINGS: There is slight left convex curvature of the upper thoracic spine. There is no listhesis. As described on recent MRI, there is a destructive lesion involving the posterior T4 vertebral body, pedicle, transverse and articular processes, and lamina on the right with mild right-sided vertebral body height loss. There is also involvement of the adjacent right fourth rib. The mass measures 3.8 x 3.3 cm (transverse x AP) and has enlarged from the 04/29/2016 chest CT. Small volume tumor is present in the lateral epidural space on the right with slight displacement of the spinal cord but no spinal cord compression. Tumor extends into and narrows the right T4-5 neural foramen. There is also mild right-sided neural foraminal narrowing at T3-4. No suspicious lytic or blastic osseous lesions are identified elsewhere in the thoracic spine. A small left paracentral disc protrusion is again seen at T10-11 resulting in a slight impression on the spinal cord but no significant stenosis. Facet spurring results in mild right neural foraminal narrowing at T10-11. The conus medullaris terminates at L1. Advanced atherosclerosis is noted involving the thoracic and visualized abdominal aorta and its major branch vessels as well as coronary arteries. Mildly ectatic ascending thoracic aorta is unchanged. Left subclavian Port-A-Cath terminates in the SVC. 2.7 cm subcarinal mass is unchanged. There is centrilobular emphysema. Evaluation of the lung parenchyma is limited by motion artifact, with mixed ground-glass and consolidative opacities present in the right upper and right lower lobes, incompletely  visualized although the right upper lobe opacity appears to of increased from the prior PET-CT. IMPRESSION: 1. 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 Small volume epidural tumor on the right without spinal cord compression. Right T4-5 neural foraminal involvement. 2. Small T10-11 disc protrusion without significant stenosis. 3. Unchanged sub carinal mass. Incompletely evaluated right upper and right lower lobe opacities, with the right upper lobe opacity increasing from the prior PET-CT. 4. Aortic atherosclerosis. Electronically Signed   By: Logan Bores M.D.   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  He was treated with adjuvant systemic chemotherapy consisting of Carboplatin/Abraxane beginning on 03/07/2016, but with progression of disease noted on CT imaging on 05/30/2016 requiring a change in therapy to [place] beginning on 06/13/2016.  PDL1- proportion score of 50- 60% (high expression)  Oncology history is updated.  Staging in [facility] problem list is updated.  Pre-treatment labs as ordered today: CBC diff, CMET, TSH.  I personally reviewed and went over laboratory results with the patient.  The results are noted within this dictation.  Treatment criteria satisfied today. Hemoglobin is noted to be 8.2 g/dL. Message is sent nursing to offer the patient 1 unit packed red blood cell (nursing requested to contact sister regarding this).  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  Now that he has documented Stage IV disease, goal of therapy is palliation and therefore ESA therapy can be considered.  I will discuss this with Dr.  Whitney Muse.  Today, he reports chest pain. This pain radiates to right shoulder blade. The pain comes and goes and occurs typically with exertion. He reports a dull ache pain with intermittent sharp pains. He denies any diaphoresis or shortness of breath associated with these episodes. He rates the pain as 7 out of 10. EKG is ordered today. EKG shows normal sinus rhythm with right bundle-branch block. Otherwise, EKG is compared to January 2017 EKG and no acute changes are noted.  Given his Stage IV disease, he needs baseline brain imaging.  Therefore, an order is placed for an MRI of brain w and wo contrast.  Return in 3 weeks for treatment and follow-up appointment with continuation of chemotherapy as planned.     ORDERS PLACED FOR THIS ENCOUNTER: Orders Placed This Encounter  Procedures  . MR Brain W Wo Contrast  . TSH  . EKG 12-Lead  . EKG 12-Lead    MEDICATIONS PRESCRIBED THIS ENCOUNTER: No orders of the defined types were placed in this encounter.   THERAPY PLAN:  Continue with palliative treatment as planned.  All questions were answered. The patient knows to call the clinic with any problems, questions or concerns. We can certainly see the patient much sooner if necessary.  Patient and plan discussed with Dr. Ancil Linsey and she is in agreement with the aforementioned.   This note is electronically signed by: Doy Mince 07/06/2016 6:31 PM

## 2016-07-04 NOTE — Patient Instructions (Signed)
[place] at [facility] Discharge Instructions  RECOMMENDATIONS MADE BY THE CONSULTANT AND ANY TEST RESULTS WILL BE SENT TO YOUR REFERRING PHYSICIAN.  You were seen by [name] today. EKG today for chest pain. Return to  Clinic in 3 weeks for chemo and follow up   Thank you for choosing [place] at [facility] to provide your oncology and hematology care.  To afford each patient quality time with our provider, please arrive at least 15 minutes before your scheduled appointment time.   Beginning January 23rd 2017 lab work for the Tyler Aguilar will be done in the  Main lab at [facility] on 1st floor. If you have a lab appointment with the [facility] please come in thru the  Main Entrance and check in at the main information desk  You need to re-schedule your appointment should you arrive 10 or more minutes late.  We strive to give you quality time with our providers, and arriving late affects you and other patients whose appointments are after yours.  Also, if you no show three or more times for appointments you may be dismissed from the clinic at the providers discretion.     Again, thank you for choosing [facility].  Our hope is that these requests will decrease the amount of time that you wait before being seen by our physicians.       _____________________________________________________________  Should you have questions after your visit to [facility], please contact our office at [phone] between the hours of 8:30 a.m. and 4:30 p.m.  Voicemails left after 4:30 p.m. will not be returned until the following business day.  For prescription refill requests, have your pharmacy contact our office.         Resources For Cancer Patients and their Caregivers ? American Cancer Society: Can assist with transportation, wigs, general needs, runs Look Good Feel Better.        (705)554-7828 ? Cancer  Care: Provides financial assistance, online support groups, medication/co-pay assistance.  1-800-813-HOPE (517)773-5452) ? [facility] Assists [place] cancer patients and their families through emotional , educational and financial support.  684-031-9525 ? [place] DSS Where to apply for food stamps, Medicaid and utility assistance. [phone] ? RCATS: Transportation to medical appointments. 769-028-8338 ? Social Security Administration: May apply for disability if have a Stage IV cancer. (908)478-1325 (782)838-4363 ? [employer], Disability and Transit Services: Assists with nutrition, care and transit needs. [place] Support Programs: '@10RELATIVEDAYS'$ @ > Cancer Support Group  2nd Tuesday of the month 1pm-2pm, Journey Room  > Creative Journey  3rd Tuesday of the month 1130am-1pm, Journey Room  > Look Good Feel Better  1st Wednesday of the month 10am-12 noon, Journey Room (Call [place] to register [phone])

## 2016-07-04 NOTE — Patient Instructions (Signed)
[facility] Discharge Instructions for Patients Receiving Chemotherapy   Beginning January 23rd 2017 lab work for the Cancer Center will be done in the  Main lab at  on 1st floor. If you have a lab appointment with the Cancer Center please come in thru the  Main Entrance and check in at the main information desk   Today you received the following chemotherapy agents:  Keytruda  If you develop nausea and vomiting, or diarrhea that is not controlled by your medication, call the clinic.  The clinic phone number is [phone]. Office hours are Monday-Friday 8:30am-5:00pm.  BELOW ARE SYMPTOMS THAT SHOULD BE REPORTED IMMEDIATELY:  *FEVER GREATER THAN 101.0 F  *CHILLS WITH OR WITHOUT FEVER  NAUSEA AND VOMITING THAT IS NOT CONTROLLED WITH YOUR NAUSEA MEDICATION  *UNUSUAL SHORTNESS OF BREATH  *UNUSUAL BRUISING OR BLEEDING  TENDERNESS IN MOUTH AND THROAT WITH OR WITHOUT PRESENCE OF ULCERS  *URINARY PROBLEMS  *BOWEL PROBLEMS  UNUSUAL RASH Items with * indicate a potential emergency and should be followed up as soon as possible. If you have an emergency after office hours please contact your primary care physician or go to the nearest emergency department.  Please call the clinic during office hours if you have any questions or concerns.   You may also contact the Patient Navigator at [phone] should you have any questions or need assistance in obtaining follow up care.      Resources For Cancer Patients and their Caregivers ? American Cancer Society: Can assist with transportation, wigs, general needs, runs Look Good Feel Better.        1-888-227-6333 ? Cancer Care: Provides financial assistance, online support groups, medication/co-pay assistance.  1-800-813-HOPE (4673) ? [facility] Assists [place] cancer patients and their families through emotional , educational and financial support.   336-427-4357 ? [place] DSS Where to apply for food stamps, Medicaid and utility assistance. 336-342-1394 ? RCATS: Transportation to medical appointments. 336-347-2287 ? Social Security Administration: May apply for disability if have a Stage IV cancer. 336-342-7796 1-800-772-1213 ? [place] Aging, Disability and Transit Services: Assists with nutrition, care and transit needs. 336-349-2343         

## 2016-07-04 NOTE — Op Note (Signed)
   Name: Tyler Aguilar  MRN: [id number]  Date: 07/04/2016   DOB: Aug 05, 1956  Stereotactic Radiosurgery Operative Note  PRE-OPERATIVE DIAGNOSIS:  Spinal Metastasis  POST-OPERATIVE DIAGNOSIS:  Spinal Metastasis  PROCEDURE:  Stereotactic Radiosurgery  SURGEON:  Charlie Pitter, MD  NARRATIVE: The patient underwent a radiation treatment planning session in the radiation oncology simulation suite under the care of the radiation oncology physician and physicist.  I participated closely in the radiation treatment planning afterwards. The patient underwent planning CT myelogram which was fused to the MRI.  These images were fused on the planning system.  Radiation oncology contoured the gross target volume and subsequently expanded this to yield the Planning Target Volume. I actively participated in the planning process.  I helped to define and review the target contours and also the contours of the spinal cord, and selected nearby organs at risk.  All the dose constraints for critical structures were reviewed and compared to AAPM Task Group 101.  The prescription dose conformity was reviewed.  I approved the plan electronically.    Accordingly, Tyler Aguilar was brought to the TrueBeam stereotactic radiation treatment linac and placed in the custom immobilization device.  The patient was aligned according to the IR fiducial markers with BrainLab Exactrac, then orthogonal x-rays were used in ExacTrac with the 6DOF robotic table and the shifts were made to align the patient.  Then conebeam CT was performed to verify precision.  Tyler Aguilar received stereotactic radiosurgery uneventfully.  The detailed description of the procedure is recorded in the radiation oncology procedure note.  I was present for the duration of the procedure.  DISPOSITION:  Following delivery, the patient was transported to nursing in stable condition and monitored for possible acute effects to be discharged to home in stable  condition with follow-up in one month.  Charlie Pitter, MD 07/04/2016 1:56 PM

## 2016-07-05 NOTE — Progress Notes (Signed)
Department of Radiation Oncology  Phone:  [phone] Fax:        [phone]  Weekly Treatment Note    Name: Tyler Aguilar Date: 07/05/2016 MRN: [id number] DOB: 1956-04-02   Diagnosis:     ICD-9-CM ICD-10-CM   1. Bone metastasis (HCC) 198.5 C79.51      Current dose: 30 Gy  Current fraction: 10   MEDICATIONS: Current Outpatient Prescriptions  Medication Sig Dispense Refill  . aspirin 81 MG tablet Take 81 mg by mouth daily.    Marland Kitchen atorvastatin (LIPITOR) 80 MG tablet Take 80 mg by mouth every evening.    . budesonide-formoterol (SYMBICORT) 160-4.5 MCG/ACT inhaler Inhale 2 puffs into the lungs 2 (two) times daily.    Marland Kitchen CARBOPLATIN IV Inject into the vein. Reported on 06/16/2016    . carvedilol (COREG) 3.125 MG tablet Take 1 tablet (3.125 mg total) by mouth 2 (two) times daily. 180 tablet 3  . cholecalciferol (VITAMIN D) 1000 units tablet Take 1,000 Units by mouth daily.    Marland Kitchen escitalopram (LEXAPRO) 10 MG tablet Take 1 tablet (10 mg total) by mouth daily. 90 tablet 1  . fentaNYL (DURAGESIC - DOSED MCG/HR) 12 MCG/HR Place 1 patch (12.5 mcg total) onto the skin every 3 (three) days. 10 patch 0  . levofloxacin (LEVAQUIN) 500 MG tablet Take 1 tablet (500 mg total) by mouth daily. 10 tablet 0  . lidocaine-prilocaine (EMLA) cream Apply a quarter size amount to port site 1 hour prior to chemo. Do not rub in. Cover with plastic wrap. 30 g 3  . LORazepam (ATIVAN) 1 MG tablet Take 1 tablet (1 mg total) by mouth 3 (three) times daily as needed for anxiety. 60 tablet 0  . losartan (COZAAR) 25 MG tablet Take 0.5 tablets (12.5 mg total) by mouth daily. 45 tablet 3  . oxyCODONE (ROXICODONE) 5 MG immediate release tablet Take 1 tablet (5 mg total) by mouth every 6 (six) hours as needed for severe pain. 60 tablet 0  . PACLitaxel Protein-Bound Part (ABRAXANE IV) Inject into the vein. Reported on 06/16/2016    . pantoprazole (PROTONIX) 40 MG tablet Take 1 tablet (40 mg total) by mouth daily. 90  tablet 1  . pyridOXINE (VITAMIN B-6) 50 MG tablet Take 50 mg by mouth daily.    . ranitidine (ZANTAC) 150 MG tablet Take 1 tablet (150 mg total) by mouth 2 (two) times daily. 180 tablet 1  . rivaroxaban (XARELTO) 20 MG TABS tablet Take 1 tablet (20 mg total) by mouth daily. 90 tablet 2  . Spacer/Aero-Holding Chambers (AEROCHAMBER PLUS FLO-VU) MISC     . SPIRIVA RESPIMAT 2.5 MCG/ACT AERS INHALE 2 PUFFS INTO LUNGS DAILY 1 Inhaler 3   No current facility-administered medications for this encounter.     ALLERGIES: Review of patient's allergies indicates no known allergies.   LABORATORY DATA:  Lab Results  Component Value Date   WBC 5.3 07/04/2016   HGB 8.2* 07/04/2016   HCT 25.4* 07/04/2016   MCV 101.6* 07/04/2016   PLT 198 07/04/2016   Lab Results  Component Value Date   NA 133* 07/04/2016   K 3.7 07/04/2016   CL 100* 07/04/2016   CO2 27 07/04/2016   Lab Results  Component Value Date   ALT 25 07/04/2016   AST 25 07/04/2016   ALKPHOS 69 07/04/2016   BILITOT 0.5 07/04/2016     NARRATIVE: Jodell Cipro Mczeal was seen today for weekly treatment management. The chart was checked and the patient's films  were reviewed.  The patient completed his final fraction of radiation treatment to the left femur and the left iliac region today. He has done well. He has not had any difficulties with treatment. No GI issues. The patient also completed radiosurgery to the T4 vertebral body.  PHYSICAL EXAMINATION: vitals were not taken for this visit.     Alert, no acute distress  ASSESSMENT: The patient is doing satisfactorily with treatment.  PLAN: The patient will return to clinic in 1 month.

## 2016-07-05 NOTE — Progress Notes (Signed)
  Radiation Oncology         [phone] ________________________________  Name: Tyler Aguilar MRN: [id number]  Date: 06/27/2016  DOB: 08-Jul-1956  DIAGNOSIS:     ICD-9-CM ICD-10-CM   1. Bone metastasis ([facility]) 198.5 C79.51     NARRATIVE:  The patient was brought to the [facility].  Identity was confirmed.  All relevant records and images related to the planned course of therapy were reviewed.  The patient freely provided informed written consent to proceed with treatment after reviewing the details related to the planned course of therapy. The consent form was witnessed and verified by the simulation staff. Intravenous access was established for contrast administration. Then, the patient was set-up in a stable reproducible supine position for radiation therapy.  A customized vac lock bag was then constructed for patient immobilization.  CT images were obtained.  Surface markings were placed.  The CT images were loaded into the planning software and fused with the patient's targeting MRI scan.  Then the target and avoidance structures were contoured.  Treatment planning then occurred.  The radiation prescription was entered and confirmed.  I have requested 3D planning  I have requested a DVH of the following structures: Target, spinal cord, esophagus.    SPECIAL TREATMENT PROCEDURE:  The planned course of therapy using radiation constitutes a special treatment procedure. Special care is required in the management of this patient for the following reasons. This treatment constitutes a Special Treatment Procedure for the following reason: High dose per fraction requiring special monitoring for increased toxicities of treatment including daily imaging.  The special nature of the planned course of radiotherapy will require increased physician supervision and oversight to ensure patient's safety with optimal treatment outcomes.  PLAN:  The patient will receive 18 Gy in 1  fraction.   ------------------------------------------------  Jodelle Gross, MD, PhD

## 2016-07-05 NOTE — Progress Notes (Signed)
  Radiation Oncology         [phone]) [phone] ________________________________  Name: ALFIE RIDEAUX MRN: [id number]  Date: 07/04/2016  DOB: [birth year]   SPECIAL TREATMENT PROCEDURE   3D TREATMENT PLANNING AND DOSIMETRY: The patient's radiation plan was reviewed and approved by Dr. Trenton Gammon from neurosurgery and radiation oncology prior to treatment. It showed 3-dimensional radiation distributions overlaid onto the planning CT/MRI image set. The [facility] for the target structures as well as the organs at risk were reviewed. The documentation of the 3D plan and dosimetry are filed in the radiation oncology EMR.   NARRATIVE: The patient was brought to the TrueBeam stereotactic radiation treatment machine and placed supine on the CT couch. The patient was placed in the BodyFix bag to set up the patient for stereotactic radiosurgery and vacuum immobilization was applied. Neurosurgery was present for the set-up and delivery   SIMULATION VERIFICATION: In the couch zero-angle position, the patient underwent Exactrac imaging using the Brainlab system with orthogonal KV images. These were carefully aligned and repeated to confirm treatment position for each of the isocenters. The Exactrac snap film verification was repeated at each couch angle.   SPECIAL TREATMENT PROCEDURE: The patient received stereotactic radiosurgery to the following targets:  T4 spine target was treated using 3 Arcs to a prescription dose of 18 Gy to the gross tumor volume and 16 gray to the clinical target volume. ExacTrac Snap verification was performed for each couch angle.   STEREOTACTIC TREATMENT MANAGEMENT: Following delivery, the patient was transported to nursing in stable condition and monitored for possible acute effects. Vital signs were recorded . The patient tolerated treatment without significant acute effects, and was discharged to home in stable condition.  PLAN: Follow-up in one  month.   ------------------------------------------------  Jodelle Gross, MD, PhD

## 2016-07-07 ENCOUNTER — Ambulatory Visit: Payer: Medicare PPO

## 2016-07-07 ENCOUNTER — Encounter (HOSPITAL_COMMUNITY): Payer: Self-pay | Admitting: Hematology & Oncology

## 2016-07-07 ENCOUNTER — Telehealth: Payer: Self-pay | Admitting: Cardiology

## 2016-07-08 NOTE — Telephone Encounter (Signed)
Tyler Aguilar with [employer] patient assistance states that pt has been approved until the end of the year.

## 2016-07-09 ENCOUNTER — Encounter: Payer: Self-pay | Admitting: Skilled Nursing Facility1

## 2016-07-09 NOTE — Progress Notes (Signed)
To assist the pt in identifying dietary strategies to gain lost wt.   Pt identified as being malnourished due to lost wt. Pt was unavailable.   Dietitian left a message prompting the pt to contact [facility] at [phone].

## 2016-07-10 ENCOUNTER — Other Ambulatory Visit (HOSPITAL_COMMUNITY): Payer: Self-pay | Admitting: Oncology

## 2016-07-10 ENCOUNTER — Other Ambulatory Visit (HOSPITAL_COMMUNITY): Payer: Self-pay | Admitting: Emergency Medicine

## 2016-07-10 NOTE — Progress Notes (Unsigned)
Santiago Glad sister called and wanted to see if she need to switch the place her brother was having his MRI from [facility] to [facility] because Mont Dutton the navigator at [facility] told them that it would be more precise  to have it completed there. Spoke with Kirby Crigler PA who called [facility] imaging and although [facility] is more precise in metastatic brain this pt is just for screening and his MRI can be completed at [facility].

## 2016-07-15 ENCOUNTER — Telehealth (HOSPITAL_COMMUNITY): Payer: Self-pay | Admitting: *Deleted

## 2016-07-16 NOTE — Telephone Encounter (Signed)
Spoke with Tyler Aguilar, she said he is doing a little better, chest is sore from coughing but is doing ok. Will call if anything changes

## 2016-07-17 ENCOUNTER — Other Ambulatory Visit (HOSPITAL_COMMUNITY): Payer: Self-pay | Admitting: Oncology

## 2016-07-17 ENCOUNTER — Telehealth (HOSPITAL_COMMUNITY): Payer: Self-pay

## 2016-07-17 DIAGNOSIS — C3490 Malignant neoplasm of unspecified part of unspecified bronchus or lung: Secondary | ICD-10-CM

## 2016-07-17 MED ORDER — FENTANYL 25 MCG/HR TD PT72: 25.0000 ug | MEDICATED_PATCH | TRANSDERMAL | 0 refills | Status: DC

## 2016-07-17 NOTE — Telephone Encounter (Signed)
Called Rolene Arbour regarding [employer], informed her to tell him to try Delsym for cough, vicks vapor rub for chest soreness, also increased his fentanyl patch, script in book at desk for him to pick up.

## 2016-07-21 ENCOUNTER — Ambulatory Visit (HOSPITAL_COMMUNITY)
Admission: RE | Admit: 2016-07-21 | Discharge: 2016-07-21 | Disposition: A | Discharge status: Home / Self Care | Payer: Medicare PPO | Source: Ambulatory Visit | Attending: Oncology | Admitting: Oncology

## 2016-07-21 ENCOUNTER — Other Ambulatory Visit (HOSPITAL_COMMUNITY): Payer: Self-pay | Admitting: Oncology

## 2016-07-21 DIAGNOSIS — R0789 Other chest pain: Secondary | ICD-10-CM

## 2016-07-21 DIAGNOSIS — I6782 Cerebral ischemia: Secondary | ICD-10-CM | POA: Insufficient documentation

## 2016-07-21 DIAGNOSIS — C3490 Malignant neoplasm of unspecified part of unspecified bronchus or lung: Secondary | ICD-10-CM | POA: Diagnosis not present

## 2016-07-21 DIAGNOSIS — I2510 Atherosclerotic heart disease of native coronary artery without angina pectoris: Secondary | ICD-10-CM | POA: Diagnosis not present

## 2016-07-21 DIAGNOSIS — J069 Acute upper respiratory infection, unspecified: Secondary | ICD-10-CM

## 2016-07-21 MED ORDER — AZITHROMYCIN 250 MG PO TABS: ORAL_TABLET | ORAL | 0 refills | Status: DC

## 2016-07-21 MED ORDER — GADOBENATE DIMEGLUMINE 529 MG/ML IV SOLN
15.0000 mL | Freq: Once | INTRAVENOUS | Status: AC | PRN
  Administered 2016-07-21: 13 mL via INTRAVENOUS

## 2016-07-23 ENCOUNTER — Ambulatory Visit: Payer: Medicare PPO | Admitting: Cardiology

## 2016-07-25 ENCOUNTER — Encounter (HOSPITAL_COMMUNITY): Payer: Self-pay | Admitting: Oncology

## 2016-07-25 ENCOUNTER — Encounter (HOSPITAL_COMMUNITY): Payer: Medicare PPO | Attending: Hematology & Oncology | Admitting: Oncology

## 2016-07-25 ENCOUNTER — Encounter (HOSPITAL_BASED_OUTPATIENT_CLINIC_OR_DEPARTMENT_OTHER): Payer: Medicare PPO

## 2016-07-25 VITALS — BP 99/65 | HR 88 | Temp 97.6°F | Resp 20 | Wt 127.6 lb

## 2016-07-25 DIAGNOSIS — R938 Abnormal findings on diagnostic imaging of other specified body structures: Secondary | ICD-10-CM | POA: Diagnosis present

## 2016-07-25 DIAGNOSIS — C3491 Malignant neoplasm of unspecified part of right bronchus or lung: Secondary | ICD-10-CM

## 2016-07-25 DIAGNOSIS — R05 Cough: Secondary | ICD-10-CM

## 2016-07-25 DIAGNOSIS — R0789 Other chest pain: Secondary | ICD-10-CM

## 2016-07-25 DIAGNOSIS — J069 Acute upper respiratory infection, unspecified: Secondary | ICD-10-CM

## 2016-07-25 DIAGNOSIS — D649 Anemia, unspecified: Secondary | ICD-10-CM

## 2016-07-25 DIAGNOSIS — C3490 Malignant neoplasm of unspecified part of unspecified bronchus or lung: Secondary | ICD-10-CM

## 2016-07-25 DIAGNOSIS — R042 Hemoptysis: Secondary | ICD-10-CM | POA: Diagnosis present

## 2016-07-25 DIAGNOSIS — Z5112 Encounter for antineoplastic immunotherapy: Secondary | ICD-10-CM

## 2016-07-25 LAB — COMPREHENSIVE METABOLIC PANEL
ALBUMIN: 2.2 g/dL — AB (ref 3.5–5.0)
ALK PHOS: 83 U/L (ref 38–126)
ALT: 31 U/L (ref 17–63)
ANION GAP: 8 (ref 5–15)
AST: 25 U/L (ref 15–41)
BUN: 9 mg/dL (ref 6–20)
CHLORIDE: 97 mmol/L — AB (ref 101–111)
CO2: 27 mmol/L (ref 22–32)
Calcium: 8.2 mg/dL — ABNORMAL LOW (ref 8.9–10.3)
Creatinine, Ser: 0.55 mg/dL — ABNORMAL LOW (ref 0.61–1.24)
GFR calc Af Amer: >60 mL/min (ref >60)
GFR calc non Af Amer: >60 mL/min (ref >60)
GLUCOSE: 94 mg/dL (ref 65–99)
Potassium: 3.6 mmol/L (ref 3.5–5.1)
SODIUM: 132 mmol/L — AB (ref 135–145)
Total Bilirubin: 0.6 mg/dL (ref 0.3–1.2)
Total Protein: 6.4 g/dL — ABNORMAL LOW (ref 6.5–8.1)

## 2016-07-25 LAB — CBC WITH DIFFERENTIAL/PLATELET
BASOS PCT: 0 %
Basophils Absolute: 0 10*3/uL (ref 0.0–0.1)
EOS ABS: 0.1 10*3/uL (ref 0.0–0.7)
EOS PCT: 1 %
HCT: 21.7 % — ABNORMAL LOW (ref 39.0–52.0)
HEMOGLOBIN: 7.1 g/dL — AB (ref 13.0–17.0)
Lymphocytes Relative: 4 %
Lymphs Abs: 0.3 10*3/uL — ABNORMAL LOW (ref 0.7–4.0)
MCH: 32.9 pg (ref 26.0–34.0)
MCHC: 32.7 g/dL (ref 30.0–36.0)
MCV: 100.5 fL — ABNORMAL HIGH (ref 78.0–100.0)
MONOS PCT: 10 %
Monocytes Absolute: 0.8 10*3/uL (ref 0.1–1.0)
NEUTROS PCT: 85 %
Neutro Abs: 7 10*3/uL (ref 1.7–7.7)
PLATELETS: 187 10*3/uL (ref 150–400)
RBC: 2.16 MIL/uL — AB (ref 4.22–5.81)
RDW: 17.3 % — ABNORMAL HIGH (ref 11.5–15.5)
WBC: 8.3 10*3/uL (ref 4.0–10.5)

## 2016-07-25 LAB — TSH: TSH: 1.26 u[IU]/mL (ref 0.350–4.500)

## 2016-07-25 LAB — ABO/RH: ABO/RH(D): B POS

## 2016-07-25 LAB — PREPARE RBC (CROSSMATCH)

## 2016-07-25 MED ORDER — HEPARIN SOD (PORK) LOCK FLUSH 100 UNIT/ML IV SOLN
INTRAVENOUS | Status: AC
  Filled 2016-07-25: qty 5

## 2016-07-25 MED ORDER — SODIUM CHLORIDE 0.9 % IV SOLN
200.0000 mg | Freq: Once | INTRAVENOUS | Status: AC
  Administered 2016-07-25: 200 mg via INTRAVENOUS
  Filled 2016-07-25: qty 4

## 2016-07-25 MED ORDER — SODIUM CHLORIDE 0.9% FLUSH
10.0000 mL | INTRAVENOUS | Status: DC | PRN
  Administered 2016-07-25: 10 mL
  Filled 2016-07-25: qty 10

## 2016-07-25 MED ORDER — SODIUM CHLORIDE 0.9 % IV SOLN
Freq: Once | INTRAVENOUS | Status: AC
  Administered 2016-07-25: 12:00:00 via INTRAVENOUS

## 2016-07-25 MED ORDER — LEVOFLOXACIN 500 MG PO TABS: 500.0000 mg | ORAL_TABLET | Freq: Every day | ORAL | 0 refills | Status: DC

## 2016-07-25 MED ORDER — HEPARIN SOD (PORK) LOCK FLUSH 100 UNIT/ML IV SOLN
500.0000 [IU] | Freq: Once | INTRAVENOUS | Status: AC | PRN
  Administered 2016-07-25: 500 [IU]
  Filled 2016-07-25: qty 5

## 2016-07-25 NOTE — Patient Instructions (Signed)
[facility] Discharge Instructions for Patients Receiving Chemotherapy   Beginning January 23rd 2017 lab work for the Cancer Center will be done in the  Main lab at  on 1st floor. If you have a lab appointment with the Cancer Center please come in thru the  Main Entrance and check in at the main information desk   Today you received the following chemotherapy agents Keytruda. Follow-up as scheduled. Call clinic for any questions or concerns  To help prevent nausea and vomiting after your treatment, we encourage you to take your nausea medication   If you develop nausea and vomiting, or diarrhea that is not controlled by your medication, call the clinic.  The clinic phone number is [phone]. Office hours are Monday-Friday 8:30am-5:00pm.  BELOW ARE SYMPTOMS THAT SHOULD BE REPORTED IMMEDIATELY:  *FEVER GREATER THAN 101.0 F  *CHILLS WITH OR WITHOUT FEVER  NAUSEA AND VOMITING THAT IS NOT CONTROLLED WITH YOUR NAUSEA MEDICATION  *UNUSUAL SHORTNESS OF BREATH  *UNUSUAL BRUISING OR BLEEDING  TENDERNESS IN MOUTH AND THROAT WITH OR WITHOUT PRESENCE OF ULCERS  *URINARY PROBLEMS  *BOWEL PROBLEMS  UNUSUAL RASH Items with * indicate a potential emergency and should be followed up as soon as possible. If you have an emergency after office hours please contact your primary care physician or go to the nearest emergency department.  Please call the clinic during office hours if you have any questions or concerns.   You may also contact the Patient Navigator at [phone] should you have any questions or need assistance in obtaining follow up care.      Resources For Cancer Patients and their Caregivers ? American Cancer Society: Can assist with transportation, wigs, general needs, runs Look Good Feel Better.        1-888-227-6333 ? Cancer Care: Provides financial assistance, online support groups, medication/co-pay assistance.  1-800-813-HOPE  (4673) ? [facility] Assists [place] cancer patients and their families through emotional , educational and financial support.  336-427-4357 ? [place] DSS Where to apply for food stamps, Medicaid and utility assistance. 336-342-1394 ? RCATS: Transportation to medical appointments. 336-347-2287 ? Social Security Administration: May apply for disability if have a Stage IV cancer. 336-342-7796 1-800-772-1213 ? [place] Aging, Disability and Transit Services: Assists with nutrition, care and transit needs. 336-349-2343         

## 2016-07-25 NOTE — Progress Notes (Signed)
[name], MD [address] [place] [address]  Squamous cell carcinoma of lung, stage IV, unspecified laterality ([place]) - Plan: CT Abdomen Pelvis W Contrast, CT Chest W Contrast  URI (upper respiratory infection) - Plan: levofloxacin (LEVAQUIN) 500 MG tablet  Anemia, unspecified anemia type - Plan: Practitioner attestation of consent, Complete patient signature process for consent form, Care order/instruction, 0.9 %  sodium chloride infusion, sodium chloride flush (NS) 0.9 % injection 10 mL, heparin lock flush 100 unit/mL, heparin lock flush 100 unit/mL, sodium chloride flush (NS) 0.9 % injection 3 mL, Prepare RBC, Transfuse RBC, Type and screen, diphenhydrAMINE (BENADRYL) injection 25 mg, acetaminophen (TYLENOL) tablet 650 mg, Vitamin B12, Folate, Iron and TIBC, Ferritin  CURRENT THERAPY: Keytruda every 21 days beginning on 06/13/2016  INTERVAL HISTORY: Tyler Aguilar 60 y.o. 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  He was treated with adjuvant systemic chemotherapy consisting of Carboplatin/Abraxane beginning on 03/07/2016, but with progression of disease noted on CT imaging on 05/30/2016 requiring a change in therapy to [place] beginning on 06/13/2016.  PDL1- proportion score of 50- 60% (high expression)    Squamous cell carcinoma of lung, stage IV ([place])   01/01/2016 Pathology Results    EBUS FNA subcarinal mass, squamous cell carcinoma     01/03/2016 Pathology Results    ALK negative.  ROS1 negative.     01/11/2016 -  Radiation Therapy    RT for palliation of central lung NSCLC at [place] started on 01/11/2016     01/17/2016 Pathology Results    PD-L1 shows high expression- proportion score of 50- 60%     03/07/2016 -  05/30/2016 Chemotherapy    Carboplatin/Abraxane day 1, 8, 15 every 28 day     03/12/2016 Imaging    CT chest- Pulmonary parenchymal pattern of peribronchovascular ground-glass, nodularity and consolidation is likely new or progressive from chest radiograph 02/27/2016 and may be due to an infectious bronchiolitis/bronchopneumonia.      04/29/2016 Imaging    CT chest- Continued decreased size of the infiltrative subcarinal mass. Significant decrease in patchy  basilar-predominant nodular interlobular and peribronchovascular interstitial thickening, favoring decreasing lymphangitic tumor.     05/29/2016 Imaging    Bone scan- Focal photopenic abnormality in the left iliac wing. Suspect lytic bone lesion or less likely, artifact.     05/30/2016 Imaging    Pelvis xray- Large lytic lesions in the left iliac wing and proximal left femur consistent with metastatic disease.     05/30/2016 Imaging    CT ADDENDUM: Further review of the 04/29/16 chest CT was performed in light of the L pelvic lytic metastases identified on 05/30/16 There is a slightly expansile lytic osseous metastasis in the right T4 pedicle/pars interarticularis new since 03/12/16     05/30/2016 Progression    Imaging demonstrates new lesions and CT scan was re-reviewed after new left iliac wing lytic lesion.  Review demonstrates a new right T4 lytic lesion new since 03/12/2016.     06/13/2016 -  Chemotherapy    Keytruda     06/23/2016 PET scan    Multifocal sub solid lesions involving the lungs are intensely hypermetabolic and compatible with primary bronchogenic carcinoma. Hypermetabolic sub- carinal lymph node compatible with metastatic adenopathy. Multifocal lytic  bone metastases.     06/26/2016 Imaging    MR T-spine- Metastatic disease to T4 vertebral body on the right with progression since 04/29/2016. There is mild tumor in the lateral epidural space on the right without cord compression.     06/27/2016 - 07/04/2016 Radiation Therapy     SRS to left femur ,left iliac by Dr. Lisbeth Renshaw     07/02/2016 Imaging    CT T-spine- Myelogram for SRS treatment planning of destructive T4 metastasis. Small volume epidural tumor on the right without spinal cord compression. Right T4-5 neural foraminal involvement.     07/21/2016 Imaging    MRI brain- Negative for metastatic disease      Keytruda side effects:  Lung problems (pneumonitis):   Shortness of breath   Chest pain    New or worse cough  Intestinal problems (colitis):   Diarrhea or more bowel movements than usual   Stools that are black, tarry, sticky, or have bloodwork mucous   Severe stomach (abdominal) pain or tenderness  Liver problems (hepatitis):   Yellowing of skin or the sclera of eyes   Nausea or vomiting   Pain on the right side of abdomen   Dark urine   Feeling less hungry than usual   Bleeding or bruising more easily than normal  Hormone gland problems (thyroid, pituitary, adrenal glands, and pancreas):   Rapid heartbeat   Weight loss or weight gain   Increased sweating   Feeling more hungry or thirsty   Urinating more than usual   Hair loss   Feeling cold   Constipation   Voice changes (voice getting deeper)   Muscle aches   Dizziness or fainting   Headaches that will not go away, or unusual headaches  Kidney problems (nephritis and kidney failure):   Change in amount of urine or color of urine   Problems and other organs:   Rash   Change in eyesight   Severe or persistent muscle or joint pains   Severe muscle weakness  Infusion (IV) reactions:   Chills or shaking   Shortness of breath or wheezing   Itching or rash   Flushing   Dizziness   Fevers   Feeling like passing out   Review of Systems  Constitutional: Positive for weight loss. Negative for chills and fever.  HENT: Negative.  Negative for hearing loss and tinnitus.   Eyes: Negative.  Negative for blurred vision and double vision.  Respiratory: Positive for cough (productive) and  sputum production (green sputum). Negative for hemoptysis and shortness of breath.   Cardiovascular: Negative for chest pain, palpitations and leg swelling.  Gastrointestinal: Negative.  Negative for blood in stool, diarrhea, melena, nausea and vomiting.  Genitourinary: Negative.   Musculoskeletal: Negative.   Skin: Negative.  Negative for itching and rash.  Neurological: Negative.  Negative for headaches.  Endo/Heme/Allergies: Negative.   Psychiatric/Behavioral: Negative.     Past Medical History:  Diagnosis Date  . AAA (abdominal aortic aneurysm) (HCC)    4.5 cm December 2016  . Cardiomyopathy ([place])    LVEF 35% February 2017 [facility]  . COPD (chronic obstructive pulmonary disease) ([place])   . Dyslipidemia   . Lung cancer ([place])    Stage III squamous cell, right lung  . PAF (paroxysmal atrial fibrillation) (HCC)    Versus SVT documented February 2017 [facility]  . 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 BRONCHOSCOPY    . COLONOSCOPY  08/31/2012   Procedure: COLONOSCOPY;  Surgeon: Jamesetta So, MD;  Location: [facility] END[facility] SUITE;  Service: Gastroenterology;  Laterality: N/A;  . FINGER SURGERY Right 1979  . PORTACATH PLACEMENT Left 02/27/2016   Procedure: INSERTION PORT-A-CATH LEFT SUBCLAVIAN;  Surgeon: Aviva Signs, MD;  Location: [facility] ORS;  Service: General;  Laterality: Left;    Family History  Problem Relation Age of Onset  . Diabetes Mellitus II Mother   . Cancer Mother   . Cancer Father   . Coronary artery disease Brother     CABG    Social History   Social History  . Marital status: Married    Spouse name: N/A  . Number of children: N/A  . Years of education: N/A   Social History Main Topics  . Smoking status: Former Smoker    Types: Cigarettes    Quit date: 12/31/2015  . Smokeless tobacco: Former Systems developer  . Alcohol use 0.0 oz/week  . Drug use: No  .  Sexual activity: Not Asked   Other Topics Concern  . None   Social History Narrative  . None     PHYSICAL EXAMINATION  ECOG PERFORMANCE STATUS: 1 - Symptomatic but completely ambulatory  There were no vitals filed for this visit.  BP 110/66 P100 R 18 T 98.4 O2 Sat 100% on RA.  GENERAL:alert, comfortable, cooperative, smiling and chronically ill appearing and accompanied by his sister today, sitting in chemo-recliner, weight loss noted. SKIN: pale, texture, turgor are normal, no rashes or significant lesions HEAD: Normocephalic, No masses, lesions, tenderness or abnormalities EYES: normal, EOMI, Conjunctiva are pink and non-injected EARS: External ears normal OROPHARYNX:lips, buccal mucosa, and tongue normal and mucous membranes are moist  NECK: supple, trachea midline LYMPH:  not examined BREAST:not examined LUNGS: clear to auscultation and percussion HEART: regular rate & rhythm ABDOMEN:abdomen soft and normal bowel sounds BACK: Back symmetric, no curvature. EXTREMITIES:less then 2 second capillary refill, no joint deformities, effusion, or inflammation, no skin discoloration, no cyanosis  NEURO: alert & oriented x 3 with fluent speech, no focal motor/sensory deficits, gait normal   LABORATORY DATA: CBC    Component Value Date/Time   WBC 8.3 07/25/2016 1022   RBC 2.16 (L) 07/25/2016 1022   HGB 7.1 (L) 07/25/2016 1022   HCT 21.7 (L) 07/25/2016 1022   PLT 187 07/25/2016 1022   MCV 100.5 (H) 07/25/2016 1022   MCH 32.9 07/25/2016 1022   MCHC 32.7 07/25/2016 1022   RDW 17.3 (H) 07/25/2016 1022   LYMPHSABS 0.3 (L) 07/25/2016 1022   MONOABS 0.8 07/25/2016 1022   EOSABS 0.1 07/25/2016 1022   BASOSABS 0.0 07/25/2016 1022      Chemistry      Component Value Date/Time   NA 132 (L) 07/25/2016 1022   K 3.6 07/25/2016 1022   CL 97 (L) 07/25/2016 1022   CO2 27 07/25/2016 1022   BUN 9 07/25/2016 1022   CREATININE 0.55 (L) 07/25/2016 1022      Component Value  Date/Time   CALCIUM 8.2 (L) 07/25/2016 1022   ALKPHOS 83 07/25/2016 1022   AST 25 07/25/2016 1022   ALT 31 07/25/2016 1022   BILITOT 0.6 07/25/2016 1022        PENDING LABS:   RADIOGRAPHIC STUDIES:  Ct Thoracic Spine W Contrast  Result Date: 07/02/2016 CLINICAL DATA:  Lung cancer with metastatic disease to the spine. Stereotactic radiosurgery planning for treatment of T4  lesion. FLUOROSCOPY TIME:  Radiation Exposure Index (as provided by the fluoroscopic device): 279.33 microGray*m^2 Fluoroscopy Time (in minutes and seconds):  1 minute 11 seconds PROCEDURE: LUMBAR PUNCTURE FOR THORACIC MYELOGRAM After thorough discussion of risks and benefits of the procedure including bleeding, infection, injury to nerves, blood vessels, adjacent structures as well as headache and CSF leak, written and oral informed consent was obtained. Consent was obtained by Dr. Logan Bores. Patient was positioned prone on the fluoroscopy table. Local anesthesia was provided with 1% lidocaine without epinephrine after prepped and draped in the usual sterile fashion. Puncture was performed at L3-4 using a 3 1/2 inch 22-gauge spinal needle via a right interlaminar approach. Using a single pass through the dura, the needle was placed within the thecal sac, with return of clear CSF. 10 mL of Isovue M 300 was injected into the thecal sac, with normal opacification of the nerve roots and cauda equina consistent with free flow within the subarachnoid space. The patient was then moved to the trendelenburg position and contrast flowed into the Thoracic spine region. I personally performed the lumbar puncture and administered the intrathecal contrast. I also personally supervised acquisition of the myelogram images. TECHNIQUE: Contiguous axial images were obtained through the Thoracic spine after the intrathecal infusion of infusion. Coronal and sagittal reconstructions were obtained of the axial image sets. COMPARISON:  Thoracic spine  MRI 06/26/2016. PET-CT 06/10/2016. Chest CT 04/29/2016. FINDINGS: THORACIC MYELOGRAM FINDINGS: There is minimal left convex curvature of the upper thoracic spine. There is no significant listhesis. A destructive lesion is again seen involving the right aspect of the T4 vertebral body and posterior elements. Assessment of spinal canal patency is deferred to the following CT given poor visualization of contrast on the conventional myelographic images. A left subclavian Port-A-Cath terminates over the SVC. Aortic atherosclerosis is noted. CT THORACIC MYELOGRAM FINDINGS: There is slight left convex curvature of the upper thoracic spine. There is no listhesis. As described on recent MRI, there is a destructive lesion involving the posterior T4 vertebral body, pedicle, transverse and articular processes, and lamina on the right with mild right-sided vertebral body height loss. There is also involvement of the adjacent right fourth rib. The mass measures 3.8 x 3.3 cm (transverse x AP) and has enlarged from the 04/29/2016 chest CT. Small volume tumor is present in the lateral epidural space on the right with slight displacement of the spinal cord but no spinal cord compression. Tumor extends into and narrows the right T4-5 neural foramen. There is also mild right-sided neural foraminal narrowing at T3-4. No suspicious lytic or blastic osseous lesions are identified elsewhere in the thoracic spine. A small left paracentral disc protrusion is again seen at T10-11 resulting in a slight impression on the spinal cord but no significant stenosis. Facet spurring results in mild right neural foraminal narrowing at T10-11. The conus medullaris terminates at L1. Advanced atherosclerosis is noted involving the thoracic and visualized abdominal aorta and its major branch vessels as well as coronary arteries. Mildly ectatic ascending thoracic aorta is unchanged. Left subclavian Port-A-Cath terminates in the SVC. 2.7 cm subcarinal mass  is unchanged. There is centrilobular emphysema. Evaluation of the lung parenchyma is limited by motion artifact, with mixed ground-glass and consolidative opacities present in the right upper and right lower lobes, incompletely visualized although the right upper lobe opacity appears to of increased from the prior PET-CT. IMPRESSION: 1. 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 Small volume epidural tumor on the right without  spinal cord compression. Right T4-5 neural foraminal involvement. 2. Small T10-11 disc protrusion without significant stenosis. 3. Unchanged sub carinal mass. Incompletely evaluated right upper and right lower lobe opacities, with the right upper lobe opacity increasing from the prior PET-CT. 4. Aortic atherosclerosis. Electronically Signed   By: Logan Bores M.D.   On: 07/02/2016 14:31   Mr Jeri Cos XH Contrast  Result Date: 07/21/2016 CLINICAL DATA:  Squamous cell carcinoma lung. Staging for metastatic disease. EXAM: MRI HEAD WITHOUT AND WITH CONTRAST TECHNIQUE: Multiplanar, multiecho pulse sequences of the brain and surrounding structures were obtained without and with intravenous contrast. CONTRAST:  15m MULTIHANCE GADOBENATE DIMEGLUMINE 529 MG/ML IV SOLN COMPARISON:  None. FINDINGS: Ventricle size normal.  Cerebral volume normal for age. Negative for acute infarct. Scattered small nonenhancing white matter lesions bilaterally consistent with chronic microvascular ischemia. Chronic infarct left pons. Chronic micro hemorrhage left temporal lobe. No other areas of intracranial hemorrhage. Atherosclerotic disease in the posterior circulation. Extensive abnormal signal in the distal right vertebral artery which may be occluded. Restricted diffusion in the proximal basilar artery. This may be due to thrombus in an occluded segment which shows non enhancement . Correlate with neurologic symptoms. Postcontrast imaging demonstrates no metastatic deposits. The  leptomeningeal enhancement normal. The dural venous sinus enhancement is normal. Paranasal sinuses clear.  Normal orbital contents Pituitary normal in size No worrisome skull of bony lesion identified. IMPRESSION: Mild chronic microvascular ischemia.  No acute infarct. Negative for metastatic disease Atherosclerotic disease in the posterior circulation. There is abnormal signal in the distal right vertebral artery which may be occluded. There is an area of restricted diffusion and loss of flow void in the proximal basilar which may also be occluded of indeterminate age. Electronically Signed   By: CFranchot GalloM.D.   On: 07/21/2016 14:41   Dg Myelography Lumbar Inj Thoracic  Result Date: 07/02/2016 CLINICAL DATA:  Lung cancer with metastatic disease to the spine. Stereotactic radiosurgery planning for treatment of T4 lesion. FLUOROSCOPY TIME:  Radiation Exposure Index (as provided by the fluoroscopic device): 279.33 microGray*m^2 Fluoroscopy Time (in minutes and seconds):  1 minute 11 seconds PROCEDURE: LUMBAR PUNCTURE FOR THORACIC MYELOGRAM After thorough discussion of risks and benefits of the procedure including bleeding, infection, injury to nerves, blood vessels, adjacent structures as well as headache and CSF leak, written and oral informed consent was obtained. Consent was obtained by Dr. ALogan Bores Patient was positioned prone on the fluoroscopy table. Local anesthesia was provided with 1% lidocaine without epinephrine after prepped and draped in the usual sterile fashion. Puncture was performed at L3-4 using a 3 1/2 inch 22-gauge spinal needle via a right interlaminar approach. Using a single pass through the dura, the needle was placed within the thecal sac, with return of clear CSF. 10 mL of Isovue M 300 was injected into the thecal sac, with normal opacification of the nerve roots and cauda equina consistent with free flow within the subarachnoid space. The patient was then moved to the  trendelenburg position and contrast flowed into the Thoracic spine region. I personally performed the lumbar puncture and administered the intrathecal contrast. I also personally supervised acquisition of the myelogram images. TECHNIQUE: Contiguous axial images were obtained through the Thoracic spine after the intrathecal infusion of infusion. Coronal and sagittal reconstructions were obtained of the axial image sets. COMPARISON:  Thoracic spine MRI 06/26/2016. PET-CT 06/10/2016. Chest CT 04/29/2016. FINDINGS: THORACIC MYELOGRAM FINDINGS: There is minimal left convex curvature of the upper thoracic spine. There  is no significant listhesis. A destructive lesion is again seen involving the right aspect of the T4 vertebral body and posterior elements. Assessment of spinal canal patency is deferred to the following CT given poor visualization of contrast on the conventional myelographic images. A left subclavian Port-A-Cath terminates over the SVC. Aortic atherosclerosis is noted. CT THORACIC MYELOGRAM FINDINGS: There is slight left convex curvature of the upper thoracic spine. There is no listhesis. As described on recent MRI, there is a destructive lesion involving the posterior T4 vertebral body, pedicle, transverse and articular processes, and lamina on the right with mild right-sided vertebral body height loss. There is also involvement of the adjacent right fourth rib. The mass measures 3.8 x 3.3 cm (transverse x AP) and has enlarged from the 04/29/2016 chest CT. Small volume tumor is present in the lateral epidural space on the right with slight displacement of the spinal cord but no spinal cord compression. Tumor extends into and narrows the right T4-5 neural foramen. There is also mild right-sided neural foraminal narrowing at T3-4. No suspicious lytic or blastic osseous lesions are identified elsewhere in the thoracic spine. A small left paracentral disc protrusion is again seen at T10-11 resulting in a  slight impression on the spinal cord but no significant stenosis. Facet spurring results in mild right neural foraminal narrowing at T10-11. The conus medullaris terminates at L1. Advanced atherosclerosis is noted involving the thoracic and visualized abdominal aorta and its major branch vessels as well as coronary arteries. Mildly ectatic ascending thoracic aorta is unchanged. Left subclavian Port-A-Cath terminates in the SVC. 2.7 cm subcarinal mass is unchanged. There is centrilobular emphysema. Evaluation of the lung parenchyma is limited by motion artifact, with mixed ground-glass and consolidative opacities present in the right upper and right lower lobes, incompletely visualized although the right upper lobe opacity appears to of increased from the prior PET-CT. IMPRESSION: 1. 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 Small volume epidural tumor on the right without spinal cord compression. Right T4-5 neural foraminal involvement. 2. Small T10-11 disc protrusion without significant stenosis. 3. Unchanged sub carinal mass. Incompletely evaluated right upper and right lower lobe opacities, with the right upper lobe opacity increasing from the prior PET-CT. 4. Aortic atherosclerosis. Electronically Signed   By: Logan Bores M.D.   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  He was treated with adjuvant systemic chemotherapy consisting of Carboplatin/Abraxane beginning on 03/07/2016, but with progression of disease noted on CT imaging on 05/30/2016 requiring a change in therapy to [place] beginning on 06/13/2016.  PDL1- proportion score of 50- 60% (high expression)  Oncology  history is updated.  Pre-treatment labs as ordered today: CBC diff, CMET, TSH.  I personally reviewed and went over laboratory results with the patient.  The results are noted within this dictation.  Treatment criteria satisfied today. Hemoglobin is noted to be 7.1 g/dL. In the presence of his sister, we discussed the role of PRBC transfusions.  He is agreeable to 2 unit PRBC and this is scheduled for Monday.  Anemia panel is added to labs today.  He continues with a cough that is productive of green sputum.  He denies any fevers  at home or shaking chills.  He recently completed a Z-Pak with minimal improved.  Rx is escribed for Levaquin.  I personally reviewed and went over radiographic studies with the patient.  The results are noted within this dictation.  MRI of brain confirms no metastatic disease intracranially.  Today is cycle #3 and according to PI, restaging images are recommended at week #9.  Order is placed for CT CAP with contrast for restaging purposes in ~ 2 weeks.  Return in 3 weeks for treatment and follow-up appointment with continuation of chemotherapy as planned.     ORDERS PLACED FOR THIS ENCOUNTER: Orders Placed This Encounter  Procedures  . CT Abdomen Pelvis W Contrast  . CT Chest W Contrast  . Vitamin B12  . Folate  . Iron and TIBC  . Ferritin  . Practitioner attestation of consent  . Complete patient signature process for consent form  . Care order/instruction  . Type and screen  . ABO/Rh    MEDICATIONS PRESCRIBED THIS ENCOUNTER: Meds ordered this encounter  Medications  . ondansetron (ZOFRAN) 8 MG tablet  . predniSONE (DELTASONE) 20 MG tablet  . levofloxacin (LEVAQUIN) 500 MG tablet    Sig: Take 1 tablet (500 mg total) by mouth daily.    Dispense:  14 tablet    Refill:  0    Order Specific Question:   Supervising Provider    Answer:   Patrici Ranks [id number]    THERAPY PLAN:  Continue with palliative treatment as planned.  All questions  were answered. The patient knows to call the clinic with any problems, questions or concerns. We can certainly see the patient much sooner if necessary.  Patient and plan discussed with Dr. Ancil Linsey and she is in agreement with the aforementioned.   This note is electronically signed by: Doy Mince 07/27/2016 9:03 PM

## 2016-07-25 NOTE — Progress Notes (Signed)
  Radiation Oncology         [phone] ________________________________  Name: Tyler Aguilar MRN: [id number]  Date: 07/04/2016  DOB: 1956/05/12  End of Treatment Note  Diagnosis:      ICD-9-CM ICD-10-CM   1. Bone metastasis ([place]) 198.5 C79.51        Indication for treatment:  Palliative       Radiation treatment dates:  06/23/2016 to 07/04/2016  Site/dose:    1. The Left iliac wing was treated to 30 Gy in 10 fractions at 3 Gy per fraction. 2. The Left femur was treated to 30 Gy in 10 fractions at 3 Gy per fraction. 3. The spine was treated to 18 Gy in 1 fraction.  Beams/energy:    1. 3D // 10X, 6X 2. Isodose plan // 15X 3. SBRT/SRT-VMAT // 10FFF  Narrative: The patient tolerated radiation treatment relatively well.   He did not have any difficulties with treatment. He reported no GI issues.   Plan: The patient has completed radiation treatment. The patient will return to radiation oncology clinic for routine followup in one month. I advised them to call or return sooner if they have any questions or concerns related to their recovery or treatment.  ------------------------------------------------  Jodelle Gross, MD, PhD  This document serves as a record of services personally performed by Kyung Rudd, MD. It was created on his behalf by Arlyce Harman, a trained medical scribe. The creation of this record is based on the scribe's personal observations and the provider's statements to them. This document has been checked and approved by the attending provider.

## 2016-07-25 NOTE — Progress Notes (Signed)
Tyler Aguilar's labs were shown to [employer] PA including HGB 7.1 Chemo was given today and blood transfusion will be given on Monday per orders from PA. Pt tolerated chemo tx well without issues. Pt discharged self ambulatory in satisfactory condition with sister

## 2016-07-27 NOTE — Assessment & Plan Note (Addendum)
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  He was treated with adjuvant systemic chemotherapy consisting of Carboplatin/Abraxane beginning on 03/07/2016, but with progression of disease noted on CT imaging on 05/30/2016 requiring a change in therapy to [place] beginning on 06/13/2016.  PDL1- proportion score of 50- 60% (high expression)  Oncology history is updated.  Pre-treatment labs as ordered today: CBC diff, CMET, TSH.  I personally reviewed and went over laboratory results with the patient.  The results are noted within this dictation.  Treatment criteria satisfied today. Hemoglobin is noted to be 7.1 g/dL. In the presence of his sister, we discussed the role of PRBC transfusions.  He is agreeable to 2 unit PRBC and this is scheduled for Monday.  Anemia panel is added to labs today.  He continues with a cough that is productive of green sputum.  He denies any fevers at home or shaking chills.  He recently completed a Z-Pak with minimal improved.  Rx is escribed for Levaquin.  I personally reviewed and went over radiographic studies with the patient.  The results are noted within this dictation.  MRI of brain confirms no metastatic disease intracranially.  Today is cycle #3 and according to PI, restaging images are recommended at week #9.  Order is placed for CT CAP with contrast for restaging purposes in ~ 2 weeks.  Return in 3 weeks for treatment and follow-up appointment with continuation of chemotherapy as planned.

## 2016-07-28 ENCOUNTER — Encounter: Payer: Self-pay | Admitting: Adult Health

## 2016-07-28 ENCOUNTER — Encounter (HOSPITAL_BASED_OUTPATIENT_CLINIC_OR_DEPARTMENT_OTHER): Payer: Medicare PPO

## 2016-07-28 ENCOUNTER — Ambulatory Visit (INDEPENDENT_AMBULATORY_CARE_PROVIDER_SITE_OTHER): Payer: Medicare PPO | Admitting: Adult Health

## 2016-07-28 VITALS — BP 106/64 | HR 67 | Ht 65.0 in | Wt 128.0 lb

## 2016-07-28 VITALS — BP 98/79 | HR 78 | Temp 97.7°F | Resp 18 | Wt 127.2 lb

## 2016-07-28 DIAGNOSIS — D6481 Anemia due to antineoplastic chemotherapy: Secondary | ICD-10-CM | POA: Diagnosis not present

## 2016-07-28 DIAGNOSIS — C3490 Malignant neoplasm of unspecified part of unspecified bronchus or lung: Secondary | ICD-10-CM

## 2016-07-28 DIAGNOSIS — D630 Anemia in neoplastic disease: Secondary | ICD-10-CM | POA: Diagnosis not present

## 2016-07-28 DIAGNOSIS — T451X5A Adverse effect of antineoplastic and immunosuppressive drugs, initial encounter: Secondary | ICD-10-CM

## 2016-07-28 DIAGNOSIS — I482 Chronic atrial fibrillation, unspecified: Secondary | ICD-10-CM

## 2016-07-28 DIAGNOSIS — I255 Ischemic cardiomyopathy: Secondary | ICD-10-CM

## 2016-07-28 DIAGNOSIS — C3491 Malignant neoplasm of unspecified part of right bronchus or lung: Secondary | ICD-10-CM | POA: Diagnosis not present

## 2016-07-28 DIAGNOSIS — I739 Peripheral vascular disease, unspecified: Secondary | ICD-10-CM

## 2016-07-28 DIAGNOSIS — I779 Disorder of arteries and arterioles, unspecified: Secondary | ICD-10-CM

## 2016-07-28 LAB — VITAMIN B12: Vitamin B-12: 690 pg/mL (ref 180–914)

## 2016-07-28 LAB — IRON AND TIBC
IRON: 24 ug/dL — AB (ref 45–182)
Saturation Ratios: 18 % (ref 17.9–39.5)
TIBC: 133 ug/dL — ABNORMAL LOW (ref 250–450)
UIBC: 109 ug/dL

## 2016-07-28 LAB — FERRITIN: FERRITIN: 1267 ng/mL — AB (ref 24–336)

## 2016-07-28 LAB — FOLATE: FOLATE: 21.9 ng/mL (ref >5.9)

## 2016-07-28 MED ORDER — SODIUM CHLORIDE 0.9% FLUSH
10.0000 mL | INTRAVENOUS | Status: AC | PRN
  Administered 2016-07-28: 10 mL

## 2016-07-28 MED ORDER — ACETAMINOPHEN 325 MG PO TABS
650.0000 mg | ORAL_TABLET | Freq: Once | ORAL | Status: AC
  Administered 2016-07-28: 650 mg via ORAL

## 2016-07-28 MED ORDER — DIPHENHYDRAMINE HCL 25 MG PO CAPS
25.0000 mg | ORAL_CAPSULE | Freq: Once | ORAL | Status: AC
  Administered 2016-07-28: 25 mg via ORAL

## 2016-07-28 MED ORDER — HEPARIN SOD (PORK) LOCK FLUSH 100 UNIT/ML IV SOLN
500.0000 [IU] | Freq: Every day | INTRAVENOUS | Status: AC | PRN
  Administered 2016-07-28: 500 [IU]
  Filled 2016-07-28: qty 5

## 2016-07-28 MED ORDER — DIPHENHYDRAMINE HCL 25 MG PO CAPS
ORAL_CAPSULE | ORAL | Status: AC
  Filled 2016-07-28: qty 1

## 2016-07-28 MED ORDER — SODIUM CHLORIDE 0.9 % IV SOLN
250.0000 mL | Freq: Once | INTRAVENOUS | Status: AC
  Administered 2016-07-28: 250 mL via INTRAVENOUS

## 2016-07-28 MED ORDER — ACETAMINOPHEN 325 MG PO TABS
ORAL_TABLET | ORAL | Status: AC
  Filled 2016-07-28: qty 2

## 2016-07-28 NOTE — Patient Instructions (Signed)
[place] at [facility] Discharge Instructions  RECOMMENDATIONS MADE BY THE CONSULTANT AND ANY TEST RESULTS WILL BE SENT TO YOUR REFERRING PHYSICIAN.  Two units of blood today.    Thank you for choosing [place] at [facility] to provide your oncology and hematology care.  To afford each patient quality time with our provider, please arrive at least 15 minutes before your scheduled appointment time.   Beginning January 23rd 2017 lab work for the [facility] will be done in the  Main lab at [facility] on 1st floor. If you have a lab appointment with the [facility] please come in thru the  Main Entrance and check in at the main information desk  You need to re-schedule your appointment should you arrive 10 or more minutes late.  We strive to give you quality time with our providers, and arriving late affects you and other patients whose appointments are after yours.  Also, if you no show three or more times for appointments you may be dismissed from the clinic at the providers discretion.     Again, thank you for choosing [facility].  Our hope is that these requests will decrease the amount of time that you wait before being seen by our physicians.       _____________________________________________________________  Should you have questions after your visit to [facility], please contact our office at [phone] between the hours of 8:30 a.m. and 4:30 p.m.  Voicemails left after 4:30 p.m. will not be returned until the following business day.  For prescription refill requests, have your pharmacy contact our office.         Resources For Cancer Patients and their Caregivers ? American Cancer Society: Can assist with transportation, wigs, general needs, runs Look Good Feel Better.        (587)658-6466 ? Cancer Care: Provides financial assistance, online support groups, medication/co-pay assistance.   1-800-813-HOPE (718) 264-8043) ? [place] Assists [place] cancer patients and their families through emotional , educational and financial support.  669-465-0060 ? [place] DSS Where to apply for food stamps, Medicaid and utility assistance. (539)217-8110 ? [facility]: Transportation to medical appointments. (410)016-7471 ? Social Security Administration: May apply for disability if have a Stage IV cancer. (819) 703-3569 (479)846-5900 ? [employer], Disability and Transit Services: Assists with nutrition, care and transit needs. [place] Support Programs: '@10RELATIVEDAYS'$ @ > Cancer Support Group  2nd Tuesday of the month 1pm-2pm, Journey Room  > Creative Journey  3rd Tuesday of the month 1130am-1pm, Journey Room  > Look Good Feel Better  1st Wednesday of the month 10am-12 noon, Journey Room (Call [place] to register [phone])

## 2016-07-28 NOTE — Patient Instructions (Signed)
Your physician wants you to follow-up in: 6 Months with Dr. Branch. You will receive a reminder letter in the mail two months in advance. If you don't receive a letter, please call our office to schedule the follow-up appointment.  Your physician recommends that you continue on your current medications as directed. Please refer to the Current Medication list given to you today.  If you need a refill on your cardiac medications before your next appointment, please call your pharmacy.  Thank you for choosing [facility]!   

## 2016-07-28 NOTE — Progress Notes (Signed)
Patient tolerated transfusion well.  VSS throughout.  Education provided about the reason for the blood as well as the administration and possible reactions.  Patient verbalized understanding and signed consent without any further questions.

## 2016-07-28 NOTE — Progress Notes (Signed)
Cardiology Office Note   Date:  07/28/2016   ID:  DAQUON GREENLEAF, DOB June 01, 1956, MRN [id number]  PCP:  [name], MD  Cardiologist: Cloria Spring, NP   Chief Complaint  Patient presents with  . Hypertension  . Atrial Fibrillation  . PAD  . Cardiomyopathy      History of Present Illness: Tyler Aguilar is a 60 y.o. male who presents for ongoing assessment and management of chronic systolic heart failure, with most recent echocardiogram in February of 2017 at [place] with EF of 35%, this was in the setting of rapid atrial  fibrillation and acute respiratory illness. A followup echocardiogram in March of 2017 showed LVEF of 35-40% with multiple wall motion abnormalities. He was placed on lisinopril 2.5 mg daily but had a cough. He was continued on a temporarily to see if the cough went away on its own if continued they were to change to losartan. He was continued on XARELTO with a CHADS VASC Score of 3. He complained of that being expensive and ELIQUIS was considered.  He has since been changed to [place]. He is receiving blood transfusions in the setting of anemia related to chemotherapy and radiation for lung cancer with bone metastasis. He is being followed at [facility] and by the cancer center at [facility].   He denies symptoms currently.  Past Medical History:  Diagnosis Date  . AAA (abdominal aortic aneurysm) (HCC)    4.5 cm December 2016  . Cardiomyopathy ([place])    LVEF 35% February 2017 [facility]  . COPD (chronic obstructive pulmonary disease) ([place])   . Dyslipidemia   . Lung cancer ([place])    Stage III squamous cell, right lung  . PAF (paroxysmal atrial fibrillation) (HCC)    Versus SVT documented February 2017 [facility]  . 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 BRONCHOSCOPY    . COLONOSCOPY  08/31/2012   Procedure:  COLONOSCOPY;  Surgeon: Jamesetta So, MD;  Location: [facility] ENDO SUITE;  Service: Gastroenterology;  Laterality: N/A;  . FINGER SURGERY Right 1979  . PORTACATH PLACEMENT Left 02/27/2016   Procedure: INSERTION PORT-A-CATH LEFT SUBCLAVIAN;  Surgeon: Aviva Signs, MD;  Location: [facility] ORS;  Service: General;  Laterality: Left;     Current Outpatient Prescriptions  Medication Sig Dispense Refill  . aspirin 81 MG tablet Take 81 mg by mouth daily.    Marland Kitchen atorvastatin (LIPITOR) 80 MG tablet Take 80 mg by mouth every evening.    Marland Kitchen azithromycin (ZITHROMAX) 250 MG tablet As directed 6 each 0  . budesonide-formoterol (SYMBICORT) 160-4.5 MCG/ACT inhaler Inhale 2 puffs into the lungs 2 (two) times daily.    Marland Kitchen CARBOPLATIN IV Inject into the vein. Reported on 06/16/2016    . carvedilol (COREG) 3.125 MG tablet Take 1 tablet (3.125 mg total) by mouth 2 (two) times daily. 180 tablet 3  . cholecalciferol (VITAMIN D) 1000 units tablet Take 1,000 Units by mouth daily.    Marland Kitchen escitalopram (LEXAPRO) 10 MG tablet Take 1 tablet (10 mg total) by mouth daily. 90 tablet 1  . fentaNYL (DURAGESIC - DOSED MCG/HR) 25 MCG/HR patch Place 1 patch (25 mcg total) onto the skin every 3 (three) days. 10 patch 0  . levofloxacin (LEVAQUIN) 500 MG tablet Take 1 tablet (500 mg total) by mouth daily. 14 tablet 0  . lidocaine-prilocaine (EMLA)  cream Apply a quarter size amount to port site 1 hour prior to chemo. Do not rub in. Cover with plastic wrap. 30 g 3  . LORazepam (ATIVAN) 1 MG tablet Take 1 tablet (1 mg total) by mouth 3 (three) times daily as needed for anxiety. 60 tablet 0  . losartan (COZAAR) 25 MG tablet Take 0.5 tablets (12.5 mg total) by mouth daily. 45 tablet 3  . ondansetron (ZOFRAN) 8 MG tablet     . oxyCODONE (ROXICODONE) 5 MG immediate release tablet Take 1 tablet (5 mg total) by mouth every 6 (six) hours as needed for severe pain. 60 tablet 0  . pantoprazole (PROTONIX) 40 MG tablet Take 1 tablet (40 mg total) by mouth daily. 90  tablet 1  . predniSONE (DELTASONE) 20 MG tablet     . pyridOXINE (VITAMIN B-6) 50 MG tablet Take 50 mg by mouth daily.    . ranitidine (ZANTAC) 150 MG tablet Take 1 tablet (150 mg total) by mouth 2 (two) times daily. 180 tablet 1  . rivaroxaban (XARELTO) 20 MG TABS tablet Take 1 tablet (20 mg total) by mouth daily. 90 tablet 2  . Spacer/Aero-Holding Chambers (AEROCHAMBER PLUS FLO-VU) MISC     . SPIRIVA RESPIMAT 2.5 MCG/ACT AERS INHALE 2 PUFFS INTO LUNGS DAILY 1 Inhaler 3   No current facility-administered medications for this visit.     Allergies:   Review of patient's allergies indicates no known allergies.    Social History:  The patient  reports that he quit smoking about 6 months ago. His smoking use included Cigarettes. He has quit using smokeless tobacco. He reports that he drinks alcohol. He reports that he does not use drugs.   Family History:  The patient's family history includes Cancer in his father and mother; Coronary artery disease in his brother; Diabetes Mellitus II in his mother.    ROS: All other systems are reviewed and negative. Unless otherwise mentioned in H&P    PHYSICAL EXAM: VS:  BP 106/64   Pulse 67   Ht '5\' 5"'$  (1.651 m)   Wt 128 lb (58.1 kg)   SpO2 98%   BMI 21.30 kg/m  , BMI Body mass index is 21.3 kg/m. GEN: Well nourished, well developed, in no acute distress  HEENT: normal  Neck: no JVD, left carotid bruit, or masses Cardiac: RRR; no murmurs, rubs, or gallops,no edema  Respiratory:  Clear to auscultation bilaterally, normal work of breathing GI: soft, nontender, nondistended, + BS MS: no deformity or atrophy  Skin: warm and dry, no rash Neuro:  Strength and sensation are intact Psych: euthymic mood, full affect  Recent Labs: 07/25/2016: ALT 31; BUN 9; Creatinine, Ser 0.55; Hemoglobin 7.1; Platelets 187; Potassium 3.6; Sodium 132; TSH 1.260    Lipid Panel No results found for: CHOL, TRIG, HDL, CHOLHDL, VLDL, LDLCALC, LDLDIRECT    Wt  Readings from Last 3 Encounters:  07/28/16 128 lb (58.1 kg)  07/28/16 127 lb 3.2 oz (57.7 kg)  07/25/16 127 lb 9.6 oz (57.9 kg)     ASSESSMENT AND PLAN:  1.  Chronic atrial fibrillation: Heart rate is well controlled, he is tolerating carvedilol and XARELTO. No changes in medication regimen at this time.  2. Cardiomyopathy:most recent ejection fraction 30-35%. He is not currently on diuretic. He has no lower study edema or evidence of decompensation. He remains on carvedilol and ARB.  3. Carotid artery disease: Left carotid bruit is auscultated. He is being followed by [facility]. They are steadying  him annually.   Current medicines are reviewed at length with the patient today.    Labs/ tests ordered today include:  No orders of the defined types were placed in this encounter.    Disposition:   FU with  6 months Signed, Jory Sims, NP  07/28/2016 3:38 PM    [facility] [address], [place], [place] [address] Phone: [phone]; Fax: [phone]

## 2016-07-28 NOTE — Progress Notes (Signed)
Name: Tyler Aguilar    DOB: 1956/05/26  Age: 60 y.o.  MR#: [id number]       PCP:  [name], MD      Insurance: Payor: [employer] MEDICARE / Plan: [employer] MEDICARE CHOICE PPO / Product Type: *No Product type* /   CC:   No chief complaint on file.   VS Vitals:   07/28/16 1501  BP: 106/64  Pulse: 67  SpO2: 98%  Weight: 128 lb (58.1 kg)  Height: 5' 5"  (1.651 m)    Weights Current Weight  07/28/16 128 lb (58.1 kg)  07/28/16 127 lb 3.2 oz (57.7 kg)  07/25/16 127 lb 9.6 oz (57.9 kg)    Blood Pressure  BP Readings from Last 3 Encounters:  07/28/16 106/64  07/28/16 98/79  07/25/16 99/65     Admit date:  (Not on file) Last encounter with RMR:  Visit date not found   Allergy Review of patient's allergies indicates no known allergies.  Current Outpatient Prescriptions  Medication Sig Dispense Refill  . aspirin 81 MG tablet Take 81 mg by mouth daily.    Marland Kitchen atorvastatin (LIPITOR) 80 MG tablet Take 80 mg by mouth every evening.    Marland Kitchen azithromycin (ZITHROMAX) 250 MG tablet As directed 6 each 0  . budesonide-formoterol (SYMBICORT) 160-4.5 MCG/ACT inhaler Inhale 2 puffs into the lungs 2 (two) times daily.    Marland Kitchen CARBOPLATIN IV Inject into the vein. Reported on 06/16/2016    . carvedilol (COREG) 3.125 MG tablet Take 1 tablet (3.125 mg total) by mouth 2 (two) times daily. 180 tablet 3  . cholecalciferol (VITAMIN D) 1000 units tablet Take 1,000 Units by mouth daily.    Marland Kitchen escitalopram (LEXAPRO) 10 MG tablet Take 1 tablet (10 mg total) by mouth daily. 90 tablet 1  . fentaNYL (DURAGESIC - DOSED MCG/HR) 25 MCG/HR patch Place 1 patch (25 mcg total) onto the skin every 3 (three) days. 10 patch 0  . levofloxacin (LEVAQUIN) 500 MG tablet Take 1 tablet (500 mg total) by mouth daily. 14 tablet 0  . lidocaine-prilocaine (EMLA) cream Apply a quarter size amount to port site 1 hour prior to chemo. Do not rub in. Cover with plastic wrap. 30 g 3  . LORazepam (ATIVAN) 1 MG tablet Take 1 tablet (1 mg total) by  mouth 3 (three) times daily as needed for anxiety. 60 tablet 0  . losartan (COZAAR) 25 MG tablet Take 0.5 tablets (12.5 mg total) by mouth daily. 45 tablet 3  . ondansetron (ZOFRAN) 8 MG tablet     . oxyCODONE (ROXICODONE) 5 MG immediate release tablet Take 1 tablet (5 mg total) by mouth every 6 (six) hours as needed for severe pain. 60 tablet 0  . pantoprazole (PROTONIX) 40 MG tablet Take 1 tablet (40 mg total) by mouth daily. 90 tablet 1  . predniSONE (DELTASONE) 20 MG tablet     . pyridOXINE (VITAMIN B-6) 50 MG tablet Take 50 mg by mouth daily.    . ranitidine (ZANTAC) 150 MG tablet Take 1 tablet (150 mg total) by mouth 2 (two) times daily. 180 tablet 1  . rivaroxaban (XARELTO) 20 MG TABS tablet Take 1 tablet (20 mg total) by mouth daily. 90 tablet 2  . Spacer/Aero-Holding Chambers (AEROCHAMBER PLUS FLO-VU) MISC     . SPIRIVA RESPIMAT 2.5 MCG/ACT AERS INHALE 2 PUFFS INTO LUNGS DAILY 1 Inhaler 3   No current facility-administered medications for this visit.     Discontinued Meds:    Medications Discontinued  During This Encounter  Medication Reason  . fentaNYL (DURAGESIC - DOSED MCG/HR) 12 MCG/HR Error    Patient Active Problem List   Diagnosis Date Noted  . Bone metastasis ([place]) 06/28/2016  . Hiccups 03/10/2016  . Squamous cell carcinoma of lung, stage IV ([place]) 02/12/2016  . Physical deconditioning 02/12/2016  . Persistent atrial fibrillation ([place]) 02/12/2016  . Squamous cell carcinoma of lung ([place]) 01/09/2016  . COPD (chronic obstructive pulmonary disease) ([place]) 01/03/2016  . Acute respiratory failure with hypoxia ([place]) 01/03/2016  . Elevated troponin 01/03/2016  . Chronic obstructive pulmonary disease with acute exacerbation ([place]) 01/03/2016  . Mediastinal mass 01/03/2016  . History of stroke 01/03/2016  . Acute respiratory failure ([place]) 01/03/2016  . PVD (peripheral vascular disease) ([place]) 01/03/2016  . Smoker 01/03/2016  . Dyslipidemia 01/03/2016  . RBBB 01/03/2016  .  Tremor 06/05/2015  . Carotid artery narrowing 05/04/2015  . Current tobacco use 04/28/2014  . Atherosclerosis of native artery of extremity ([place]) 04/28/2014  . Abdominal aortic aneurysm (AAA) without rupture ([place]) 04/28/2014  . Cerebrovascular accident (CVA) ([place]) 04/20/2013  . Spasm 04/20/2013    LABS    Component Value Date/Time   NA 132 (L) 07/25/2016 1022   NA 133 (L) 07/04/2016 0905   NA 135 06/13/2016 1118   K 3.6 07/25/2016 1022   K 3.7 07/04/2016 0905   K 3.8 06/13/2016 1118   CL 97 (L) 07/25/2016 1022   CL 100 (L) 07/04/2016 0905   CL 100 (L) 06/13/2016 1118   CO2 27 07/25/2016 1022   CO2 27 07/04/2016 0905   CO2 27 06/13/2016 1118   GLUCOSE 94 07/25/2016 1022   GLUCOSE 101 (H) 07/04/2016 0905   GLUCOSE 139 (H) 06/13/2016 1118   BUN 9 07/25/2016 1022   BUN 10 07/04/2016 0905   BUN 9 06/13/2016 1118   CREATININE 0.55 (L) 07/25/2016 1022   CREATININE 0.59 (L) 07/04/2016 0905   CREATININE 0.59 (L) 06/13/2016 1118   CALCIUM 8.2 (L) 07/25/2016 1022   CALCIUM 9.0 07/04/2016 0905   CALCIUM 9.5 06/13/2016 1118   GFRNONAA >60 07/25/2016 1022   GFRNONAA >60 07/04/2016 0905   GFRNONAA >60 06/13/2016 1118   GFRAA >60 07/25/2016 1022   GFRAA >60 07/04/2016 0905   GFRAA >60 06/13/2016 1118   CMP     Component Value Date/Time   NA 132 (L) 07/25/2016 1022   K 3.6 07/25/2016 1022   CL 97 (L) 07/25/2016 1022   CO2 27 07/25/2016 1022   GLUCOSE 94 07/25/2016 1022   BUN 9 07/25/2016 1022   CREATININE 0.55 (L) 07/25/2016 1022   CALCIUM 8.2 (L) 07/25/2016 1022   PROT 6.4 (L) 07/25/2016 1022   ALBUMIN 2.2 (L) 07/25/2016 1022   AST 25 07/25/2016 1022   ALT 31 07/25/2016 1022   ALKPHOS 83 07/25/2016 1022   BILITOT 0.6 07/25/2016 1022   GFRNONAA >60 07/25/2016 1022   GFRAA >60 07/25/2016 1022       Component Value Date/Time   WBC 8.3 07/25/2016 1022   WBC 5.3 07/04/2016 0905   WBC 4.1 06/13/2016 1118   HGB 7.1 (L) 07/25/2016 1022   HGB 8.2 (L) 07/04/2016 0905    HGB 8.7 (L) 06/13/2016 1118   HCT 21.7 (L) 07/25/2016 1022   HCT 25.4 (L) 07/04/2016 0905   HCT 26.5 (L) 06/13/2016 1118   MCV 100.5 (H) 07/25/2016 1022   MCV 101.6 (H) 07/04/2016 0905   MCV 102.7 (H) 06/13/2016 1118    Lipid Panel  No results found for: CHOL, TRIG, HDL, CHOLHDL, VLDL, LDLCALC, LDLDIRECT  ABG    Component Value Date/Time   PHART 7.405 01/11/2016 0300   PCO2ART 41.7 01/11/2016 0300   PO2ART 70.4 (L) 01/11/2016 0300   HCO3 25.4 (H) 01/11/2016 0300   TCO2 18.6 01/11/2016 0300   O2SAT 93.3 01/11/2016 0300     Lab Results  Component Value Date   TSH 1.260 07/25/2016   BNP (last 3 results) No results for input(s): BNP in the last 8760 hours.  ProBNP (last 3 results) No results for input(s): PROBNP in the last 8760 hours.  Cardiac Panel (last 3 results) No results for input(s): CKTOTAL, CKMB, TROPONINI, RELINDX in the last 72 hours.  Iron/TIBC/Ferritin/ %Sat    Component Value Date/Time   IRON 24 (L) 03/14/2016 1006   TIBC 217 (L) 03/14/2016 1006   FERRITIN 395 (H) 03/14/2016 1006   IRONPCTSAT 11 (L) 03/14/2016 1006     EKG Orders placed or performed in visit on 07/04/16  . EKG 12-Lead     Prior Assessment and Plan Problem List as of 07/28/2016 Reviewed: 07/28/2016 12:02 PM by Josephina Shih, RN     Cardiovascular and Mediastinum   PVD (peripheral vascular disease) ([place])   RBBB   Persistent atrial fibrillation ([place])   Cerebrovascular accident (CVA) ([place])   Atherosclerosis of native artery of extremity ([place])   Carotid artery narrowing   Abdominal aortic aneurysm (AAA) without rupture ([place])     Respiratory   COPD (chronic obstructive pulmonary disease) ([place])   Acute respiratory failure with hypoxia ([place])   Chronic obstructive pulmonary disease with acute exacerbation ([place])   Acute respiratory failure ([place])   Squamous cell carcinoma of lung, stage IV [facility])   Last Assessment & Plan 07/25/2016 Office Visit Edited 07/27/2016  9:00 PM by Baird Cancer, PA-C    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  He was treated with adjuvant systemic chemotherapy consisting of Carboplatin/Abraxane beginning on 03/07/2016, but with progression of disease noted on CT imaging on 05/30/2016 requiring a change in therapy to [place] beginning on 06/13/2016.  PDL1- proportion score of 50- 60% (high expression)  Oncology history is updated.  Pre-treatment labs as ordered today: CBC diff, CMET, TSH.  I personally reviewed and went over laboratory results with the patient.  The results are noted within this dictation.  Treatment criteria satisfied today. Hemoglobin is noted to be 7.1 g/dL. In the presence of his sister, we discussed the role of PRBC transfusions.  He is agreeable to 2 unit PRBC and this is scheduled for Monday.  Anemia panel is added to labs today.  He continues with a cough that is productive of green sputum.  He denies any fevers at home or shaking chills.  He recently completed a Z-Pak with minimal improved.  Rx is escribed for Levaquin.  I personally reviewed and went over radiographic studies with the patient.  The results are noted within this dictation.  MRI of brain confirms no metastatic disease intracranially.  Today is cycle #3 and according to PI, restaging images are recommended at week #9.  Order is placed for CT CAP with contrast for restaging purposes in ~ 2 weeks.  Return in 3 weeks for treatment and follow-up appointment with continuation of chemotherapy as planned.  Squamous cell carcinoma of lung (HCC)     Musculoskeletal and Integument   Bone metastasis (HCC)     Other   Elevated troponin   Mediastinal mass   History of stroke   Smoker   Dyslipidemia   Physical deconditioning   Hiccups   Tremor   Current  tobacco use   Spasm       Imaging: Ct Thoracic Spine W Contrast  Result Date: 07/02/2016 CLINICAL DATA:  Lung cancer with metastatic disease to the spine. Stereotactic radiosurgery planning for treatment of T4 lesion. FLUOROSCOPY TIME:  Radiation Exposure Index (as provided by the fluoroscopic device): 279.33 microGray*m^2 Fluoroscopy Time (in minutes and seconds):  1 minute 11 seconds PROCEDURE: LUMBAR PUNCTURE FOR THORACIC MYELOGRAM After thorough discussion of risks and benefits of the procedure including bleeding, infection, injury to nerves, blood vessels, adjacent structures as well as headache and CSF leak, written and oral informed consent was obtained. Consent was obtained by Dr. Logan Bores. Patient was positioned prone on the fluoroscopy table. Local anesthesia was provided with 1% lidocaine without epinephrine after prepped and draped in the usual sterile fashion. Puncture was performed at L3-4 using a 3 1/2 inch 22-gauge spinal needle via a right interlaminar approach. Using a single pass through the dura, the needle was placed within the thecal sac, with return of clear CSF. 10 mL of Isovue M 300 was injected into the thecal sac, with normal opacification of the nerve roots and cauda equina consistent with free flow within the subarachnoid space. The patient was then moved to the trendelenburg position and contrast flowed into the Thoracic spine region. I personally performed the lumbar puncture and administered the intrathecal contrast. I also personally supervised acquisition of the myelogram images. TECHNIQUE: Contiguous axial images were obtained through the Thoracic spine after the intrathecal infusion of infusion. Coronal and sagittal reconstructions were obtained of the axial image sets. COMPARISON:  Thoracic spine MRI 06/26/2016. PET-CT 06/10/2016. Chest CT 04/29/2016. FINDINGS: THORACIC MYELOGRAM FINDINGS: There is minimal left convex curvature of the upper thoracic spine. There is no  significant listhesis. A destructive lesion is again seen involving the right aspect of the T4 vertebral body and posterior elements. Assessment of spinal canal patency is deferred to the following CT given poor visualization of contrast on the conventional myelographic images. A left subclavian Port-A-Cath terminates over the SVC. Aortic atherosclerosis is noted. CT THORACIC MYELOGRAM FINDINGS: There is slight left convex curvature of the upper thoracic spine. There is no listhesis. As described on recent MRI, there is a destructive lesion involving the posterior T4 vertebral body, pedicle, transverse and articular processes, and lamina on the right with mild right-sided vertebral body height loss. There is also involvement of the adjacent right fourth rib. The mass measures 3.8 x 3.3 cm (transverse x AP) and has enlarged from the 04/29/2016 chest CT. Small volume tumor is present in the lateral epidural space on the right with slight displacement of the spinal cord but no spinal cord compression. Tumor extends into and narrows the right T4-5 neural foramen. There is also mild right-sided neural foraminal narrowing at T3-4. No suspicious lytic or blastic osseous lesions are identified elsewhere in the thoracic spine. A small left paracentral disc protrusion is again seen at T10-11 resulting in a slight impression on the spinal cord but no significant stenosis. Facet spurring results in mild right neural foraminal narrowing at T10-11. The conus medullaris terminates at L1. Advanced atherosclerosis is noted involving the thoracic and visualized abdominal aorta  and its major branch vessels as well as coronary arteries. Mildly ectatic ascending thoracic aorta is unchanged. Left subclavian Port-A-Cath terminates in the SVC. 2.7 cm subcarinal mass is unchanged. There is centrilobular emphysema. Evaluation of the lung parenchyma is limited by motion artifact, with mixed ground-glass and consolidative opacities present in  the right upper and right lower lobes, incompletely visualized although the right upper lobe opacity appears to of increased from the prior PET-CT. IMPRESSION: 1. 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 Small volume epidural tumor on the right without spinal cord compression. Right T4-5 neural foraminal involvement. 2. Small T10-11 disc protrusion without significant stenosis. 3. Unchanged sub carinal mass. Incompletely evaluated right upper and right lower lobe opacities, with the right upper lobe opacity increasing from the prior PET-CT. 4. Aortic atherosclerosis. Electronically Signed   By: Logan Bores M.D.   On: 07/02/2016 14:31   Mr Jeri Cos OQ Contrast  Result Date: 07/21/2016 CLINICAL DATA:  Squamous cell carcinoma lung. Staging for metastatic disease. EXAM: MRI HEAD WITHOUT AND WITH CONTRAST TECHNIQUE: Multiplanar, multiecho pulse sequences of the brain and surrounding structures were obtained without and with intravenous contrast. CONTRAST:  43m MULTIHANCE GADOBENATE DIMEGLUMINE 529 MG/ML IV SOLN COMPARISON:  None. FINDINGS: Ventricle size normal.  Cerebral volume normal for age. Negative for acute infarct. Scattered small nonenhancing white matter lesions bilaterally consistent with chronic microvascular ischemia. Chronic infarct left pons. Chronic micro hemorrhage left temporal lobe. No other areas of intracranial hemorrhage. Atherosclerotic disease in the posterior circulation. Extensive abnormal signal in the distal right vertebral artery which may be occluded. Restricted diffusion in the proximal basilar artery. This may be due to thrombus in an occluded segment which shows non enhancement . Correlate with neurologic symptoms. Postcontrast imaging demonstrates no metastatic deposits. The leptomeningeal enhancement normal. The dural venous sinus enhancement is normal. Paranasal sinuses clear.  Normal orbital contents Pituitary normal in size No worrisome skull of bony  lesion identified. IMPRESSION: Mild chronic microvascular ischemia.  No acute infarct. Negative for metastatic disease Atherosclerotic disease in the posterior circulation. There is abnormal signal in the distal right vertebral artery which may be occluded. There is an area of restricted diffusion and loss of flow void in the proximal basilar which may also be occluded of indeterminate age. Electronically Signed   By: CFranchot GalloM.D.   On: 07/21/2016 14:41   Dg Myelography Lumbar Inj Thoracic  Result Date: 07/02/2016 CLINICAL DATA:  Lung cancer with metastatic disease to the spine. Stereotactic radiosurgery planning for treatment of T4 lesion. FLUOROSCOPY TIME:  Radiation Exposure Index (as provided by the fluoroscopic device): 279.33 microGray*m^2 Fluoroscopy Time (in minutes and seconds):  1 minute 11 seconds PROCEDURE: LUMBAR PUNCTURE FOR THORACIC MYELOGRAM After thorough discussion of risks and benefits of the procedure including bleeding, infection, injury to nerves, blood vessels, adjacent structures as well as headache and CSF leak, written and oral informed consent was obtained. Consent was obtained by Dr. ALogan Bores Patient was positioned prone on the fluoroscopy table. Local anesthesia was provided with 1% lidocaine without epinephrine after prepped and draped in the usual sterile fashion. Puncture was performed at L3-4 using a 3 1/2 inch 22-gauge spinal needle via a right interlaminar approach. Using a single pass through the dura, the needle was placed within the thecal sac, with return of clear CSF. 10 mL of Isovue M 300 was injected into the thecal sac, with normal opacification of the nerve roots and cauda equina consistent with free flow within  the subarachnoid space. The patient was then moved to the trendelenburg position and contrast flowed into the Thoracic spine region. I personally performed the lumbar puncture and administered the intrathecal contrast. I also personally supervised  acquisition of the myelogram images. TECHNIQUE: Contiguous axial images were obtained through the Thoracic spine after the intrathecal infusion of infusion. Coronal and sagittal reconstructions were obtained of the axial image sets. COMPARISON:  Thoracic spine MRI 06/26/2016. PET-CT 06/10/2016. Chest CT 04/29/2016. FINDINGS: THORACIC MYELOGRAM FINDINGS: There is minimal left convex curvature of the upper thoracic spine. There is no significant listhesis. A destructive lesion is again seen involving the right aspect of the T4 vertebral body and posterior elements. Assessment of spinal canal patency is deferred to the following CT given poor visualization of contrast on the conventional myelographic images. A left subclavian Port-A-Cath terminates over the SVC. Aortic atherosclerosis is noted. CT THORACIC MYELOGRAM FINDINGS: There is slight left convex curvature of the upper thoracic spine. There is no listhesis. As described on recent MRI, there is a destructive lesion involving the posterior T4 vertebral body, pedicle, transverse and articular processes, and lamina on the right with mild right-sided vertebral body height loss. There is also involvement of the adjacent right fourth rib. The mass measures 3.8 x 3.3 cm (transverse x AP) and has enlarged from the 04/29/2016 chest CT. Small volume tumor is present in the lateral epidural space on the right with slight displacement of the spinal cord but no spinal cord compression. Tumor extends into and narrows the right T4-5 neural foramen. There is also mild right-sided neural foraminal narrowing at T3-4. No suspicious lytic or blastic osseous lesions are identified elsewhere in the thoracic spine. A small left paracentral disc protrusion is again seen at T10-11 resulting in a slight impression on the spinal cord but no significant stenosis. Facet spurring results in mild right neural foraminal narrowing at T10-11. The conus medullaris terminates at L1. Advanced  atherosclerosis is noted involving the thoracic and visualized abdominal aorta and its major branch vessels as well as coronary arteries. Mildly ectatic ascending thoracic aorta is unchanged. Left subclavian Port-A-Cath terminates in the SVC. 2.7 cm subcarinal mass is unchanged. There is centrilobular emphysema. Evaluation of the lung parenchyma is limited by motion artifact, with mixed ground-glass and consolidative opacities present in the right upper and right lower lobes, incompletely visualized although the right upper lobe opacity appears to of increased from the prior PET-CT. IMPRESSION: 1. 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 Small volume epidural tumor on the right without spinal cord compression. Right T4-5 neural foraminal involvement. 2. Small T10-11 disc protrusion without significant stenosis. 3. Unchanged sub carinal mass. Incompletely evaluated right upper and right lower lobe opacities, with the right upper lobe opacity increasing from the prior PET-CT. 4. Aortic atherosclerosis. Electronically Signed   By: Logan Bores M.D.   On: 07/02/2016 14:31

## 2016-07-29 LAB — TYPE AND SCREEN
ABO/RH(D): B POS
ANTIBODY SCREEN: NEGATIVE
Unit division: 0
Unit division: 0

## 2016-07-31 ENCOUNTER — Telehealth (HOSPITAL_COMMUNITY): Payer: Self-pay | Admitting: Emergency Medicine

## 2016-07-31 NOTE — Telephone Encounter (Signed)
Pt called stated that he was coughing up blood and clumps of what he thought could have been "parts of the tumor".  Told him that if he thought he needed to come in for blood work I would get him scheduled.  He verbalized understanding.

## 2016-08-03 NOTE — Progress Notes (Signed)
Radiation Oncology         [phone] ________________________________  Name: Tyler Aguilar MRN: [id number]  Date: 08/04/2016  DOB: June 11, 1956  Follow-Up Visit Note  CC: [name], MD  Whitney Muse Kelby Fam, MD  Diagnosis:      ICD-9-CM ICD-10-CM   1. Bone metastasis (HCC) 198.5 C79.51     Interval Since Last Radiation:  1 month    06/23/2016-07/04/2016 1. The Left iliac wing was treated to 30 Gy in 10 fractions at 3 Gy per fraction. 2. The Left femur was treated to 30 Gy in 10 fractions at 3 Gy per fraction. 3. The spine was treated to 18 Gy in 1 fraction.  Narrative:  The patient returns today for routine follow-up.the patient tolerated his radiotherapy well, and comes today for follow-up. He continues to use a fentanyl patch and oxycodone for pain chest wall, and mid thoracic back pain with modest relief but only uses oxycodone once per day. He states that his pain relief this and so, some days are better than others. He denies any shortness of breath or chest pain. He is currently receiving pembrolizumab every 21 days. He denies any fevers or chills, or shortness of breath. He has noticed on 2 occasions hemoptysis, but reports this is less prominent than it had been previously. No other complaints or verbalized.                     ALLERGIES:  has No Known Allergies.  Meds: Current Outpatient Prescriptions  Medication Sig Dispense Refill  . aspirin 81 MG tablet Take 81 mg by mouth daily.    Marland Kitchen atorvastatin (LIPITOR) 80 MG tablet Take 80 mg by mouth every evening.    . budesonide-formoterol (SYMBICORT) 160-4.5 MCG/ACT inhaler Inhale 2 puffs into the lungs 2 (two) times daily.    . carvedilol (COREG) 3.125 MG tablet Take 1 tablet (3.125 mg total) by mouth 2 (two) times daily. 180 tablet 3  . cholecalciferol (VITAMIN D) 1000 units tablet Take 1,000 Units by mouth daily.    Marland Kitchen escitalopram (LEXAPRO) 10 MG tablet Take 1 tablet (10 mg total) by mouth daily. 90 tablet 1  .  fentaNYL (DURAGESIC - DOSED MCG/HR) 25 MCG/HR patch Place 1 patch (25 mcg total) onto the skin every 3 (three) days. 10 patch 0  . levofloxacin (LEVAQUIN) 500 MG tablet Take 1 tablet (500 mg total) by mouth daily. 14 tablet 0  . lidocaine-prilocaine (EMLA) cream Apply a quarter size amount to port site 1 hour prior to chemo. Do not rub in. Cover with plastic wrap. 30 g 3  . LORazepam (ATIVAN) 1 MG tablet Take 1 tablet (1 mg total) by mouth 3 (three) times daily as needed for anxiety. 60 tablet 0  . losartan (COZAAR) 25 MG tablet Take 0.5 tablets (12.5 mg total) by mouth daily. 45 tablet 3  . ondansetron (ZOFRAN) 8 MG tablet Take 8 mg by mouth as needed.     Marland Kitchen oxyCODONE (ROXICODONE) 5 MG immediate release tablet Take 1 tablet (5 mg total) by mouth every 6 (six) hours as needed for severe pain. 60 tablet 0  . pantoprazole (PROTONIX) 40 MG tablet Take 1 tablet (40 mg total) by mouth daily. 90 tablet 1  . pyridOXINE (VITAMIN B-6) 50 MG tablet Take 50 mg by mouth daily.    . ranitidine (ZANTAC) 150 MG tablet Take 1 tablet (150 mg total) by mouth 2 (two) times daily. 180 tablet 1  . rivaroxaban (XARELTO)  20 MG TABS tablet Take 1 tablet (20 mg total) by mouth daily. 90 tablet 2  . Spacer/Aero-Holding Chambers (AEROCHAMBER PLUS FLO-VU) MISC     . SPIRIVA RESPIMAT 2.5 MCG/ACT AERS INHALE 2 PUFFS INTO LUNGS DAILY 1 Inhaler 3  . predniSONE (DELTASONE) 20 MG tablet      No current facility-administered medications for this encounter.     Physical Findings:  height is 5' 5.5" (1.664 m) and weight is 125 lb 8 oz (56.9 kg). His oral temperature is 97.8 F (36.6 C). His blood pressure is 93/65 and his pulse is 93. His respiration is 20 and oxygen saturation is 98%. .   In general this is a well appearing caucasian male in no acute distress. He's alert and oriented x4 and appropriate throughout the examination. Cardiopulmonary assessment is negative for acute distress and he exhibits normal effort.    Lab  Findings: Lab Results  Component Value Date   WBC 8.3 07/25/2016   HGB 7.1 (L) 07/25/2016   HCT 21.7 (L) 07/25/2016   MCV 100.5 (H) 07/25/2016   PLT 187 07/25/2016     Radiographic Findings: Mr Tyler Aguilar [facility] Contrast  Result Date: 07/21/2016 CLINICAL DATA:  Squamous cell carcinoma lung. Staging for metastatic disease. EXAM: MRI HEAD WITHOUT AND WITH CONTRAST TECHNIQUE: Multiplanar, multiecho pulse sequences of the brain and surrounding structures were obtained without and with intravenous contrast. CONTRAST:  22m MULTIHANCE GADOBENATE DIMEGLUMINE 529 MG/ML IV SOLN COMPARISON:  None. FINDINGS: Ventricle size normal.  Cerebral volume normal for age. Negative for acute infarct. Scattered small nonenhancing white matter lesions bilaterally consistent with chronic microvascular ischemia. Chronic infarct left pons. Chronic micro hemorrhage left temporal lobe. No other areas of intracranial hemorrhage. Atherosclerotic disease in the posterior circulation. Extensive abnormal signal in the distal right vertebral artery which may be occluded. Restricted diffusion in the proximal basilar artery. This may be due to thrombus in an occluded segment which shows non enhancement . Correlate with neurologic symptoms. Postcontrast imaging demonstrates no metastatic deposits. The leptomeningeal enhancement normal. The dural venous sinus enhancement is normal. Paranasal sinuses clear.  Normal orbital contents Pituitary normal in size No worrisome skull of bony lesion identified. IMPRESSION: Mild chronic microvascular ischemia.  No acute infarct. Negative for metastatic disease Atherosclerotic disease in the posterior circulation. There is abnormal signal in the distal right vertebral artery which may be occluded. There is an area of restricted diffusion and loss of flow void in the proximal basilar which may also be occluded of indeterminate age. Electronically Signed   By: CFranchot GalloM.D.   On: 07/21/2016 14:41     Impression/Plan: 1. Stage IV T2b, N2, M1b NSCLC, squamous cell carcinoma of the right lung. The patient continues on systemic therapy with Dr. PWhitney Muse We will continue to follow this expectantly. His next MRI of the spine will be due in 3 months time. He states agreement and understanding we will follow-up with these results when available. Otherwise we will not have come down for follow-up before then. We would also be happy to start palliative radiotherapy if medical oncology feels this is indicated. 2. Thoracic back pain. The patient continues with fentanyl patches and 25 mcg/hr. I refilled his oxycodone as he will run out before he meets with Dr. PWhitney Museon Friday. I've encouraged him to consider using oxycodone more regularly as he has not had great pain relief with 1 tablet every 24 hours. 3. Hypotension. This is been checked previously during his radiation treatment, and is  stable. We will continue to follow this expectantly but is well that this is most likely multifactorial given the patient's pain medication and by mouth intake.    Carola Rhine, PAC

## 2016-08-04 ENCOUNTER — Encounter: Payer: Self-pay | Admitting: Radiation Oncology

## 2016-08-04 ENCOUNTER — Ambulatory Visit
Admission: RE | Admit: 2016-08-04 | Discharge: 2016-08-04 | Disposition: A | Discharge status: Home / Self Care | Payer: Medicare PPO | Source: Ambulatory Visit | Attending: Radiation Oncology | Admitting: Radiation Oncology

## 2016-08-04 VITALS — BP 93/65 | HR 93 | Temp 97.8°F | Resp 20 | Ht 65.5 in | Wt 125.5 lb

## 2016-08-04 DIAGNOSIS — M546 Pain in thoracic spine: Secondary | ICD-10-CM | POA: Diagnosis not present

## 2016-08-04 DIAGNOSIS — I959 Hypotension, unspecified: Secondary | ICD-10-CM | POA: Diagnosis not present

## 2016-08-04 DIAGNOSIS — C3490 Malignant neoplasm of unspecified part of unspecified bronchus or lung: Secondary | ICD-10-CM

## 2016-08-04 DIAGNOSIS — C3491 Malignant neoplasm of unspecified part of right bronchus or lung: Secondary | ICD-10-CM

## 2016-08-04 DIAGNOSIS — C7951 Secondary malignant neoplasm of bone: Secondary | ICD-10-CM | POA: Diagnosis not present

## 2016-08-04 MED ORDER — LORAZEPAM 1 MG PO TABS: 1.0000 mg | ORAL_TABLET | Freq: Three times a day (TID) | ORAL | 0 refills | Status: AC | PRN

## 2016-08-04 MED ORDER — OXYCODONE HCL 5 MG PO TABS: 5.0000 mg | ORAL_TABLET | Freq: Four times a day (QID) | ORAL | 0 refills | Status: AC | PRN

## 2016-08-04 NOTE — Progress Notes (Addendum)
Follow up s/p radiation bone mets 06/23/16-07/04/16 left iliac The patient left the office before the visit was finished. Femur, and spine  Pain behind right shoulder blade and front of right chest,  5/10 scale, has 25 mcg fentanyl patch left shoulder, coughs up less than a teaspoon bright red blood this am, last Thursday  Blood clots coughed up, appetite  So so staed, took ortho vitals, low b/p, no c/o dizzy ness light headed , This past Monday received 2 pints RBC'S, sob at times BP (!) 87/60 (BP Location: Left Arm, Patient Position: Sitting, Cuff Size: Normal)   Pulse 92   Temp 97.8 F (36.6 C) (Oral)   Resp 20   Ht 5' 5.5" (1.664 m)   Wt 125 lb 8 oz (56.9 kg)   SpO2 98% Comment: room air  BMI 20.57 kg/m   BP 93/65 (BP Location: Left Arm, Patient Position: Standing, Cuff Size: Normal)   Pulse 93   Temp 97.8 F (36.6 C) (Oral)   Resp 20   Ht 5' 5.5" (1.664 m)   Wt 125 lb 8 oz (56.9 kg)   SpO2 98% Comment: room air  BMI 20.57 kg/m  2:20 PM

## 2016-08-07 ENCOUNTER — Ambulatory Visit (HOSPITAL_COMMUNITY): Admission: RE | Admit: 2016-08-07 | Payer: Medicare PPO | Source: Ambulatory Visit

## 2016-08-12 ENCOUNTER — Ambulatory Visit (HOSPITAL_COMMUNITY)
Admission: RE | Admit: 2016-08-12 | Discharge: 2016-08-12 | Disposition: A | Discharge status: Home / Self Care | Payer: Medicare PPO | Source: Ambulatory Visit | Attending: Oncology | Admitting: Oncology

## 2016-08-12 DIAGNOSIS — I714 Abdominal aortic aneurysm, without rupture: Secondary | ICD-10-CM | POA: Diagnosis not present

## 2016-08-12 DIAGNOSIS — M899 Disorder of bone, unspecified: Secondary | ICD-10-CM | POA: Insufficient documentation

## 2016-08-12 DIAGNOSIS — I712 Thoracic aortic aneurysm, without rupture: Secondary | ICD-10-CM | POA: Insufficient documentation

## 2016-08-12 DIAGNOSIS — C3490 Malignant neoplasm of unspecified part of unspecified bronchus or lung: Secondary | ICD-10-CM

## 2016-08-12 MED ORDER — IOPAMIDOL (ISOVUE-300) INJECTION 61%
100.0000 mL | Freq: Once | INTRAVENOUS | Status: AC | PRN
  Administered 2016-08-12: 100 mL via INTRAVENOUS

## 2016-08-15 ENCOUNTER — Ambulatory Visit (HOSPITAL_COMMUNITY): Payer: Medicare PPO

## 2016-08-15 ENCOUNTER — Ambulatory Visit (HOSPITAL_COMMUNITY): Payer: Medicare PPO | Admitting: Hematology & Oncology

## 2016-08-15 DEATH — deceased

## 2016-08-18 NOTE — Progress Notes (Signed)
  Radiation Oncology         [phone] ________________________________  Name: Tyler Aguilar MRN: [id number]  Date: 06/16/2016  DOB: 1956-10-06  Optical Surface Tracking Plan:  Since intensity modulated radiotherapy (IMRT) and 3D conformal radiation treatment methods are predicated on accurate and precise positioning for treatment, intrafraction motion monitoring is medically necessary to ensure accurate and safe treatment delivery.  The ability to quantify intrafraction motion without excessive ionizing radiation dose can only be performed with optical surface tracking. Accordingly, surface imaging offers the opportunity to obtain 3D measurements of patient position throughout IMRT and 3D treatments without excessive radiation exposure.  I am ordering optical surface tracking for this patient's upcoming course of radiotherapy. ________________________________  Kyung Rudd, MD 08/18/2016 8:32 PM    Reference:   Particia Jasper, et al. Surface imaging-based analysis of intrafraction motion for breast radiotherapy patients.Journal of [place], n. 6, nov. 2014. ISSN [id number].   Available at: <http://www.jacmp.org/index.php/jacmp/article/view/4957>.

## 2016-08-18 NOTE — Addendum Note (Signed)
Encounter addended by: Kyung Rudd, MD on: 08/18/2016  8:32 PM<BR>    Actions taken: Visit diagnoses modified, Sign clinical note

## 2016-08-18 NOTE — Progress Notes (Signed)
  Radiation Oncology         [phone] ________________________________  Name: Tyler Aguilar MRN: [id number]  Date: 06/27/2016  DOB: [birth year]  SIMULATION AND TREATMENT PLANNING NOTE  DIAGNOSIS:     ICD-9-CM ICD-10-CM   1. Bone metastasis ([facility]) 198.5 C79.51     Site:  Spine at the T4 level  NARRATIVE:  The patient was brought to the [facility].  Identity was confirmed.  All relevant records and images related to the planned course of therapy were reviewed.   Written consent to proceed with treatment was confirmed which was freely given after reviewing the details related to the planned course of therapy had been reviewed with the patient.  Then, the patient was set-up in a stable reproducible supine position for radiation therapy.  CT images were obtained.  Surface markings were placed.    Medically necessary complex treatment device(s) for immobilization:  1.  customized body-fix device, 2.  customized accuform device.   The CT images were loaded into the planning software.  Then the target and avoidance structures were contoured.  Treatment planning then occurred.  The radiation prescription was entered and confirmed.   The patient will receive a course of stereotactic radiosurgery to the spine. I am therefore requesting a 3-D conformal technique. The target volume has been contoured in addition to the spinal cord and surrounding critical at risk organs.  Dose volume histograms of each of these structures will be carefully reviewed as part of the 3-D conformal technique.   PLAN:  The patient will receive 18 Gy in 1 fraction(s).  ________________________________   Jodelle Gross, MD, PhD

## 2016-08-18 NOTE — Addendum Note (Signed)
Encounter addended by: Kyung Rudd, MD on: 08/18/2016  8:35 PM<BR>    Actions taken: Sign clinical note

## 2016-08-18 NOTE — Progress Notes (Signed)
  Radiation Oncology         [phone] ________________________________  Name: Tyler Aguilar MRN: [id number]  Date: 06/16/2016  DOB: [birth year]  SIMULATION AND TREATMENT PLANNING NOTE  DIAGNOSIS:     ICD-9-CM ICD-10-CM   1. Bone metastasis (HCC) 198.5 C79.51      Site:   1.  Left femur 2.  Left iliac wing  NARRATIVE:  The patient was brought to the [facility].  Identity was confirmed.  All relevant records and images related to the planned course of therapy were reviewed.   Written consent to proceed with treatment was confirmed which was freely given after reviewing the details related to the planned course of therapy had been reviewed with the patient.  Then, the patient was set-up in a stable reproducible  supine position for radiation therapy.  CT images were obtained.  Surface markings were placed.    Medically necessary complex treatment device(s) for immobilization:  Customized vac lock bag.   The CT images were loaded into the planning software.  Then the target and avoidance structures were contoured.  Treatment planning then occurred.  The radiation prescription was entered and confirmed.  A total of 5 complex treatment devices were fabricated which relate to the designed radiation treatment fields corresponding in total to the 2 separate target regions above, 2 fields for the left femur and 3 fields for the left iliac region. Each of these customized fields/ complex treatment devices will be used on a daily basis during the radiation course. I have requested : 3D Simulation  I have requested a DVH of the following structures: Target volume, femoral heads, rectum, bowel.   PLAN:  The patient will receive 30 Gy in 10 fractions.  ________________________________   Jodelle Gross, MD, PhD

## 2016-08-19 ENCOUNTER — Encounter (HOSPITAL_COMMUNITY): Payer: Self-pay | Admitting: Hematology & Oncology

## 2016-08-19 ENCOUNTER — Encounter (HOSPITAL_COMMUNITY): Payer: Medicare PPO | Attending: Hematology & Oncology | Admitting: Hematology & Oncology

## 2016-08-19 VITALS — BP 97/70 | HR 97 | Temp 98.0°F | Resp 18 | Wt 124.2 lb

## 2016-08-19 DIAGNOSIS — R634 Abnormal weight loss: Secondary | ICD-10-CM

## 2016-08-19 DIAGNOSIS — E871 Hypo-osmolality and hyponatremia: Secondary | ICD-10-CM

## 2016-08-19 DIAGNOSIS — R938 Abnormal findings on diagnostic imaging of other specified body structures: Secondary | ICD-10-CM | POA: Insufficient documentation

## 2016-08-19 DIAGNOSIS — D6481 Anemia due to antineoplastic chemotherapy: Secondary | ICD-10-CM

## 2016-08-19 DIAGNOSIS — C7951 Secondary malignant neoplasm of bone: Secondary | ICD-10-CM

## 2016-08-19 DIAGNOSIS — C3491 Malignant neoplasm of unspecified part of right bronchus or lung: Secondary | ICD-10-CM | POA: Insufficient documentation

## 2016-08-19 DIAGNOSIS — D630 Anemia in neoplastic disease: Secondary | ICD-10-CM

## 2016-08-19 DIAGNOSIS — R05 Cough: Secondary | ICD-10-CM | POA: Insufficient documentation

## 2016-08-19 DIAGNOSIS — R042 Hemoptysis: Secondary | ICD-10-CM | POA: Insufficient documentation

## 2016-08-19 DIAGNOSIS — Z7189 Other specified counseling: Secondary | ICD-10-CM

## 2016-08-19 DIAGNOSIS — C787 Secondary malignant neoplasm of liver and intrahepatic bile duct: Secondary | ICD-10-CM

## 2016-08-19 DIAGNOSIS — I480 Paroxysmal atrial fibrillation: Secondary | ICD-10-CM

## 2016-08-19 DIAGNOSIS — C3490 Malignant neoplasm of unspecified part of unspecified bronchus or lung: Secondary | ICD-10-CM

## 2016-08-19 MED ORDER — ONDANSETRON HCL 8 MG PO TABS: 8.0000 mg | ORAL_TABLET | ORAL | 3 refills | Status: AC | PRN

## 2016-08-19 MED ORDER — FENTANYL 25 MCG/HR TD PT72: 25.0000 ug | MEDICATED_PATCH | TRANSDERMAL | 0 refills | Status: AC

## 2016-08-19 NOTE — Progress Notes (Signed)
.   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 (Incompletely staged) EBUS FNA subcarinal mass, squamous cell carcinoma 01/01/2016 RT for palliation of central lung NSCLC at [place] started on 01/11/2016 Dysphagis, mild oral and mild moderate pharyngeal dysphagia Paroxysmal afib with EF 35% on ECHO 01/22/2016 Intractable hiccups on Baclofen 10 mg po tid Anemia, chronic disease Hyponatremia Acute hypoxic respiratory failure, COPD History of CVA with residual L sided weakness Urgent XRT secondary to airway compromise from NSCLC    Squamous cell carcinoma of lung, stage IV ([place])   01/01/2016 Pathology Results    EBUS FNA subcarinal mass, squamous cell carcinoma      01/03/2016 Pathology Results    ALK negative.  ROS1 negative.      01/11/2016 -  Radiation Therapy    RT for palliation of central lung NSCLC at [place] started on 01/11/2016      01/17/2016 Pathology Results    PD-L1 shows high expression- proportion score of 50- 60%      03/07/2016 - 05/30/2016 Chemotherapy    Carboplatin/Abraxane day 1, 8, 15 every 28 day      03/12/2016 Imaging    CT chest- Pulmonary parenchymal pattern of peribronchovascular ground-glass, nodularity and consolidation is likely new or progressive from chest radiograph 02/27/2016 and may be due to an infectious bronchiolitis/bronchopneumonia.       04/29/2016 Imaging    CT chest- Continued decreased size of the infiltrative subcarinal mass. Significant decrease in patchy  basilar-predominant nodular interlobular and peribronchovascular interstitial thickening, favoring decreasing lymphangitic tumor.      05/29/2016 Imaging    Bone scan- Focal photopenic abnormality in the left iliac wing.  Suspect lytic bone lesion or less likely, artifact.      05/30/2016 Imaging    Pelvis xray- Large lytic lesions in the left iliac wing and proximal left femur consistent with metastatic disease.      05/30/2016 Imaging    CT ADDENDUM: Further review of the 04/29/16 chest CT was performed in light of the L pelvic lytic metastases identified on 05/30/16 There is a slightly expansile lytic osseous metastasis in the right T4 pedicle/pars interarticularis new since 03/12/16      05/30/2016 Progression    Imaging demonstrates new lesions and CT scan was re-reviewed after new left iliac wing lytic lesion.  Review demonstrates a new right T4 lytic lesion new since 03/12/2016.      06/13/2016 -  Chemotherapy    Keytruda      06/23/2016 PET scan    Multifocal sub solid lesions involving the lungs are intensely hypermetabolic and compatible with primary bronchogenic carcinoma. Hypermetabolic sub- carinal lymph node compatible with metastatic adenopathy. Multifocal lytic bone metastases.      06/26/2016 Imaging    MR T-spine- Metastatic disease to T4 vertebral body on the right with progression since 04/29/2016. There is mild tumor in the lateral epidural space on the right without cord compression.      06/27/2016 - 07/04/2016 Radiation Therapy    SRS to left femur ,left iliac by Dr. Lisbeth Renshaw      07/02/2016 Imaging    CT T-spine- Myelogram for SRS treatment planning of destructive T4 metastasis. Small volume epidural tumor on the right without spinal cord compression. Right T4-5 neural foraminal involvement.  07/21/2016 Imaging    MRI brain- Negative for metastatic disease        HISTORY OF PRESENTING ILLNESS:  Tyler Aguilar 60 y.o. male is here for follow-up of stage IV squamous cell carcinoma of the lung. He was initially clinically staged at [facility] as staged III (T1N2M0) Squamous cell carcinoma of the R lung. He was initially treated with emergent XRT at [facility] due to airway compromise.  Chemotherapy with carboplatin/abraxane was given sequentially. He progressed through therapy. Disease is PDL1 positive with high expression.   He has been on Keytruda since 06/13/2016.   Since 7/3 he has lost 16 pounds. He notes back pain, pelvic pain. Appetite is marginal. He presents with his sister to review imaging and for ongoing recommendations.     Clinically he is not as active as he was back in June/July. He has had difficulty accepting the advanced nature of his disease.    MEDICAL HISTORY:  Past Medical History:  Diagnosis Date  . AAA (abdominal aortic aneurysm) (HCC)    4.5 cm December 2016  . Cardiomyopathy ([place])    LVEF 35% February 2017 [facility]  . COPD (chronic obstructive pulmonary disease) ([place])   . Dyslipidemia   . Lung cancer ([place])    Stage III squamous cell, right lung  . PAF (paroxysmal atrial fibrillation) (HCC)    Versus SVT documented February 2017 [facility]  . Stroke [facility])    May 2009 - R medullary CVA w L-sided weakness, sensory loss, and dysarhria/dysphagia    SURGICAL HISTORY: Past Surgical History:  Procedure Laterality Date  . BRONCHOSCOPY    . COLONOSCOPY  08/31/2012   Procedure: COLONOSCOPY;  Surgeon: Jamesetta So, MD;  Location: [facility] ENDO SUITE;  Service: Gastroenterology;  Laterality: N/A;  . FINGER SURGERY Right 1979  . PORTACATH PLACEMENT Left 02/27/2016   Procedure: INSERTION PORT-A-CATH LEFT SUBCLAVIAN;  Surgeon: Aviva Signs, MD;  Location: [facility] ORS;  Service: General;  Laterality: Left;    SOCIAL HISTORY: Social History   Social History  . Marital status: Married    Spouse name: N/A  . Number of children: N/A  . Years of education: N/A   Occupational History  . Not on file.   Social History Main Topics  . Smoking status: Former Smoker    Types: Cigarettes    Quit date: 12/31/2015  . Smokeless tobacco: Former Systems developer  . Alcohol use 0.0 oz/week  . Drug use: No  . Sexual activity: Not on file   Other Topics Concern  . Not on  file   Social History Narrative  . No narrative on file  Currently going through a divorce 0 children Ex smoker. Previously worked as a Dealer.  FAMILY HISTORY: Family History  Problem Relation Age of Onset  . Diabetes Mellitus II Mother   . Cancer Mother   . Cancer Father   . Coronary artery disease Brother     CABG   indicated that the status of his mother is unknown. He indicated that the status of his father is unknown. He indicated that the status of his brother is unknown.    Father died of a hematoma of the liver at 57 yo Mother died of heart failure at 5 yo  ALLERGIES:  has No Known Allergies.  MEDICATIONS:  Current Outpatient Prescriptions  Medication Sig Dispense Refill  . aspirin 81 MG tablet Take 81 mg by mouth daily.    Marland Kitchen atorvastatin (LIPITOR) 80 MG tablet Take 80 mg by mouth  every evening.    . budesonide-formoterol (SYMBICORT) 160-4.5 MCG/ACT inhaler Inhale 2 puffs into the lungs 2 (two) times daily.    . carvedilol (COREG) 3.125 MG tablet Take 1 tablet (3.125 mg total) by mouth 2 (two) times daily. 180 tablet 3  . cholecalciferol (VITAMIN D) 1000 units tablet Take 1,000 Units by mouth daily.    Marland Kitchen escitalopram (LEXAPRO) 10 MG tablet Take 1 tablet (10 mg total) by mouth daily. 90 tablet 1  . fentaNYL (DURAGESIC - DOSED MCG/HR) 25 MCG/HR patch Place 1 patch (25 mcg total) onto the skin every 3 (three) days. 10 patch 0  . levofloxacin (LEVAQUIN) 500 MG tablet Take 1 tablet (500 mg total) by mouth daily. 14 tablet 0  . lidocaine-prilocaine (EMLA) cream Apply a quarter size amount to port site 1 hour prior to chemo. Do not rub in. Cover with plastic wrap. 30 g 3  . LORazepam (ATIVAN) 1 MG tablet Take 1 tablet (1 mg total) by mouth 3 (three) times daily as needed for anxiety. 60 tablet 0  . losartan (COZAAR) 25 MG tablet Take 0.5 tablets (12.5 mg total) by mouth daily. 45 tablet 3  . ondansetron (ZOFRAN) 8 MG tablet Take 8 mg by mouth as needed.     Marland Kitchen oxyCODONE  (ROXICODONE) 5 MG immediate release tablet Take 1 tablet (5 mg total) by mouth every 6 (six) hours as needed for severe pain. 60 tablet 0  . pantoprazole (PROTONIX) 40 MG tablet Take 1 tablet (40 mg total) by mouth daily. 90 tablet 1  . predniSONE (DELTASONE) 20 MG tablet     . pyridOXINE (VITAMIN B-6) 50 MG tablet Take 50 mg by mouth daily.    . ranitidine (ZANTAC) 150 MG tablet Take 1 tablet (150 mg total) by mouth 2 (two) times daily. 180 tablet 1  . rivaroxaban (XARELTO) 20 MG TABS tablet Take 1 tablet (20 mg total) by mouth daily. 90 tablet 2  . Spacer/Aero-Holding Chambers (AEROCHAMBER PLUS FLO-VU) MISC     . SPIRIVA RESPIMAT 2.5 MCG/ACT AERS INHALE 2 PUFFS INTO LUNGS DAILY 1 Inhaler 3   No current facility-administered medications for this visit.     Review of Systems  Constitutional: Negative for weight loss.  HENT: Positive for nosebleeding. Nose blood clots associated with aspirin Eyes: Negative.   Respiratory: Negative Cardiovascular: Negative.   Gastrointestinal: Negative.  Negative for diarrhea.  Genitourinary: Negative.   Musculoskeletal: Positive for back, joint, and bone pain. Negative for falls. Skin: Negative.   Neurological: Left leg weakness, chronic since prior CVA. LL pain Endo/Heme/Allergies: Negative.   Psychiatric/Behavioral: Currently denies All other systems reviewed and are negative. 14 point ROS was done and is otherwise as detailed above or in HPI    PHYSICAL EXAMINATION: ECOG PERFORMANCE STATUS: 1 - Symptomatic but completely ambulatory  Vitals - 1 value per visit 12/17/7515  SYSTOLIC 97  DIASTOLIC 70  Pulse 97  Temperature 98  Respirations 18  Weight (lb) 124.2  Height   BMI 20.35  VISIT REPORT    Physical Exam  Constitutional: He is oriented to person, place, and time and well-developed, thin, tearful at points throughout our conversation Ambulated to the clinic today, with a cane. Thinning hair HENT:  Head: Normocephalic and atraumatic.   Nose: Nose normal.  Mouth/Throat: Oropharynx is clear and moist. No oropharyngeal exudate.  Eyes: Conjunctivae and EOM are normal. Pupils are equal, round, and reactive to light. Right eye exhibits no discharge. Left eye exhibits no discharge. No scleral icterus.  Neck: Normal range of motion. Neck supple. No tracheal deviation present. No thyromegaly present.  Cardiovascular: Normal rate, regular rhythm and normal heart sounds.  Exam reveals no gallop and no friction rub.   No murmur heard. Pulmonary/Chest: Effort normal. He has no wheezes. He has no rales.  Abdominal: Soft. Bowel sounds are normal. He exhibits no distension and no mass. There is no tenderness. There is no rebound and no guarding.  Musculoskeletal: Normal range of motion. He exhibits no edema.  Lymphadenopathy:    He has no cervical adenopathy.  Neurological: He is alert and oriented to person, place, and time. No cranial nerve deficit.  Skin: Skin is warm and dry. No rash noted.  Psychiatric: Mood, memory, affect and judgment normal.  Nursing note and vitals reviewed.  LABORATORY DATA:  I have reviewed the data as listed  CBC    Component Value Date/Time   WBC 8.3 07/25/2016 1022   RBC 2.16 (L) 07/25/2016 1022   HGB 7.1 (L) 07/25/2016 1022   HCT 21.7 (L) 07/25/2016 1022   PLT 187 07/25/2016 1022   MCV 100.5 (H) 07/25/2016 1022   MCH 32.9 07/25/2016 1022   MCHC 32.7 07/25/2016 1022   RDW 17.3 (H) 07/25/2016 1022   LYMPHSABS 0.3 (L) 07/25/2016 1022   MONOABS 0.8 07/25/2016 1022   EOSABS 0.1 07/25/2016 1022   BASOSABS 0.0 07/25/2016 1022   CMP     Component Value Date/Time   NA 132 (L) 07/25/2016 1022   K 3.6 07/25/2016 1022   CL 97 (L) 07/25/2016 1022   CO2 27 07/25/2016 1022   GLUCOSE 94 07/25/2016 1022   BUN 9 07/25/2016 1022   CREATININE 0.55 (L) 07/25/2016 1022   CALCIUM 8.2 (L) 07/25/2016 1022   PROT 6.4 (L) 07/25/2016 1022   ALBUMIN 2.2 (L) 07/25/2016 1022   AST 25 07/25/2016 1022   ALT 31  07/25/2016 1022   ALKPHOS 83 07/25/2016 1022   BILITOT 0.6 07/25/2016 1022   GFRNONAA >60 07/25/2016 1022   GFRAA >60 07/25/2016 1022      RADIOGRAPHIC STUDIES: I have personally reviewed the radiological images as listed and agreed with the findings in the report. Study Result   CLINICAL DATA:  Stage IV non-small cell lung cancer, status post chemotherapy and radiation therapy, restaging assessment.  EXAM: CT CHEST, ABDOMEN, AND PELVIS WITH CONTRAST  TECHNIQUE: Multidetector CT imaging of the chest, abdomen and pelvis was performed following the standard protocol during bolus administration of intravenous contrast.  CONTRAST:  159m ISOVUE-300 IOPAMIDOL (ISOVUE-300) INJECTION 61%  COMPARISON:  06/10/2016 PET-CT ; CT chest from 04/29/2016  FINDINGS: CT CHEST FINDINGS  Cardiovascular: Carotid, subclavian, brachiocephalic, and aortic atherosclerotic vascular calcification noted. Ascending thoracic aorta 4.0 cm in diameter, mildly aneurysmal. Calcification of the aortic valve along with fairly dense coronary artery atherosclerotic calcification. Trace pericardial effusion.  Mediastinum/Nodes: Left Port-A-Cath tip: SVC. Subcarinal mass measures 3.2 cm in short axis and has central hypodensity, previously up to 2.4 cm in short axis. This mass splays the carina on image 55/5, and tracks around part of the proximal mainstem bronchi bilaterally. The mass abuts the medial margins of both the right and left pulmonary arteries as shown on image 49/5, I do not see direct invasion into the lumen of either artery. There is probably some early invasion of the left mainstem bronchus wall. This mass was previously intensely hypermetabolic.  Lungs/Pleura: Triangular density at the right lung apex 1.5 by 0.9 cm on image 30/4, not readily appreciated on  the prior PET-CT of 06/10/2016. This extends caudad along the posterior margin of the right upper lobe.  Sub solid nodule in  the right upper lobe, 0.9 by 0.6 cm on image 50/4.  Peripheral triangular nodularity in the right upper lobe approximately 1.9 by 1.5 cm on image 65/4, previously about 1.4 by 1.3 cm but also previously surrounded by ground-glass density which is more confluent today.  The thick-walled bilobed cavitary lesion inferiorly in the right upper lobe, just above the minor fissure, with the more cephalad component measuring 2.6 by 1.6 cm in the more anterior component in the right upper lobe measuring 2.2 by 4.5 cm with only a small cystic dilated airspace at the end of some air bronchograms. Once again this represents a worsening of the appearance on the prior exam and there is surrounding scattered irregular nodularity in the right upper lobe.  Ground-glass nodule in the right middle lobe, 1.0 by 0.9 cm on image 101/4, new. Mild scattered peripheral nodularity in the right lower lobe appears increased or new, including a 0.6 by 0.4 cm nodule in the right lower lobe on image 131/4.  Some of the peripheral ground-glass opacities in the left upper lobe have improved. Scattered minimal nodularity in the lingula and left lower lobe, more confluent in the left lower lobe posteriorly as on image 78/4 were there is some dependent ground-glass opacity with some new nodularity including an indistinct 0.8 by 0.6 dense component on image 78/4 surrounded by ground-glass opacity.  Musculoskeletal: Increase in size of the large destructive mass of the right T4 transverse process, pedicle, lamina, and right fourth rib, with extraosseous extension of mass effacing the soft tissues, and with high suspicion for significant right eccentric intraspinal component potentially compressing the cord. It is difficult to determine how much of the spinal canal is fill by tumor but it could be over half of the spinal canal. There has been interval collapse of the right side of the T4 vertebral body, with about  80% loss of height, with fracture extending along both the superior and inferior endplate. There is a pathologic fracture extending through the spinous process base as on image 91/6, with some anterior shearing of the T4 vertebral body such that T 3 is about 6 mm anteriorly subluxed with respect to T5. There is some inferior endplate destruction or fracture at the T3 level on the right.  Note is also made of a central disc protrusion at the T10-11 level.  CT ABDOMEN PELVIS FINDINGS  Hepatobiliary: New mass posteriorly in the right hepatic lobe, segment 7, 4.2 by 3.9 cm, not present on prior exams. Several additional small hypodense hepatic lesions are probably cysts. Mildly contracted gallbladder.  Pancreas: Unremarkable  Spleen: Unremarkable  Adrenals/Urinary Tract: Vascular calcifications along the renal hila bilaterally. Mild bladder wall thickening likely primarily due to nondistention. Chronic flattening of the anterior contour of the left kidney lower pole.  Stomach/Bowel: Prominent stool throughout the colon favors constipation.  Vascular/Lymphatic: Aortoiliac atherosclerotic vascular disease. Bilobed infrarenal abdominal aortic aneurysm with upper portion measuring 3.4 cm in AP dimension and lower portion measuring 4.0 cm anterior- posterior by 4.7 cm transverse, with some mural thrombus. Indistinct aortocaval node 0.7 cm in short axis on image 82/2.  Reproductive: Unremarkable  Other: No supplemental non-categorized findings.  Musculoskeletal: Rim enhancing metastatic lesion in the right paraspinal musculature 2.6 by 1.9 cm on image 82/2. Destructive left iliac crest mass measures approximately 8.6 by 6.1 cm on image 94/2 with extensive soft tissue  component, previously about 5.2 by 4.7 cm. This invades the gluteal and iliacus musculature. There also appears to be invading along the superior margin of the left sacroiliac joint into the iliac  bone.  Metastatic lesion of the left posterior femur at the lesser trochanteric level measures approximately a 3.1 by 2.4 cm, increased in size from prior, high risk for pathologic fracture, limited weight-bearing suggested.  Lucency along the inferior margin of the L3 vertebra, probably a Schmorl's node rather than a metastatic lesion. Degenerative disc disease in the lower lumbar spine.  IMPRESSION: 1. Worsening metastatic disease and malignancy. Progressive cavitary lesions inferiorly in the right upper lobe favoring malignancy over infection. Enlarging musculoskeletal metastatic lesions and a new large mass posteriorly in the right hepatic lobe compatible with a new metastatic lesion. Various scattered nodules in the lungs. The dominant subcarinal mass has enlarged, currently 3.2 cm in short axis, spleen the carina and possibly invading part of the left mainstem bronchus. 2. Several of the bony lesions warrant special mention. The T4 vertebral lesion is now associated with about 80% collapse of the left side of the T4 vertebra along with some anterior shearing along the fracture plane, and a pathologic fracture through the spinous process in addition to the prominent bony destructive findings of the right lamina, pedicle, and facet. In addition there is considerable intraspinal tumor at this level in cord compression is not excluded. The shearing along the fracture plane results in about 5 mm of malalignment, with the T3 level sitting anterior to the T5 level. This could result in some degree of instability/cord compression. 3. Considerable enlargement of the metastatic lesions in the right paraspinal musculature, the left iliac crest, in the left proximal femur. Left proximal femoral lesion predisposes to pathologic fracture, and limited weight-bearing is recommended. 4. Various similar vascular findings including a small aneurysm of the ascending thoracic aorta and  aneurysm of the abdominal aorta measuring up to 4.6 cm in diameter. No findings of retroperitoneal leak. These results will be called to the ordering clinician or representative by the Radiologist Assistant, and communication documented in the PACS or zVision Dashboard.   Electronically Signed   By: Van Clines M.D.   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 He progressed through carboplatin/abraxane, he has progressed through [place] with new liver mets and pulmonary disease, in spite of XRT, bone disease appears progressive as well. He has lost a significant amount of weight since July and PS is declining.  WE discussed hospice/pallliatve care in detail. However, this has never been an easy issue for him to accept, nor for his family. I have offered him a second opinion at [place] with Dr. Aniceto Boss.  I feel his benefit from additional therapy will be low.  They will call us once they have decided on options.  I do not feel at this point they will pursue palliative care. If we do not hear from them within the week, we will call them to see if we can  be of further assistance.  All questions were answered. The patient knows to call the clinic with any problems, questions or concerns.  This document serves as a record of services personally performed by Ancil Linsey, MD. It was created on her behalf by Elmyra Ricks, a trained medical scribe. The creation of this record is based on the scribe's personal observations and the provider's statements to them. This document has been checked and approved by the attending provider.  I have reviewed the above documentation for accuracy and completeness, and I agree with the above.  This note was electronically signed.  Molli Hazard, MD   08/19/2016 8:06 AM

## 2016-08-19 NOTE — Patient Instructions (Addendum)
[place] at [facility] Discharge Instructions  RECOMMENDATIONS MADE BY THE CONSULTANT AND ANY TEST RESULTS WILL BE SENT TO YOUR REFERRING PHYSICIAN.  You saw [name] today. Call us after you have discussed plans. Zofran called in to pharmacy.  Thank you for choosing [place] at [facility] to provide your oncology and hematology care.  To afford each patient quality time with our provider, please arrive at least 15 minutes before your scheduled appointment time.   Beginning January 23rd 2017 lab work for the [facility] will be done in the  Main lab at [facility] on 1st floor. If you have a lab appointment with the [place] please come in thru the  Main Entrance and check in at the main information desk  You need to re-schedule your appointment should you arrive 10 or more minutes late.  We strive to give you quality time with our providers, and arriving late affects you and other patients whose appointments are after yours.  Also, if you no show three or more times for appointments you may be dismissed from the clinic at the providers discretion.     Again, thank you for choosing [facility].  Our hope is that these requests will decrease the amount of time that you wait before being seen by our physicians.       _____________________________________________________________  Should you have questions after your visit to [facility] please contact our office at [phone] between the hours of 8:30 a.m. and 4:30 p.m.  Voicemails left after 4:30 p.m. will not be returned until the following business day.  For prescription refill requests, have your pharmacy contact our office.         Resources For Cancer Patients and their Caregivers ? American Cancer Society: Can assist with transportation, wigs, general needs, runs Look Good Feel Better.        202 510 9143 ? Cancer Care: Provides financial  assistance, online support groups, medication/co-pay assistance.  1-800-813-HOPE (304) 473-4640) ? [employer] Assists [place] cancer patients and their families through emotional , educational and financial support.  (854)188-6138 ? [place] DSS Where to apply for food stamps, Medicaid and utility assistance. 614 648 9647 ? [employer]: Transportation to medical appointments. 971-790-5619 ? Social Security Administration: May apply for disability if have a Stage IV cancer. 8703699157 2098268630 ? [employer], Disability and Transit Services: Assists with nutrition, care and transit needs. [place] Support Programs: '@10RELATIVEDAYS'$ @ > Cancer Support Group  2nd Tuesday of the month 1pm-2pm, Journey Room  > Creative Journey  3rd Tuesday of the month 1130am-1pm, Journey Room  > Look Good Feel Better  1st Wednesday of the month 10am-12 noon, Journey Room (Call [place] to register [phone])

## 2016-08-22 ENCOUNTER — Telehealth (HOSPITAL_COMMUNITY): Payer: Self-pay | Admitting: *Deleted

## 2016-08-22 DIAGNOSIS — J069 Acute upper respiratory infection, unspecified: Secondary | ICD-10-CM

## 2016-08-25 ENCOUNTER — Ambulatory Visit (HOSPITAL_COMMUNITY): Payer: Medicare PPO | Admitting: Oncology

## 2016-08-27 ENCOUNTER — Encounter (HOSPITAL_COMMUNITY): Payer: Self-pay | Admitting: Emergency Medicine

## 2016-08-27 ENCOUNTER — Other Ambulatory Visit: Payer: Self-pay

## 2016-08-27 ENCOUNTER — Emergency Department (HOSPITAL_COMMUNITY): Payer: Medicare PPO

## 2016-08-27 ENCOUNTER — Emergency Department (HOSPITAL_COMMUNITY)
Admission: EM | Admit: 2016-08-27 | Discharge: 2016-08-27 | Disposition: A | Discharge status: Home / Self Care | Payer: Medicare PPO | Attending: Emergency Medicine | Admitting: Emergency Medicine

## 2016-08-27 DIAGNOSIS — Z87891 Personal history of nicotine dependence: Secondary | ICD-10-CM | POA: Insufficient documentation

## 2016-08-27 DIAGNOSIS — Z043 Encounter for examination and observation following other accident: Secondary | ICD-10-CM | POA: Diagnosis present

## 2016-08-27 DIAGNOSIS — C3491 Malignant neoplasm of unspecified part of right bronchus or lung: Secondary | ICD-10-CM | POA: Diagnosis not present

## 2016-08-27 DIAGNOSIS — J449 Chronic obstructive pulmonary disease, unspecified: Secondary | ICD-10-CM | POA: Insufficient documentation

## 2016-08-27 DIAGNOSIS — Z79899 Other long term (current) drug therapy: Secondary | ICD-10-CM | POA: Insufficient documentation

## 2016-08-27 DIAGNOSIS — Z7982 Long term (current) use of aspirin: Secondary | ICD-10-CM | POA: Diagnosis not present

## 2016-08-27 DIAGNOSIS — C3490 Malignant neoplasm of unspecified part of unspecified bronchus or lung: Secondary | ICD-10-CM

## 2016-08-27 DIAGNOSIS — W19XXXA Unspecified fall, initial encounter: Secondary | ICD-10-CM

## 2016-08-27 DIAGNOSIS — R4182 Altered mental status, unspecified: Secondary | ICD-10-CM | POA: Diagnosis not present

## 2016-08-27 DIAGNOSIS — Z7901 Long term (current) use of anticoagulants: Secondary | ICD-10-CM | POA: Diagnosis not present

## 2016-08-27 LAB — COMPREHENSIVE METABOLIC PANEL
ALBUMIN: 2.4 g/dL — AB (ref 3.5–5.0)
ALK PHOS: 95 U/L (ref 38–126)
ALT: 21 U/L (ref 17–63)
AST: 22 U/L (ref 15–41)
Anion gap: 10 (ref 5–15)
BILIRUBIN TOTAL: 0.7 mg/dL (ref 0.3–1.2)
BUN: 11 mg/dL (ref 6–20)
CALCIUM: 11.1 mg/dL — AB (ref 8.9–10.3)
CO2: 26 mmol/L (ref 22–32)
Chloride: 96 mmol/L — ABNORMAL LOW (ref 101–111)
Creatinine, Ser: 0.56 mg/dL — ABNORMAL LOW (ref 0.61–1.24)
GFR calc Af Amer: >60 mL/min (ref >60)
GFR calc non Af Amer: >60 mL/min (ref >60)
GLUCOSE: 82 mg/dL (ref 65–99)
Potassium: 3.8 mmol/L (ref 3.5–5.1)
Sodium: 132 mmol/L — ABNORMAL LOW (ref 135–145)
TOTAL PROTEIN: 6.7 g/dL (ref 6.5–8.1)

## 2016-08-27 LAB — CBC WITH DIFFERENTIAL/PLATELET
BASOS ABS: 0 10*3/uL (ref 0.0–0.1)
BASOS PCT: 0 %
Eosinophils Absolute: 0.1 10*3/uL (ref 0.0–0.7)
Eosinophils Relative: 2 %
HEMATOCRIT: 27.2 % — AB (ref 39.0–52.0)
HEMOGLOBIN: 8.5 g/dL — AB (ref 13.0–17.0)
Lymphocytes Relative: 5 %
Lymphs Abs: 0.4 10*3/uL — ABNORMAL LOW (ref 0.7–4.0)
MCH: 30.4 pg (ref 26.0–34.0)
MCHC: 31.3 g/dL (ref 30.0–36.0)
MCV: 97.1 fL (ref 78.0–100.0)
MONOS PCT: 10 %
Monocytes Absolute: 0.7 10*3/uL (ref 0.1–1.0)
NEUTROS ABS: 6.2 10*3/uL (ref 1.7–7.7)
NEUTROS PCT: 83 %
Platelets: 199 10*3/uL (ref 150–400)
RBC: 2.8 MIL/uL — ABNORMAL LOW (ref 4.22–5.81)
RDW: 15.1 % (ref 11.5–15.5)
WBC: 7.4 10*3/uL (ref 4.0–10.5)

## 2016-08-27 LAB — CBG MONITORING, ED: GLUCOSE-CAPILLARY: 67 mg/dL (ref 65–99)

## 2016-08-27 MED ORDER — LEVOFLOXACIN 500 MG PO TABS: 500.0000 mg | ORAL_TABLET | Freq: Every day | ORAL | 0 refills | Status: AC

## 2016-08-27 MED ORDER — MORPHINE SULFATE (PF) 4 MG/ML IV SOLN
4.0000 mg | Freq: Once | INTRAVENOUS | Status: AC
  Administered 2016-08-27: 4 mg via INTRAVENOUS
  Filled 2016-08-27: qty 1

## 2016-08-27 MED ORDER — SODIUM CHLORIDE 0.9 % IV BOLUS (SEPSIS)
1000.0000 mL | Freq: Once | INTRAVENOUS | Status: AC
  Administered 2016-08-27: 1000 mL via INTRAVENOUS

## 2016-08-27 MED ORDER — SODIUM CHLORIDE 0.9 % IV BOLUS (SEPSIS)
1000.0000 mL | Freq: Once | INTRAVENOUS | Status: AC
Start: 2016-08-27 — End: 2016-08-27
  Administered 2016-08-27: 1000 mL via INTRAVENOUS

## 2016-08-27 MED ORDER — LEVOFLOXACIN 500 MG PO TABS
500.0000 mg | ORAL_TABLET | Freq: Once | ORAL | Status: AC
  Administered 2016-08-27: 500 mg via ORAL
  Filled 2016-08-27: qty 1

## 2016-08-27 MED ORDER — HEPARIN SOD (PORK) LOCK FLUSH 100 UNIT/ML IV SOLN
INTRAVENOUS | Status: AC
  Administered 2016-08-27: 21:00:00
  Filled 2016-08-27: qty 5

## 2016-08-27 NOTE — ED Provider Notes (Signed)
[facility] DEPT Provider Note   CSN: [id number] Arrival date & time: 08/27/16  1714     History   Chief Complaint Chief Complaint  Patient presents with  . Fall    HPI Tyler Aguilar is a 60 y.o. male.  Level V caveat for altered mental status. 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 He has had radiation and chemotherapy. Patient fell approximately 1 week ago. He has deteriorated over the past several days with altered mental status. He is not eating or drinking well. No chest pain, fever, sweats, chills. Family is concerned about his falling and possibly bone fractures.      Past Medical History:  Diagnosis Date  . AAA (abdominal aortic aneurysm) (HCC)    4.5 cm December 2016  . Cardiomyopathy ([place])    LVEF 35% February 2017 [facility]  . COPD (chronic obstructive pulmonary disease) ([place])   . Dyslipidemia   . Lung cancer ([place])    Stage III squamous cell, right lung  . PAF (paroxysmal atrial fibrillation) (HCC)    Versus SVT documented February 2017 [facility]  . 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 Bone metastasis ([place]) 06/28/2016  . Hiccups 03/10/2016  . Squamous cell carcinoma of lung, stage IV ([place]) 02/12/2016  . Physical deconditioning 02/12/2016  . Persistent atrial fibrillation ([place]) 02/12/2016  . Squamous cell carcinoma of lung ([place]) 01/09/2016  . COPD (chronic obstructive pulmonary disease) ([place]) 01/03/2016  . Acute respiratory failure with hypoxia ([place]) 01/03/2016  . Elevated troponin 01/03/2016  . Chronic obstructive pulmonary disease with acute exacerbation ([place]) 01/03/2016  . Mediastinal mass 01/03/2016  . History of stroke 01/03/2016  . Acute respiratory failure ([place]) 01/03/2016  . PVD (peripheral vascular disease) ([place]) 01/03/2016  . Smoker 01/03/2016  . Dyslipidemia 01/03/2016  . RBBB 01/03/2016  . Tremor  06/05/2015  . Carotid artery narrowing 05/04/2015  . Current tobacco use 04/28/2014  . 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 Abdominal aortic aneurysm (AAA) without rupture ([place]) 04/28/2014  . Cerebrovascular accident (CVA) ([place]) 04/20/2013  . Spasm 04/20/2013    Past Surgical History:  Procedure Laterality Date  . BRONCHOSCOPY    . COLONOSCOPY  08/31/2012   Procedure: COLONOSCOPY;  Surgeon: Jamesetta So, MD;  Location: [facility] ENDO SUITE;  Service: Gastroenterology;  Laterality: N/A;  . FINGER SURGERY Right 1979  . PORTACATH PLACEMENT Left 02/27/2016   Procedure: INSERTION PORT-A-CATH LEFT SUBCLAVIAN;  Surgeon: Aviva Signs, MD;  Location: [facility] ORS;  Service: General;  Laterality: Left;       Home Medications    Prior to Admission medications   Medication Sig Start Date End Date Taking? Authorizing Provider  aspirin 81 MG tablet Take 81 mg by mouth every other day.    Yes Historical Provider, MD  atorvastatin (LIPITOR) 80 MG tablet Take 80 mg by mouth every evening.   Yes Historical Provider, MD  budesonide-formoterol (SYMBICORT) 160-4.5 MCG/ACT inhaler Inhale 2 puffs into the lungs 2 (two) times daily.   Yes Historical Provider, MD  Calcium Citrate-Vitamin D (CALCIUM + D PO) Take 1 tablet by mouth daily.   Yes Historical Provider, MD  carvedilol (COREG) 3.125 MG tablet Take 1 tablet (3.125 mg total) by mouth 2 (two) times daily. 05/13/16  Yes Arnoldo Lenis, MD  cholecalciferol (VITAMIN D) 1000 units tablet Take  1,000 Units by mouth daily.   Yes Historical Provider, MD  Cyanocobalamin (B-12 PO) Take 1 tablet by mouth daily.   Yes Historical Provider, MD  escitalopram (LEXAPRO) 10 MG tablet Take 1 tablet (10 mg total) by mouth daily. 04/25/16  Yes Manon Hilding Kefalas, PA-C  fentaNYL (DURAGESIC - DOSED MCG/HR) 25 MCG/HR patch Place 1 patch (25 mcg total) onto the skin every 3 (three) days. 08/19/16  Yes Patrici Ranks, MD  lidocaine-prilocaine (EMLA) cream Apply  a quarter size amount to port site 1 hour prior to chemo. Do not rub in. Cover with plastic wrap. 03/04/16  Yes Patrici Ranks, MD  LORazepam (ATIVAN) 1 MG tablet Take 1 tablet (1 mg total) by mouth 3 (three) times daily as needed for anxiety. 08/04/16  Yes Hayden Pedro, PA-C  losartan (COZAAR) 25 MG tablet Take 0.5 tablets (12.5 mg total) by mouth daily. 06/11/16  Yes Arnoldo Lenis, MD  ondansetron (ZOFRAN) 8 MG tablet Take 1 tablet (8 mg total) by mouth as needed. 08/19/16  Yes Patrici Ranks, MD  oxyCODONE (ROXICODONE) 5 MG immediate release tablet Take 1 tablet (5 mg total) by mouth every 6 (six) hours as needed for severe pain. 08/04/16  Yes Hayden Pedro, PA-C  pantoprazole (PROTONIX) 40 MG tablet Take 1 tablet (40 mg total) by mouth daily. 04/25/16  Yes Baird Cancer, PA-C  pyridOXINE (VITAMIN B-6) 50 MG tablet Take 50 mg by mouth daily.   Yes Historical Provider, MD  ranitidine (ZANTAC) 150 MG tablet Take 1 tablet (150 mg total) by mouth 2 (two) times daily. 04/25/16  Yes Baird Cancer, PA-C  rivaroxaban (XARELTO) 20 MG TABS tablet Take 1 tablet (20 mg total) by mouth daily. Patient taking differently: Take 20 mg by mouth every morning.  02/26/16  Yes Patrici Ranks, MD  SPIRIVA RESPIMAT 2.5 MCG/ACT AERS INHALE 2 PUFFS INTO LUNGS DAILY 05/05/16  Yes Baird Cancer, PA-C  levofloxacin (LEVAQUIN) 500 MG tablet Take 1 tablet (500 mg total) by mouth daily. 08/27/16   Patrici Ranks, MD  Spacer/Aero-Holding Chambers (AEROCHAMBER PLUS FLO-VU) MISC  12/20/15   Historical Provider, MD    Family History Family History  Problem Relation Age of Onset  . Diabetes Mellitus II Mother   . Cancer Mother   . Cancer Father   . Coronary artery disease Brother     CABG    Social History Social History  Substance Use Topics  . Smoking status: Former Smoker    Types: Cigarettes    Quit date: 12/31/2015  . Smokeless tobacco: Former Systems developer  . Alcohol use 0.0 oz/week      Allergies   Review of patient's allergies indicates no known allergies.   Review of Systems Review of Systems  Reason unable to perform ROS: Altered mental status.     Physical Exam Updated Vital Signs BP 127/85   Pulse 101   Temp 97.6 F (36.4 C) (Oral)   Resp 25   Ht '5\' 5"'$  (1.651 m)   Wt 124 lb (56.2 kg)   SpO2 98%   BMI 20.63 kg/m   Physical Exam  Constitutional:  Disheveled, dehydrated, alert and oriented 2 (did not know day)  HENT:  Head: Normocephalic and atraumatic.  Eyes: Conjunctivae are normal.  Neck: Neck supple.  Cardiovascular: Normal rate and regular rhythm.   Pulmonary/Chest: Effort normal and breath sounds normal.  Abdominal: Soft. Bowel sounds are normal.  Musculoskeletal:  Tender peri left shoulder, lower  back  Neurological: He is alert.  Skin: Skin is warm and dry.  Psychiatric:  Flat affect.  Nursing note and vitals reviewed.    ED Treatments / Results  Labs (all labs ordered are listed, but only abnormal results are displayed) Labs Reviewed  CBC WITH DIFFERENTIAL/PLATELET - Abnormal; Notable for the following:       Result Value   RBC 2.80 (*)    Hemoglobin 8.5 (*)    HCT 27.2 (*)    Lymphs Abs 0.4 (*)    All other components within normal limits  COMPREHENSIVE METABOLIC PANEL - Abnormal; Notable for the following:    Sodium 132 (*)    Chloride 96 (*)    Creatinine, Ser 0.56 (*)    Calcium 11.1 (*)    Albumin 2.4 (*)    All other components within normal limits  URINALYSIS, ROUTINE W REFLEX MICROSCOPIC (NOT AT [facility])  CBG MONITORING, ED    EKG  EKG Interpretation None       Radiology Dg Chest 2 View  Result Date: 08/27/2016 CLINICAL DATA:  Current history of lung cancer, cough. EXAM: CHEST  2 VIEW COMPARISON:  Radiographs of March 07, 2016. FINDINGS: Stable cardiomediastinal silhouette. Atherosclerosis of thoracic aorta is noted. Left subclavian Port-A-Cath is unchanged in position. No pneumothorax or pleural  effusion is noted. Left lung is clear. Increased nodular densities are noted in right upper lobe concerning for metastatic disease. Increased interstitial densities are noted in the right upper lobe which may represent scarring or atelectasis or possibly infiltrate. Bony thorax is unremarkable. IMPRESSION: Aortic atherosclerosis. Increased nodular densities are noted in right upper lobe concerning for metastatic disease. Increased interstitial densities are also noted in the right upper lobe which may represent scarring or increased atelectasis or possibly infiltrate. Electronically Signed   By: Marijo Conception, M.D.   On: 08/27/2016 19:21   Dg Lumbar Spine Complete  Result Date: 08/27/2016 CLINICAL DATA:  Lower back pain after fall 2 weeks ago. EXAM: LUMBAR SPINE - COMPLETE 4+ VIEW COMPARISON:  CT scan of August 12, 2016. FINDINGS: There is no evidence of lumbar spine fracture. Alignment is normal. Intervertebral disc spaces are maintained. Calcified abdominal aortic aneurysm is again noted. IMPRESSION: No significant abnormality seen in the lumbar spine. Calcified abdominal aortic aneurysm. Electronically Signed   By: Marijo Conception, M.D.   On: 08/27/2016 19:25   Ct Head Wo Contrast  Result Date: 08/27/2016 CLINICAL DATA:  Right-sided facial droop beginning yesterday. Increased confusion. Fall approximately 1 week ago. Personal history of right lung carcinoma. EXAM: CT HEAD WITHOUT CONTRAST TECHNIQUE: Contiguous axial images were obtained from the base of the skull through the vertex without intravenous contrast. COMPARISON:  Brain MRI on 07/21/2016 FINDINGS: Brain: No evidence of acute infarction, hemorrhage, hydrocephalus, extra-axial collection or mass lesion/mass effect. Vascular: No hyperdense vessel or unexpected calcification. Skull: Normal. Negative for fracture or focal lesion. Sinuses/Orbits: No acute finding. Other: None. IMPRESSION: Negative noncontrast head CT. Electronically Signed   By:  Earle Gell M.D.   On: 08/27/2016 19:46   Dg Shoulder Left  Result Date: 08/27/2016 CLINICAL DATA:  Left shoulder pain after fall 2 weeks ago. EXAM: LEFT SHOULDER - 2+ VIEW COMPARISON:  None. FINDINGS: There is no evidence of fracture or dislocation. There is no evidence of arthropathy or other focal bone abnormality. Soft tissues are unremarkable. IMPRESSION: Normal left shoulder. Electronically Signed   By: Marijo Conception, M.D.   On: 08/27/2016 19:22  Procedures Procedures (including critical care time)  Medications Ordered in ED Medications  sodium chloride 0.9 % bolus 1,000 mL (1,000 mLs Intravenous New Bag/Given 08/27/16 1839)  sodium chloride 0.9 % bolus 1,000 mL (1,000 mLs Intravenous New Bag/Given 08/27/16 1839)  morphine 4 MG/ML injection 4 mg (4 mg Intravenous Given 08/27/16 2012)  levofloxacin (LEVAQUIN) tablet 500 mg (500 mg Oral Given 08/27/16 2031)     Initial Impression / Assessment and Plan / ED Course  I have reviewed the triage vital signs and the nursing notes.  Pertinent labs & imaging results that were available during my care of the patient were reviewed by me and considered in my medical decision making (see chart for details).  Clinical Course    Patient feels much better after 2 L IV fluid. Screening tests including labs, CT head, chest x-ray, plain films of left shoulder and lumbar spine show no acute fractures. Patient has lung cancer which is noticeable on his chest x-ray. Oncologist recommended Levaquin for his potential URI. Will start same. Discussed tests with patient and his sister and significant other.  Final Clinical Impressions(s) / ED Diagnoses   Final diagnoses:  Fall, initial encounter  Squamous cell carcinoma of lung, stage IV, unspecified laterality [facility])    New Prescriptions New Prescriptions   No medications on file     Nat Christen, MD 08/27/16 2110

## 2016-08-27 NOTE — ED Triage Notes (Signed)
Family reports pt fell approx 1 week ago. Family states last night pt began having some droop on the R side of his face. Today family states when pt woke he was confused and would not eat or drink. Pt confused at present.

## 2016-08-27 NOTE — Telephone Encounter (Signed)
Spoke with patients sister. Explained Dr.Penland was fine with patient going to [facility] but she did not know of any clinical trials. Also MD said to prescribe Levaquin 500 mg daily. Sister wants it called to [facility] in [place].She states he has really gone downhill in the last five days. He is not eating or drinking. He is confused. Still refuses to let Hospice come in. Sister states he fell last week and is complaining about his hip and leg. she also thinks he may have had a stroke because one side of his mouth is drooping. Encouraged sister to try to get patient to come to ER. She states she will try.

## 2016-08-27 NOTE — Discharge Instructions (Signed)
Tests showed no life-threatening condition. The lung cancer is still visible. Follow-up with [facility] on Monday or return if worse. Increase fluids. Try to eat.

## 2016-08-27 NOTE — ED Notes (Signed)
Patient verbalizes understanding of discharge instructions, home care and follow up care. Patient out of department at this time with family. 

## 2016-09-14 DEATH — deceased

## 2016-10-06 ENCOUNTER — Encounter: Payer: Self-pay | Admitting: *Deleted

## 2016-10-06 ENCOUNTER — Other Ambulatory Visit: Payer: Self-pay | Admitting: *Deleted

## 2016-10-06 NOTE — Patient Outreach (Signed)
[facility]) Care Management  10/06/2016  DANN GALICIA Nov 02, 1956 [id number]  Referral from [facility]);  Telephone call to patient; left message on voice mail requesting return call.  Received immediate call back from Santiago Glad who states she was  patient's sister & power of attorney.  She states that patient passed away September 18, 2016.   Case closed. MD closure letter sent.   Sherrin Daisy, RN BSN [place] Management Coordinator [facility] Care Management  [phone]

## 2016-11-28 ENCOUNTER — Other Ambulatory Visit: Payer: Self-pay | Admitting: Nurse Practitioner

## 2018-01-11 IMAGING — DX DG CHEST 2V
2 series · 2 of 2 positions shown · non-contrast
Comparison: Portable chest x-ray February 27, 2016

CLINICAL DATA: History of hemoptysis and productive cough for the
past 2-3 weeks associated with shortness of breath. Patient has
known non-small cell lung malignancy, former smoker. History of COPD
and cardiomyopathy.

EXAM:
CHEST  2 VIEW

[chest pa]
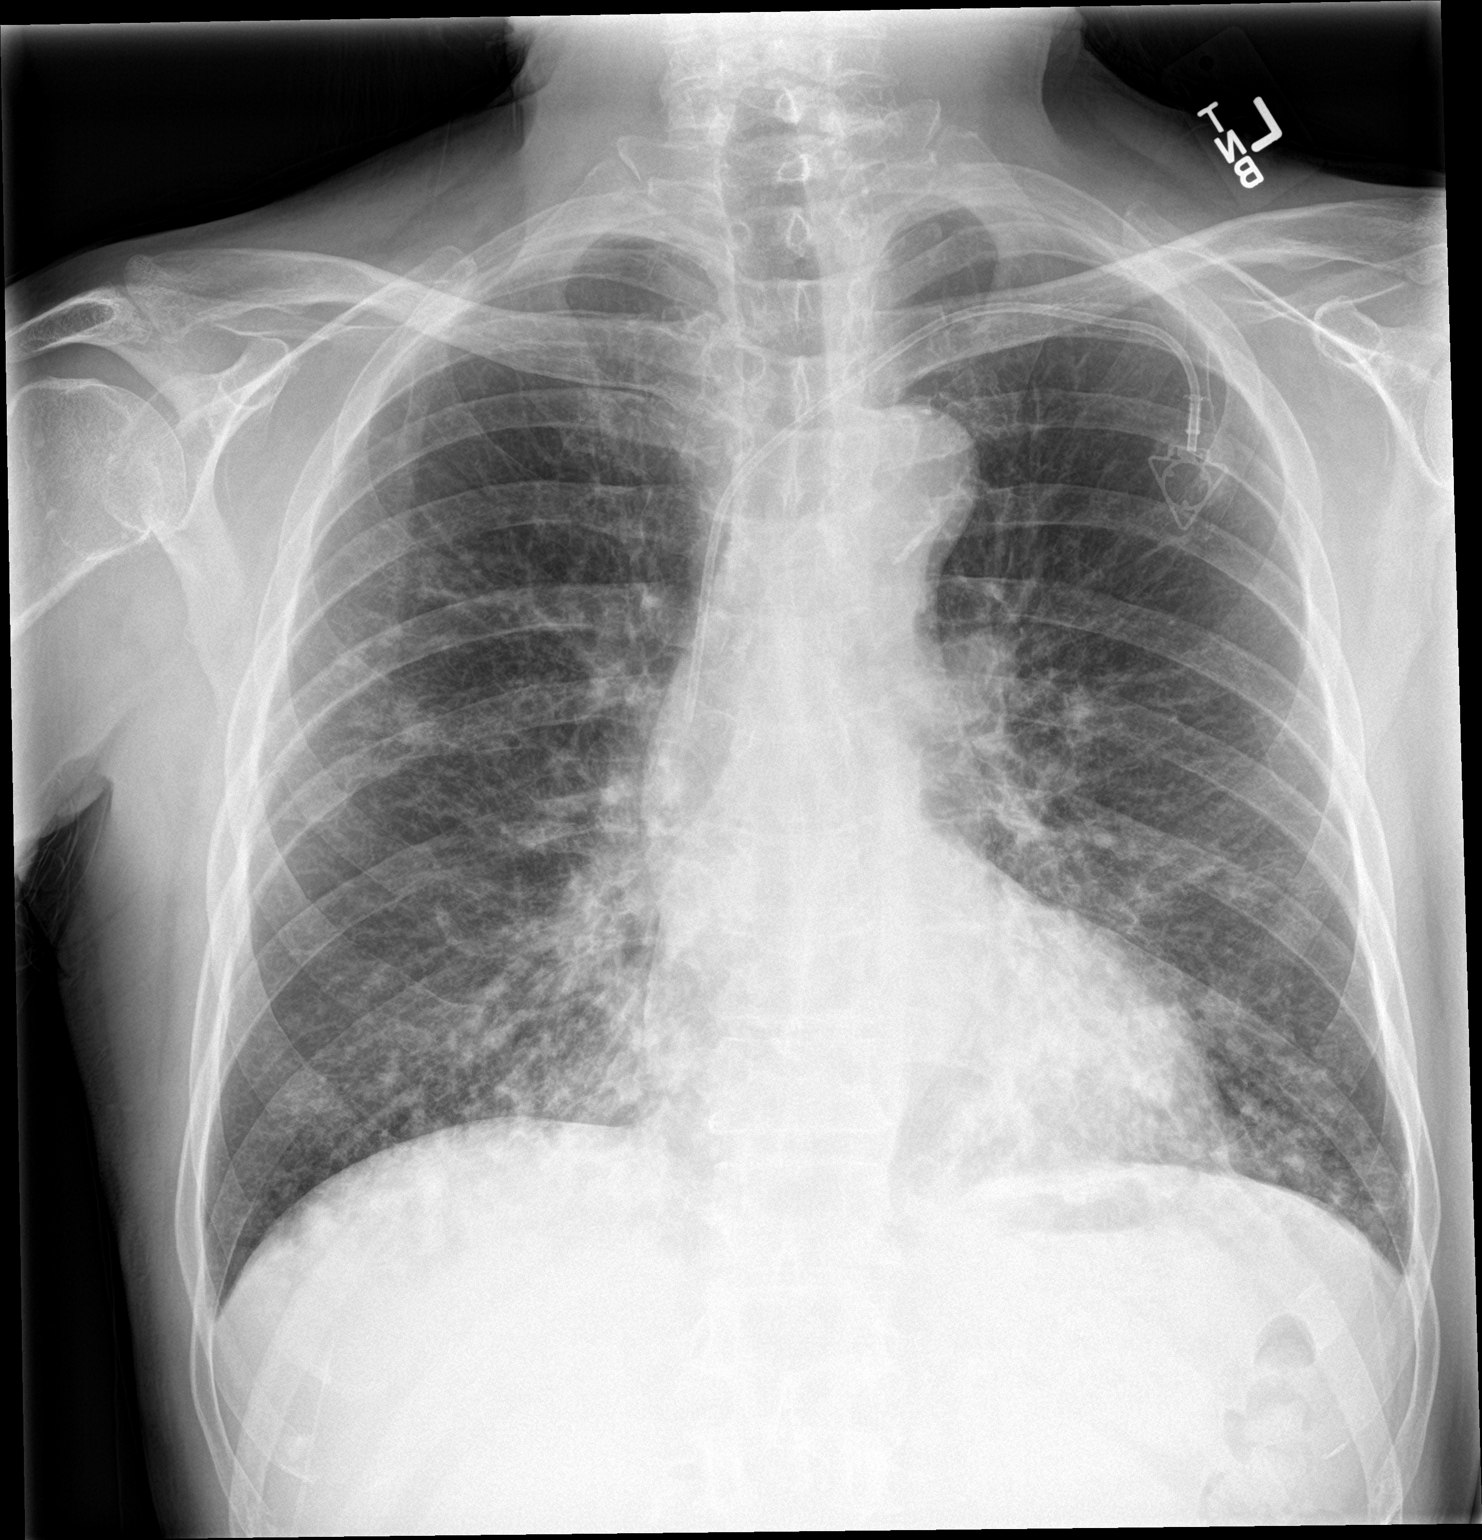

[chest lat]
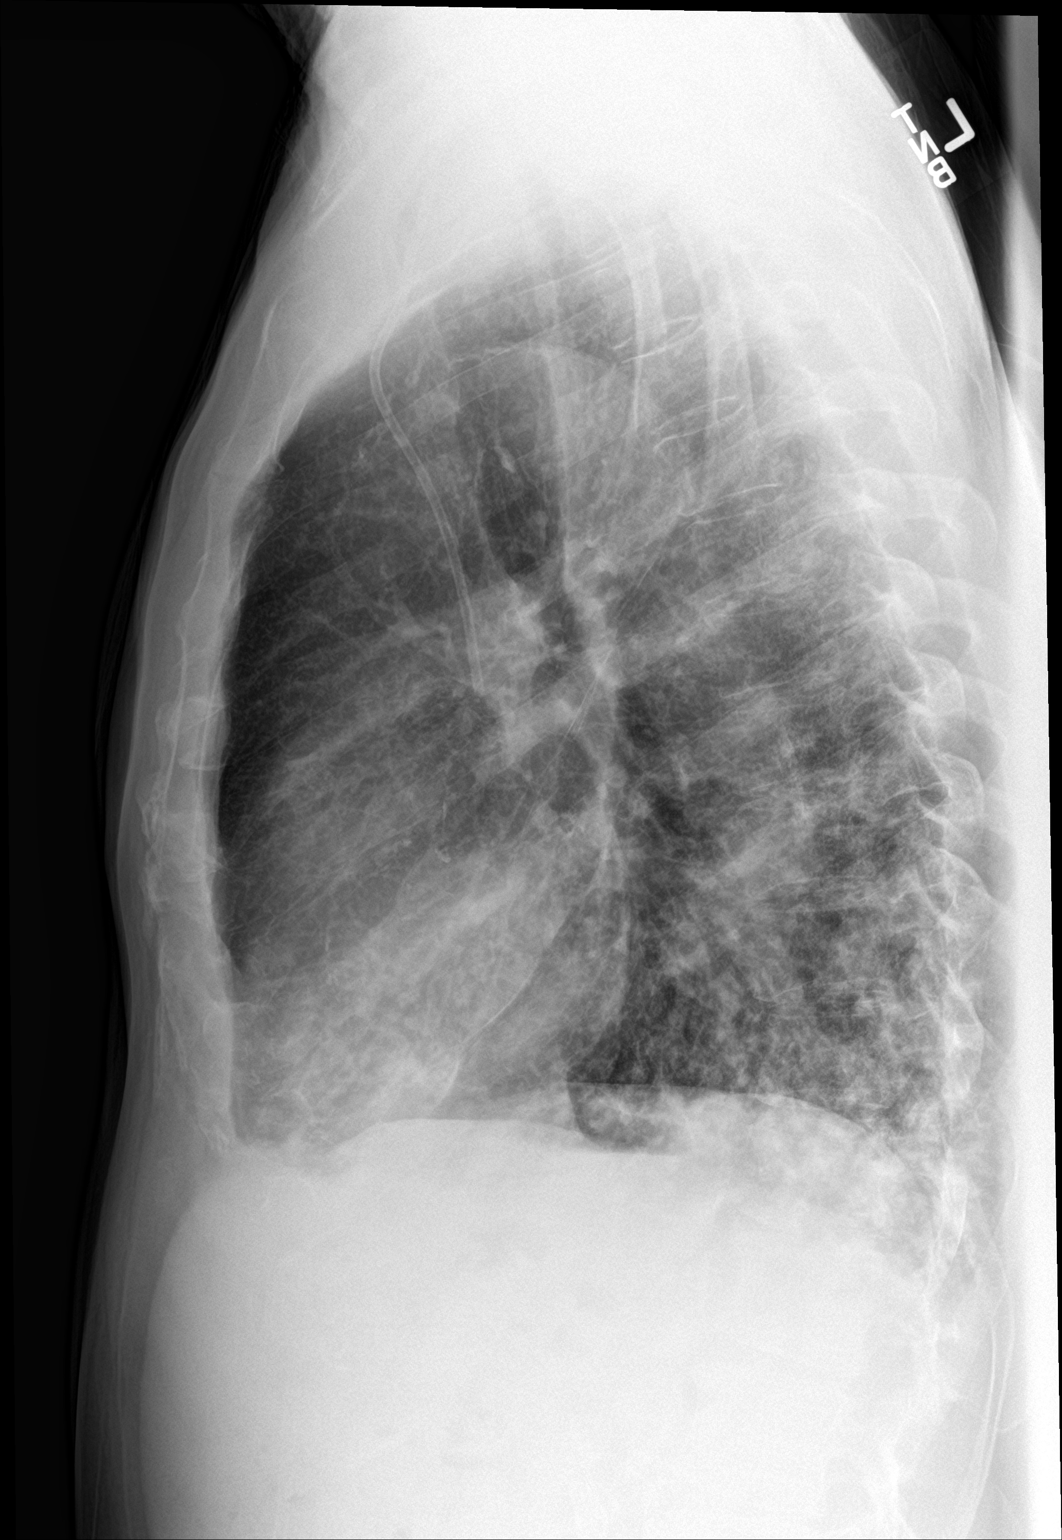

[2 of 2 positions shown; findings below may reference images not displayed]

FINDINGS: The lungs are well-expanded. The interstitial markings are increased
over the previous studies. There are areas of subtle nodular density
in the right mid upper lung and at both lung bases. The heart is
normal in size. The pulmonary vascularity is not engorged. There is
tortuosity of the descending thoracic aorta. The power port catheter
tip projects over the midportion of the SVC.
IMPRESSION: Abnormally increased interstitial densities bilaterally with subtle
areas of nodularity which have become more conspicuous since the
study February 27, 2016. The findings may be infectious or
neoplastic. Chest CT scanning is recommended.

## 2018-04-05 IMAGING — DX DG PELVIS 1-2V
1 series · 1 of 1 positions shown · non-contrast
Comparison: Bone scan 05/29/2016.  CT 02/19/2010.

CLINICAL DATA: Hip pain.  Abnormal bone scan.  Lung cancer.

EXAM:
PELVIS - 1-2 VIEW

[pelvis ap]
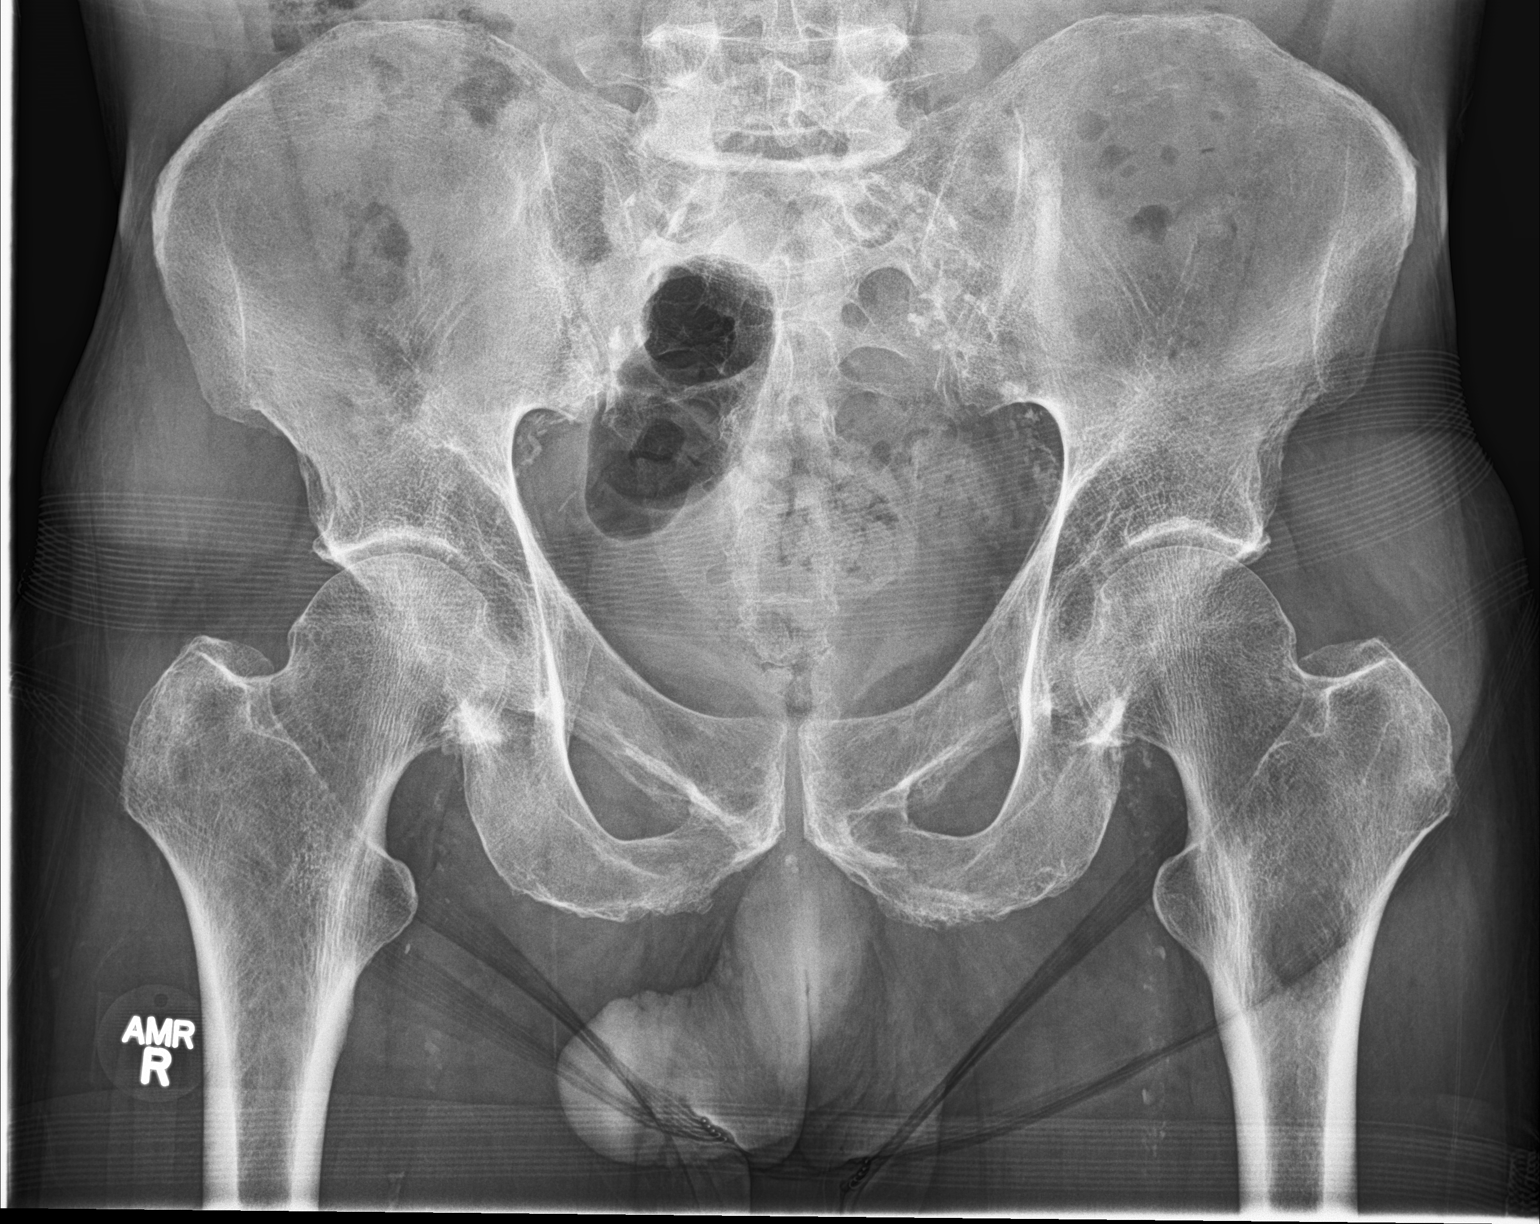

[1 of 1 positions shown; findings below may reference images not displayed]

FINDINGS: Large lytic lesion noted in the left iliac wing and proximal left
femur consistent with metastatic disease. No other focal
abnormalities identified. Aortoiliac atherosclerotic vascular
disease.
IMPRESSION: Large lytic lesions in the left iliac wing and proximal left femur
consistent with metastatic disease.

## 2018-05-02 IMAGING — MR MR THORACIC SPINE WO/W CM
5 of 7 series · 33 of 48 positions shown · IV contrast (Multihance 13ml)
Comparison: PET 06/10/2016, CT chest 04/29/2016

CLINICAL DATA: Lung cancer with metastatic disease to spine.
Stereotactic radiosurgery planning.

EXAM:
MRI THORACIC SPINE WITHOUT AND WITH CONTRAST
TECHNIQUE: Multiplanar and multiecho pulse sequences of the thoracic spine were
obtained without and with intravenous contrast.
CONTRAST:  13mL MULTIHANCE GADOBENATE DIMEGLUMINE 529 MG/ML IV SOLN

[Series 5: T1 · sagittal · 3.0mm · 1.00mm/px · 4 of 15 slices shown (1 of 2)]
[im 1/15]
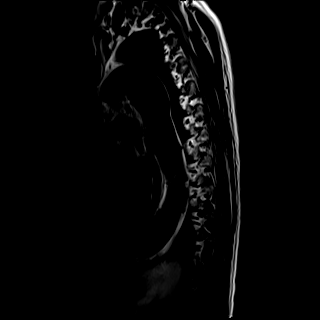
[im 5/15]
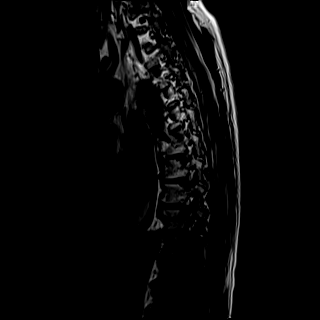
[im 10/15]
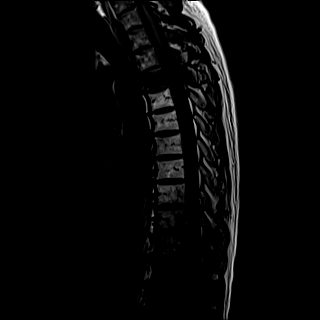
[im 15/15]
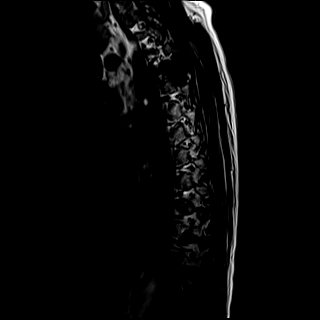

[Series 6: STIR · sagittal · 3.0mm · 0.67mm/px · 2 of 15 slices shown]
[im 1/15]
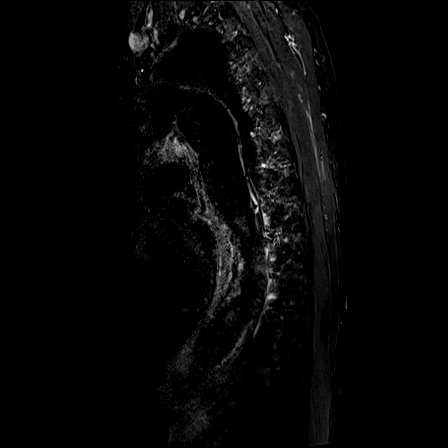
[im 5/15]
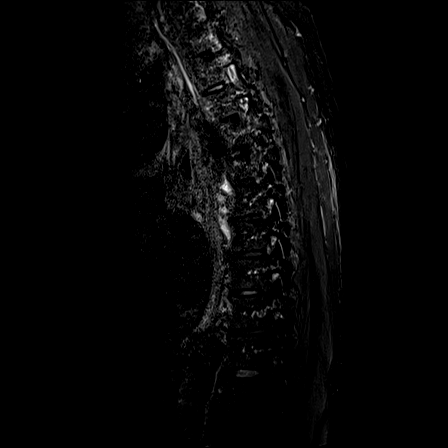

[Series 7: T1 · axial · non-contrast · 2.0mm · 0.39mm/px · z∈[-127,-49]mm · 11 of 40 slices shown (2 of 2)]
[im 1/40]
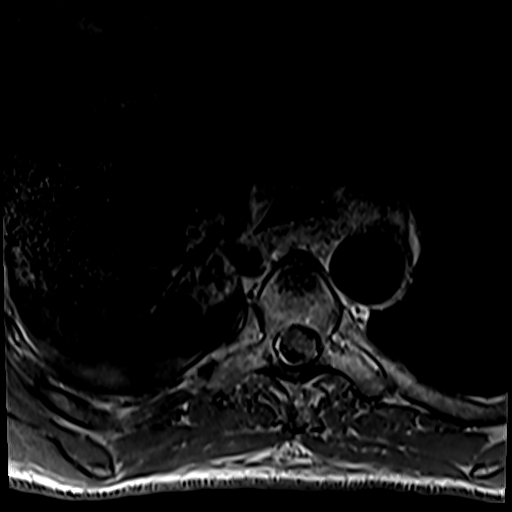
[im 4/40]
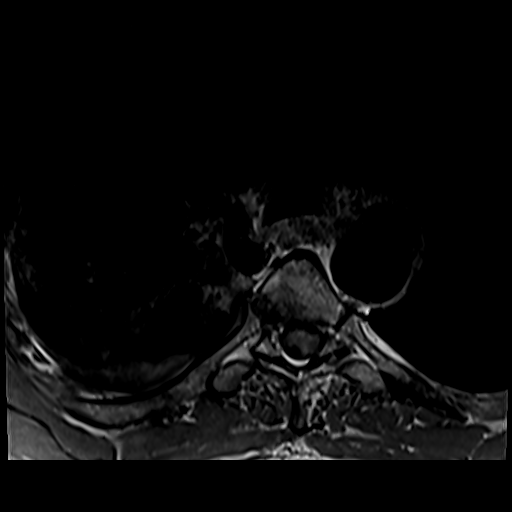
[im 8/40]
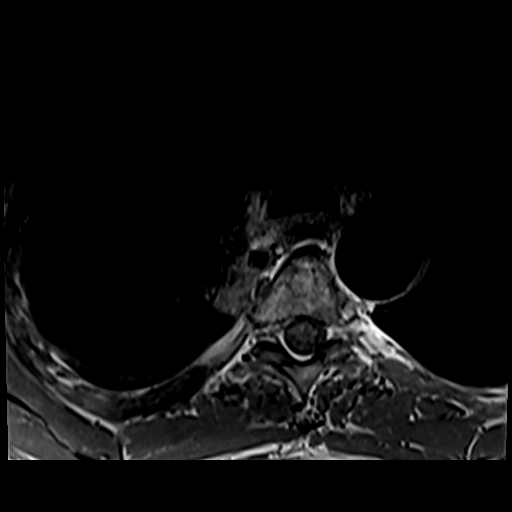
[im 12/40]
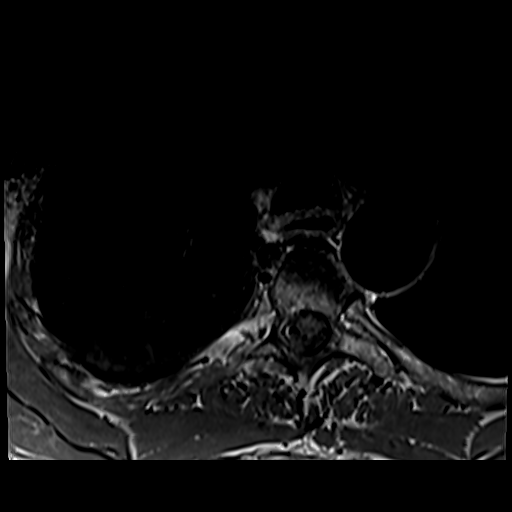
[im 16/40]
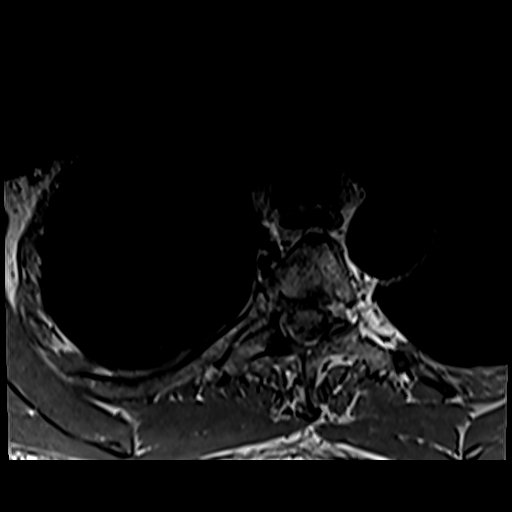
[im 20/40]
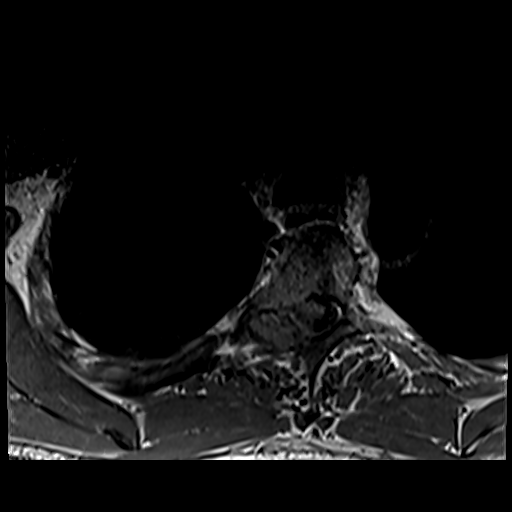
[im 24/40]
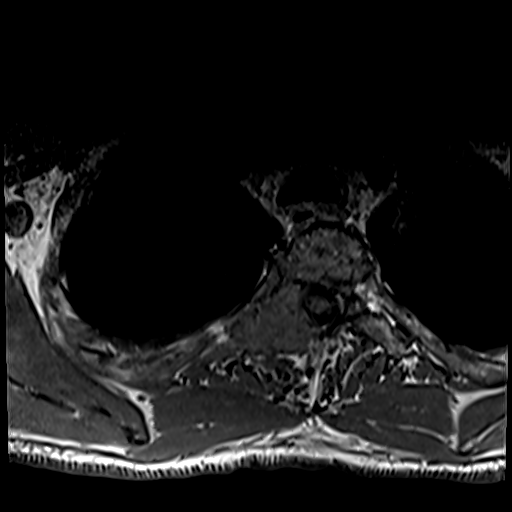
[im 28/40]
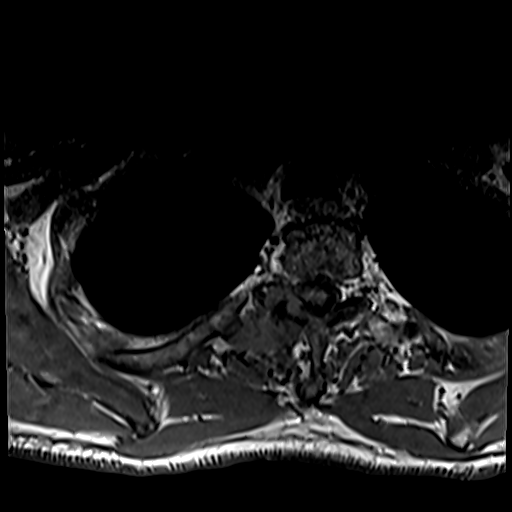
[im 32/40]
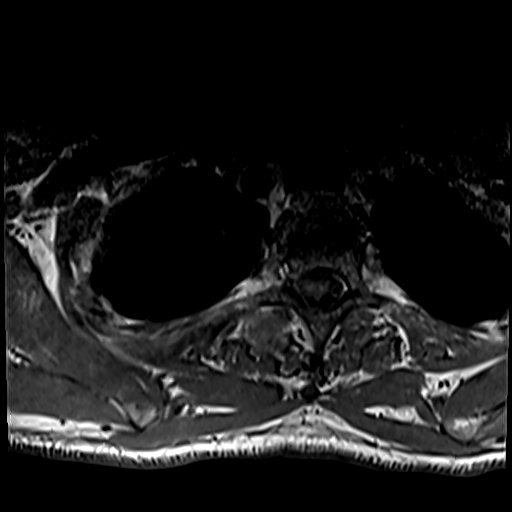
[im 36/40]
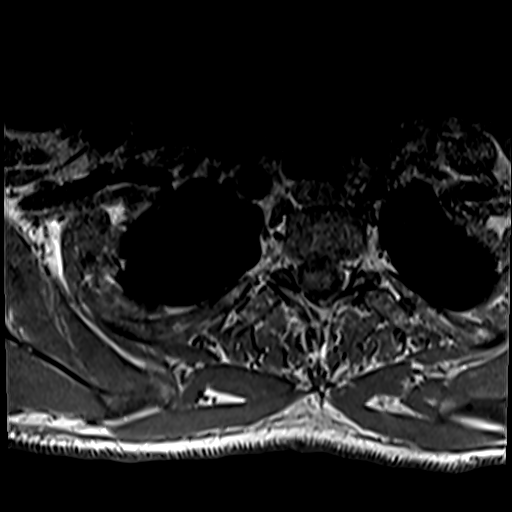
[im 40/40]
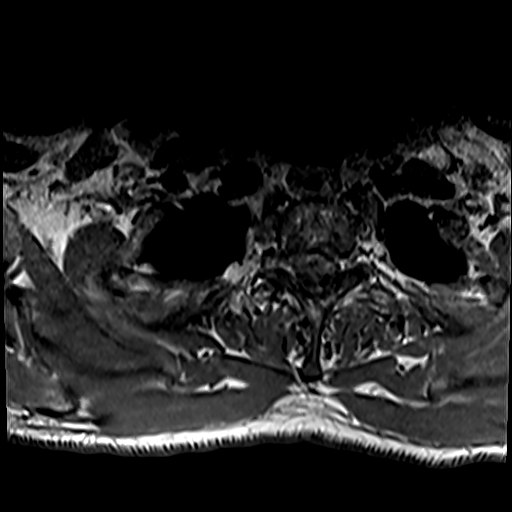

[Series 8: T2 · axial · 2.0mm · 0.78mm/px · z∈[-127,-49]mm · 11 of 40 slices shown (1 of 2)]
[im 1/40]
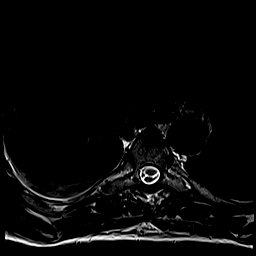
[im 4/40]
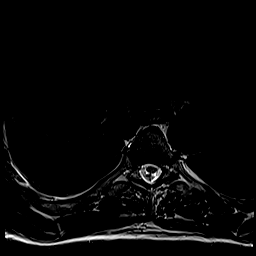
[im 8/40]
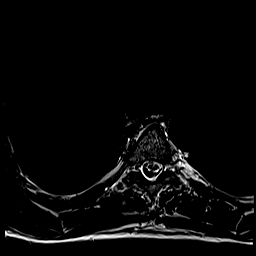
[im 12/40]
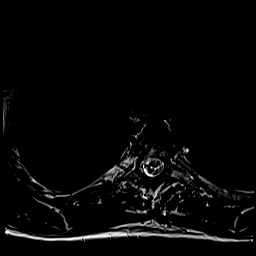
[im 16/40]
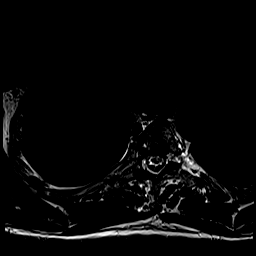
[im 20/40]
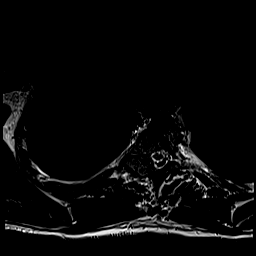
[im 24/40]
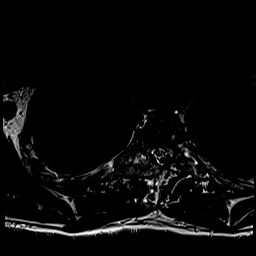
[im 28/40]
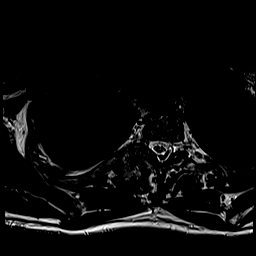
[im 32/40]
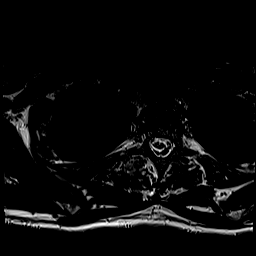
[im 36/40]
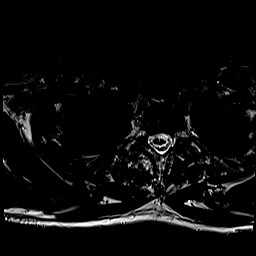
[im 40/40]
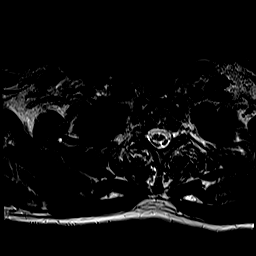

[Series 9: T2 · sagittal · 3.0mm · 1.00mm/px · 5 of 19 slices shown (2 of 2)]
[im 1/19]
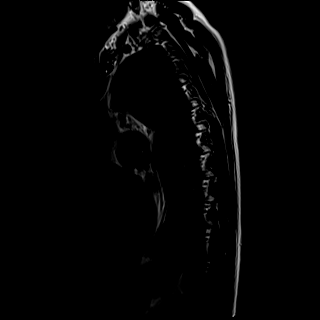
[im 5/19]
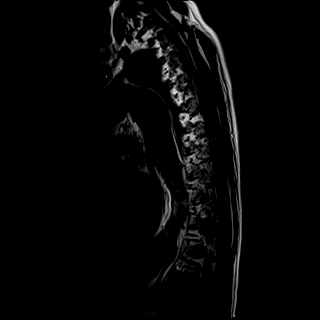
[im 10/19]
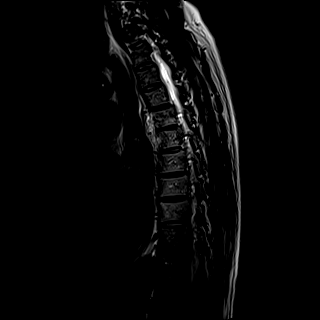
[im 14/19]
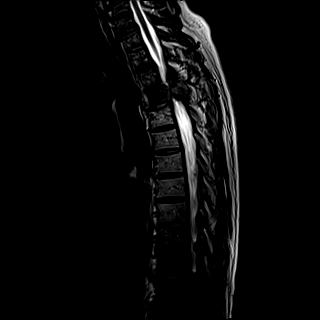
[im 19/19]
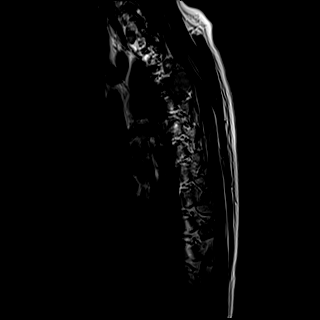

[33 of 48 positions shown; findings below may reference images not displayed]

FINDINGS: Metastatic disease right T4 vertebra involving the right vertebral
body, pedicle, lamina, transverse process, and adjacent soft
tissues. The mass measures approximately 32 x 37 mm. There is mild
tumor in the lateral epidural space on the right. No cord
compression. Spinal cord signal is normal.

No other metastatic deposits identified.  No fracture identified.

Central disc protrusion at T10-11 without significant spinal
stenosis. Remaining disc spaces normal.
IMPRESSION: Metastatic disease to T4 vertebral body on the right with
progression since 04/29/2016. There is mild tumor in the lateral
epidural space on the right without cord compression. No other
metastatic disease.
